# Patient Record
Sex: Female | Born: 1985 | Race: Black or African American | Hispanic: No | Marital: Single | Smoking: Never smoker
Health system: Southern US, Community
[De-identification: ages and names within clinical notes are randomized; demographics above are authoritative.]

## PROBLEM LIST (undated history)

## (undated) ENCOUNTER — Inpatient Hospital Stay (HOSPITAL_COMMUNITY): Payer: Self-pay

## (undated) DIAGNOSIS — R599 Enlarged lymph nodes, unspecified: Secondary | ICD-10-CM

## (undated) DIAGNOSIS — D649 Anemia, unspecified: Secondary | ICD-10-CM

## (undated) DIAGNOSIS — D219 Benign neoplasm of connective and other soft tissue, unspecified: Secondary | ICD-10-CM

## (undated) DIAGNOSIS — L0291 Cutaneous abscess, unspecified: Secondary | ICD-10-CM

## (undated) DIAGNOSIS — R519 Headache, unspecified: Secondary | ICD-10-CM

## (undated) DIAGNOSIS — R51 Headache: Secondary | ICD-10-CM

## (undated) DIAGNOSIS — N809 Endometriosis, unspecified: Secondary | ICD-10-CM

## (undated) DIAGNOSIS — D573 Sickle-cell trait: Secondary | ICD-10-CM

## (undated) DIAGNOSIS — R1904 Left lower quadrant abdominal swelling, mass and lump: Secondary | ICD-10-CM

## (undated) HISTORY — DX: Endometriosis, unspecified: N80.9

## (undated) HISTORY — DX: Cutaneous abscess, unspecified: L02.91

## (undated) HISTORY — DX: Headache: R51

## (undated) HISTORY — DX: Enlarged lymph nodes, unspecified: R59.9

## (undated) HISTORY — DX: Headache, unspecified: R51.9

## (undated) HISTORY — DX: Left lower quadrant abdominal swelling, mass and lump: R19.04

---

## 2006-07-03 HISTORY — PX: MYOMECTOMY: SHX85

## 2009-04-02 HISTORY — PX: APPENDECTOMY: SHX54

## 2011-07-31 ENCOUNTER — Encounter (INDEPENDENT_AMBULATORY_CARE_PROVIDER_SITE_OTHER): Payer: Self-pay | Admitting: Surgery

## 2011-07-31 ENCOUNTER — Ambulatory Visit (INDEPENDENT_AMBULATORY_CARE_PROVIDER_SITE_OTHER): Payer: Managed Care, Other (non HMO) | Admitting: Surgery

## 2011-07-31 VITALS — BP 100/64 | HR 62 | Temp 97.8°F | Resp 16 | Ht 65.75 in | Wt 137.8 lb

## 2011-07-31 DIAGNOSIS — N809 Endometriosis, unspecified: Secondary | ICD-10-CM

## 2011-07-31 DIAGNOSIS — R1904 Left lower quadrant abdominal swelling, mass and lump: Secondary | ICD-10-CM

## 2011-07-31 HISTORY — DX: Endometriosis, unspecified: N80.9

## 2011-07-31 HISTORY — DX: Left lower quadrant abdominal swelling, mass and lump: R19.04

## 2011-07-31 NOTE — Patient Instructions (Signed)
We suspect your have a mass of Endometriosis in your abdominal wall  Endometriosis is a disease that occurs when the endometrium (lining of the uterus) is misplaced outside of its normal location. It may occur in many locations close to the uterus (womb), but commonly on the ovaries, fallopian tubes, vagina (birth canal) and bowel located close to the uterus. Because the uterus sloughs (expels) its lining every month (menses), there is bleeding whereever the endometrial tissue is located. SYMPTOMS  Often there are no symptoms. However, because blood is irritating to tissues not normally exposed to it, when symptoms occur they vary with the location of the misplaced endometrium. Symptoms often include back and abdominal pain. Periods may be heavier and intercourse may be painful. Infertility may be present. You may have all of these symptoms at one time or another or you may have months with no symptoms at all. Although the symptoms occur mainly during menses, they can occur mid-cycle as well, and usually terminate with menopause. DIAGNOSIS  Your caregiver may recommend a blood test and urine test (urinalysis) to help rule out other conditions. Another common test is ultrasound, a painless procedure that uses sound waves to make a sonogram "picture" of abnormal tissue that could be endometriosis. If your bowel movements are painful around your periods, your caregiver may advise a barium enema (an X-ray of the lower bowel), to try to find the source of your pain. This is sometimes confirmed by laparoscopy. Laparoscopy is a procedure where your caregiver looks into your abdomen with a laparoscope (a small pencil sized telescope). Your caregiver may take a tiny piece of tissue (biopsy) from any abnormal tissue to confirm or document your problem. These tissues are sent to the lab and a pathologist looks at them under the microscope to give a microscopic diagnosis. TREATMENT  Once the diagnosis is made, it can be  treated by destruction of the misplaced endometrial tissue using heat (diathermy), laser, cutting (excision), or chemical means. It may also be treated with hormonal therapy. When using hormonal therapy menses are eliminated, therefore eliminating the monthly exposure to blood by the misplaced endometrial tissue. Only in severe cases is it necessary to perform a hysterectomy with removal of the tubes, uterus and ovaries. HOME CARE INSTRUCTIONS   Only take over-the-counter or prescription medicines for pain, discomfort, or fever as directed by your caregiver.   Avoid activities that produce pain, including a physical sexual relationship.   Do not take aspirin as this may increase bleeding when not on hormonal therapy.   See your caregiver for pain or problems not controlled with treatment.  SEEK IMMEDIATE MEDICAL CARE IF:   Your pain is severe and is not responding to pain medicine.   You develop severe nausea and vomiting, or you cannot keep foods down.   Your pain localizes to the right lower part of your abdomen (possible appendicitis).   You have swelling or increasing pain in the abdomen.   You have a fever.   You see blood in your stool.  MAKE SURE YOU:   Understand these instructions.   Will watch your condition.   Will get help right away if you are not doing well or get worse.  Document Released: 06/07/2000 Document Revised: 02/20/2011 Document Reviewed: 01/27/2008 [facility] Patient Information 2012 [place], [place].

## 2011-07-31 NOTE — Progress Notes (Signed)
Subjective:     Patient ID: Megan Coleman, female   DOB: 03/09/1986, 25 y.o.   MRN: [id number]  HPI  Megan Coleman  12/09/1985 [id number]  Patient Care Team: Cammie J Fulp as PCP - General (Family Medicine)  This patient is a 26 y.o.female who presents today for surgical evaluation at the request of Dr. Fulp.   Reason for visit: Painful mass on the left lower quadrant of abdomen.  The patient is a healthy thin female originally from [place]. She's been here for the past year. She notes she felt a lump in her left lower side a few years ago. Never had any drainage or infection. Has gradually increased in size. It is worse with activity and sneezing. It is worse with her menstrual periods. She  She did have a removal of a fibroid and she was [place]. She had an appendectomy through a separate right lower quadrant incision. No fevers or chills. Energy level otherwise good. No menstrual bleeding. No fevers or chills. No history of hernias. No history of wound infections.  Because the mass had gotten larger become more comfortable, she decided to come in and be seen. Dr. Fulp was concerned and requested a surgical evaluation  Patient Active Problem List  Diagnoses  . Abdominal wall mass of left lower quadrant, possible endometrioma    Past Medical History  Diagnosis Date  . Abscess     lower left abdomen/groin area  . Abdominal pain   . Generalized headaches   . Lymph nodes enlarged   . Abdominal wall mass of left lower quadrant, possible endometrioma 07/31/2011    Past Surgical History  Procedure Date  . Appendectomy 04/02/2009    In [place]  . Myomectomy 07/03/2006    In [place]    History   Social History  . Marital Status: Single    Spouse Name: N/A    Number of Children: N/A  . Years of Education: N/A   Occupational History  . Not on file.   Social History Main Topics  . Smoking status: Never Smoker   . Smokeless tobacco: Never Used    . Alcohol Use: No  . Drug Use: No  . Sexually Active: Not on file   Other Topics Concern  . Not on file   Social History Narrative   Originally from [place], [place]    History reviewed. No pertinent family history.  No current outpatient prescriptions on file.  No Known Allergies  BP 100/64  Pulse 62  Temp(Src) 97.8 F (36.6 C) (Temporal)  Resp 16  Ht 5' 5.75" (1.67 m)  Wt 137 lb 12.8 oz (62.506 kg)  BMI 22.41 kg/m2     Review of Systems  Constitutional: Negative for fever, chills, diaphoresis, appetite change and fatigue.  HENT: Negative for ear pain, sore throat, trouble swallowing, neck pain and ear discharge.   Eyes: Negative for photophobia, discharge and visual disturbance.  Respiratory: Negative for cough, choking, chest tightness and shortness of breath.   Cardiovascular: Negative for chest pain and palpitations.  Gastrointestinal: Positive for abdominal pain. Negative for nausea, vomiting, diarrhea, constipation, blood in stool, abdominal distention, anal bleeding and rectal pain.  Genitourinary: Negative for dysuria, frequency and difficulty urinating.  Musculoskeletal: Negative for myalgias and gait problem.  Skin: Negative for color change, pallor and rash.  Neurological: Negative for dizziness, speech difficulty, weakness and numbness.  Hematological: Negative for adenopathy.  Psychiatric/Behavioral: Negative for confusion and agitation. The patient is not   nervous/anxious.        Objective:   Physical Exam  Constitutional: She is oriented to person, place, and time. She appears well-developed and well-nourished. No distress.  HENT:  Head: Normocephalic.  Mouth/Throat: Oropharynx is clear and moist. No oropharyngeal exudate.  Eyes: Conjunctivae and EOM are normal. Pupils are equal, round, and reactive to light. No scleral icterus.  Neck: Normal range of motion. Neck supple. No tracheal deviation present.  Cardiovascular: Normal rate, regular  rhythm and intact distal pulses.   Pulmonary/Chest: Effort normal and breath sounds normal. No respiratory distress. She exhibits no tenderness.  Abdominal: Soft. She exhibits mass. She exhibits no distension. There is tenderness. There is no rebound and no guarding. Hernia confirmed negative in the right inguinal area and confirmed negative in the left inguinal area.    Genitourinary: No vaginal discharge found.  Musculoskeletal: Normal range of motion. She exhibits no tenderness.  Lymphadenopathy:    She has no cervical adenopathy.       Right: No inguinal adenopathy present.       Left: No inguinal adenopathy present.  Neurological: She is alert and oriented to person, place, and time. No cranial nerve deficit. She exhibits normal muscle tone. Coordination normal.  Skin: Skin is warm and dry. No rash noted. She is not diaphoretic. No erythema.  Psychiatric: She has a normal mood and affect. Her behavior is normal. Judgment and thought content normal.       Assessment:     LLQ abdominal wall mass.  Probable endometrioma vs incarcerated VWH    Plan:     I would start with diagnostic laparoscopy to rule out an incarcerated hernia and no other pathology. If it is negative as is suspect, then I would proceed with cutdown on the mass and removal. Given that it is firm, I suspect some fascia / muscle will need to be removed and she will need abdominal reconstruction with mesh if it turns out to be an endometrioma or similar mass.  The pathophysiology of skin & abdominal wall masses was discussed.  Natural history risks without surgery were discussed.  I recommended surgery to remove the mass.  I explained the technique of removal with use of local anesthesia & more aggressive sedation/anesthesia for patient comfort.   Possible abd wall reconstruction w mesh discussed as well if fascia has to be removed.  Risks such as bleeding, infection, heart attack, death, and other risks were discussed.  I  noted a good likelihood this will help address the problem.   Possibility that this will not correct all symptoms was explained. Possibility of regrowth/recurrence of the mass was discussed.  We will work to minimize complications. Questions were answered.  The patient expresses understanding & wishes to proceed with surgery.          

## 2011-08-12 ENCOUNTER — Ambulatory Visit (HOSPITAL_COMMUNITY)
Admission: RE | Admit: 2011-08-12 | Discharge: 2011-08-12 | Disposition: A | Discharge status: Home / Self Care | Payer: Managed Care, Other (non HMO) | Source: Ambulatory Visit | Attending: Surgery | Admitting: Surgery

## 2011-08-12 ENCOUNTER — Encounter (HOSPITAL_COMMUNITY): Payer: Self-pay | Admitting: Pharmacy Technician

## 2011-08-12 ENCOUNTER — Encounter (HOSPITAL_COMMUNITY): Payer: Self-pay

## 2011-08-12 DIAGNOSIS — R1903 Right lower quadrant abdominal swelling, mass and lump: Secondary | ICD-10-CM | POA: Insufficient documentation

## 2011-08-12 DIAGNOSIS — Z01812 Encounter for preprocedural laboratory examination: Secondary | ICD-10-CM | POA: Insufficient documentation

## 2011-08-12 DIAGNOSIS — R1904 Left lower quadrant abdominal swelling, mass and lump: Secondary | ICD-10-CM | POA: Insufficient documentation

## 2011-08-12 LAB — CBC
Hemoglobin: 13.2 g/dL (ref 12.0–15.0)
MCH: 27 pg (ref 26.0–34.0)
MCHC: 33.4 g/dL (ref 30.0–36.0)
Platelets: 189 10*3/uL (ref 150–400)
RDW: 13.5 % (ref 11.5–15.5)

## 2011-08-12 LAB — HCG, SERUM, QUALITATIVE: Preg, Serum: NEGATIVE

## 2011-08-12 LAB — SURGICAL PCR SCREEN
MRSA, PCR: NEGATIVE
Staphylococcus aureus: POSITIVE — AB

## 2011-08-12 NOTE — Patient Instructions (Signed)
21 Megan Coleman  08/12/2011   Your procedure is scheduled on: 08-15-11  Report to [facility] at  0530 AM.  Call this number if you have problems the morning of surgery: [phone]   Remember:   Do not eat food or drink liquids:After Midnight.  Take these medicines the morning of surgery with A SIP OF WATER: no meds to take   Do not wear jewelry...  Do not wear lotions, powders, or perfumes. Do not wear deodorant.  .  Do not bring valuables to the hospital.  Contacts, dentures or bridgework may not be worn into surgery.  Leave suitcase in the car. After surgery it may be brought to your room.  For patients admitted to the hospital, checkout time is 11:00 AM the day of discharge.     Special Instructions: CHG Shower Use Special Wash: 1/2 bottle night before surgery and 1/2 bottle morning of surgery.neck down avoid private area,  No more shaving   Please read over the following fact sheets that you were given: MRSA Information  [name] [name] rn [facility] pre op nurse phone number [phone] call if needed

## 2011-08-28 ENCOUNTER — Encounter (HOSPITAL_COMMUNITY): Payer: Self-pay | Admitting: Certified Registered Nurse Anesthetist

## 2011-08-28 ENCOUNTER — Encounter (HOSPITAL_COMMUNITY)
Admission: RE | Disposition: A | Discharge status: Home / Self Care | Payer: Self-pay | Source: Ambulatory Visit | Attending: Surgery

## 2011-08-28 ENCOUNTER — Ambulatory Visit (HOSPITAL_COMMUNITY)
Admission: RE | Admit: 2011-08-28 | Discharge: 2011-08-29 | Disposition: A | Discharge status: Home / Self Care | Payer: Managed Care, Other (non HMO) | Source: Ambulatory Visit | Attending: Surgery | Admitting: Surgery

## 2011-08-28 ENCOUNTER — Ambulatory Visit (HOSPITAL_COMMUNITY): Payer: Managed Care, Other (non HMO) | Admitting: Certified Registered Nurse Anesthetist

## 2011-08-28 DIAGNOSIS — R1901 Right upper quadrant abdominal swelling, mass and lump: Secondary | ICD-10-CM | POA: Insufficient documentation

## 2011-08-28 DIAGNOSIS — R1032 Left lower quadrant pain: Secondary | ICD-10-CM | POA: Insufficient documentation

## 2011-08-28 DIAGNOSIS — N808 Other endometriosis: Secondary | ICD-10-CM | POA: Insufficient documentation

## 2011-08-28 DIAGNOSIS — D214 Benign neoplasm of connective and other soft tissue of abdomen: Secondary | ICD-10-CM | POA: Insufficient documentation

## 2011-08-28 DIAGNOSIS — R1904 Left lower quadrant abdominal swelling, mass and lump: Secondary | ICD-10-CM | POA: Insufficient documentation

## 2011-08-28 DIAGNOSIS — K439 Ventral hernia without obstruction or gangrene: Secondary | ICD-10-CM

## 2011-08-28 DIAGNOSIS — N806 Endometriosis in cutaneous scar: Secondary | ICD-10-CM

## 2011-08-28 DIAGNOSIS — N809 Endometriosis, unspecified: Secondary | ICD-10-CM | POA: Diagnosis present

## 2011-08-28 HISTORY — PX: LAPAROSCOPY: SHX197

## 2011-08-28 HISTORY — PX: MASS EXCISION: SHX2000

## 2011-08-28 HISTORY — PX: VENTRAL HERNIA REPAIR: SHX424

## 2011-08-28 LAB — CBC
MCH: 25.7 pg — ABNORMAL LOW (ref 26.0–34.0)
MCHC: 31.9 g/dL (ref 30.0–36.0)
MCV: 80.6 fL (ref 78.0–100.0)
Platelets: 170 10*3/uL (ref 150–400)
RBC: 3.77 MIL/uL — ABNORMAL LOW (ref 3.87–5.11)

## 2011-08-28 LAB — CREATININE, SERUM: Creatinine, Ser: 0.72 mg/dL (ref 0.50–1.10)

## 2011-08-28 SURGERY — LAPAROSCOPY, DIAGNOSTIC
Anesthesia: General | Site: Abdomen | Wound class: Clean

## 2011-08-28 MED ORDER — ACETAMINOPHEN 10 MG/ML IV SOLN
INTRAVENOUS | Status: DC | PRN
  Administered 2011-08-28: 1000 mg via INTRAVENOUS

## 2011-08-28 MED ORDER — LACTATED RINGERS IV SOLN
INTRAVENOUS | Status: DC | PRN
  Administered 2011-08-28: 09:00:00 via INTRAVENOUS

## 2011-08-28 MED ORDER — BUPIVACAINE-EPINEPHRINE PF 0.25-1:200000 % IJ SOLN
INTRAMUSCULAR | Status: AC
  Filled 2011-08-28: qty 30

## 2011-08-28 MED ORDER — MAGNESIUM HYDROXIDE 400 MG/5ML PO SUSP: 30.0000 mL | Freq: Three times a day (TID) | ORAL | Status: DC | PRN

## 2011-08-28 MED ORDER — KETOROLAC TROMETHAMINE 30 MG/ML IJ SOLN: 15.0000 mg | Freq: Once | INTRAMUSCULAR | Status: DC | PRN

## 2011-08-28 MED ORDER — ALUM & MAG HYDROXIDE-SIMETH 200-200-20 MG/5ML PO SUSP: 30.0000 mL | Freq: Four times a day (QID) | ORAL | Status: DC | PRN

## 2011-08-28 MED ORDER — CEFAZOLIN SODIUM 1-5 GM-% IV SOLN
1.0000 g | INTRAVENOUS | Status: AC
  Administered 2011-08-28: 1 g via INTRAVENOUS

## 2011-08-28 MED ORDER — SUCCINYLCHOLINE CHLORIDE 20 MG/ML IJ SOLN
INTRAMUSCULAR | Status: DC | PRN
  Administered 2011-08-28: 100 mg via INTRAVENOUS

## 2011-08-28 MED ORDER — HEPARIN SODIUM (PORCINE) 5000 UNIT/ML IJ SOLN
5000.0000 [IU] | Freq: Three times a day (TID) | INTRAMUSCULAR | Status: DC
  Administered 2011-08-28 – 2011-08-29 (×3): 5000 [IU] via SUBCUTANEOUS
  Filled 2011-08-28 (×8): qty 1

## 2011-08-28 MED ORDER — ONDANSETRON HCL 4 MG/2ML IJ SOLN
INTRAMUSCULAR | Status: DC | PRN
  Administered 2011-08-28: 4 mg via INTRAVENOUS

## 2011-08-28 MED ORDER — IBUPROFEN 800 MG PO TABS
800.0000 mg | ORAL_TABLET | Freq: Four times a day (QID) | ORAL | Status: DC
  Administered 2011-08-28 – 2011-08-29 (×5): 800 mg via ORAL
  Filled 2011-08-28 (×10): qty 1

## 2011-08-28 MED ORDER — PROMETHAZINE HCL 25 MG/ML IJ SOLN: 6.2500 mg | INTRAMUSCULAR | Status: DC | PRN

## 2011-08-28 MED ORDER — HETASTARCH-ELECTROLYTES 6 % IV SOLN
INTRAVENOUS | Status: DC | PRN
  Administered 2011-08-28: 10:00:00 via INTRAVENOUS

## 2011-08-28 MED ORDER — BUPIVACAINE-EPINEPHRINE 0.25% -1:200000 IJ SOLN
INTRAMUSCULAR | Status: DC | PRN
  Administered 2011-08-28: 60 mL

## 2011-08-28 MED ORDER — ONDANSETRON HCL 4 MG/2ML IJ SOLN: 4.0000 mg | Freq: Four times a day (QID) | INTRAMUSCULAR | Status: DC | PRN

## 2011-08-28 MED ORDER — VITAMIN D3 50 MCG (2000 UT) PO TABS: 1.0000 | ORAL_TABLET | Freq: Every day | ORAL | Status: DC

## 2011-08-28 MED ORDER — OXYCODONE HCL 5 MG PO TABS
5.0000 mg | ORAL_TABLET | ORAL | Status: DC | PRN
  Administered 2011-08-28 – 2011-08-29 (×5): 5 mg via ORAL
  Filled 2011-08-28 (×5): qty 1

## 2011-08-28 MED ORDER — LACTATED RINGERS IV SOLN
INTRAVENOUS | Status: DC
  Administered 2011-08-28 – 2011-08-29 (×2): via INTRAVENOUS

## 2011-08-28 MED ORDER — DIPHENHYDRAMINE HCL 50 MG/ML IJ SOLN
12.5000 mg | Freq: Four times a day (QID) | INTRAMUSCULAR | Status: DC | PRN
Start: 2011-08-28 — End: 2011-08-29

## 2011-08-28 MED ORDER — 0.9 % SODIUM CHLORIDE (POUR BTL) OPTIME
TOPICAL | Status: DC | PRN
  Administered 2011-08-28: 1000 mL

## 2011-08-28 MED ORDER — NEOSTIGMINE METHYLSULFATE 1 MG/ML IJ SOLN
INTRAMUSCULAR | Status: DC | PRN
  Administered 2011-08-28: 5 mg via INTRAVENOUS

## 2011-08-28 MED ORDER — VITAMIN D3 25 MCG (1000 UNIT) PO TABS
2000.0000 [IU] | ORAL_TABLET | Freq: Every day | ORAL | Status: DC
  Administered 2011-08-28 – 2011-08-29 (×2): 2000 [IU] via ORAL
  Filled 2011-08-28 (×3): qty 2

## 2011-08-28 MED ORDER — ROCURONIUM BROMIDE 100 MG/10ML IV SOLN
INTRAVENOUS | Status: DC | PRN
  Administered 2011-08-28: 40 mg via INTRAVENOUS
  Administered 2011-08-28: 5 mg via INTRAVENOUS
  Administered 2011-08-28: 10 mg via INTRAVENOUS
  Administered 2011-08-28: 5 mg via INTRAVENOUS

## 2011-08-28 MED ORDER — BUPIVACAINE 0.25 % ON-Q PUMP DUAL CATH 300 ML
300.0000 mL | INJECTION | Status: DC
  Filled 2011-08-28: qty 300

## 2011-08-28 MED ORDER — CEFAZOLIN SODIUM 1-5 GM-% IV SOLN
INTRAVENOUS | Status: AC
  Filled 2011-08-28: qty 50

## 2011-08-28 MED ORDER — HYDROMORPHONE HCL PF 1 MG/ML IJ SOLN
INTRAMUSCULAR | Status: DC | PRN
  Administered 2011-08-28 (×4): 0.5 mg via INTRAVENOUS

## 2011-08-28 MED ORDER — KETOROLAC TROMETHAMINE 30 MG/ML IJ SOLN
INTRAMUSCULAR | Status: DC | PRN
  Administered 2011-08-28: 30 mg via INTRAVENOUS

## 2011-08-28 MED ORDER — INFLUENZA VIRUS VACC SPLIT PF IM SUSP
0.5000 mL | INTRAMUSCULAR | Status: DC | PRN
Start: 2011-08-29 — End: 2011-08-29

## 2011-08-28 MED ORDER — ACETAMINOPHEN 10 MG/ML IV SOLN
INTRAVENOUS | Status: AC
  Filled 2011-08-28: qty 100

## 2011-08-28 MED ORDER — DROPERIDOL 2.5 MG/ML IJ SOLN
INTRAMUSCULAR | Status: DC | PRN
  Administered 2011-08-28: 0.625 mg via INTRAVENOUS

## 2011-08-28 MED ORDER — HYDROMORPHONE BOLUS VIA INFUSION
0.5000 mg | INTRAVENOUS | Status: DC | PRN
  Filled 2011-08-28: qty 2

## 2011-08-28 MED ORDER — GLYCOPYRROLATE 0.2 MG/ML IJ SOLN
INTRAMUSCULAR | Status: DC | PRN
  Administered 2011-08-28: .7 mg via INTRAVENOUS

## 2011-08-28 MED ORDER — BUPIVACAINE 0.5 % ON-Q PUMP DUAL CATH 300 ML
300.0000 mL | INJECTION | Status: DC
  Filled 2011-08-28: qty 300

## 2011-08-28 MED ORDER — LIDOCAINE HCL (CARDIAC) 20 MG/ML IV SOLN
INTRAVENOUS | Status: DC | PRN
  Administered 2011-08-28: 100 mg via INTRAVENOUS

## 2011-08-28 MED ORDER — MIDAZOLAM HCL 5 MG/5ML IJ SOLN
INTRAMUSCULAR | Status: DC | PRN
  Administered 2011-08-28: 2 mg via INTRAVENOUS

## 2011-08-28 MED ORDER — BUPIVACAINE 0.25 % ON-Q PUMP DUAL CATH 300 ML: 300.0000 mL | INJECTION | Status: DC

## 2011-08-28 MED ORDER — PROPOFOL 10 MG/ML IV EMUL
INTRAVENOUS | Status: DC | PRN
  Administered 2011-08-28: 150 mg via INTRAVENOUS

## 2011-08-28 MED ORDER — PSYLLIUM 95 % PO PACK
1.0000 | PACK | Freq: Two times a day (BID) | ORAL | Status: DC
  Administered 2011-08-29: 1 via ORAL
  Filled 2011-08-28 (×5): qty 1

## 2011-08-28 MED ORDER — DEXAMETHASONE SODIUM PHOSPHATE 10 MG/ML IJ SOLN
INTRAMUSCULAR | Status: DC | PRN
  Administered 2011-08-28: 10 mg via INTRAVENOUS

## 2011-08-28 MED ORDER — FENTANYL CITRATE 0.05 MG/ML IJ SOLN
INTRAMUSCULAR | Status: DC | PRN
  Administered 2011-08-28 (×5): 50 ug via INTRAVENOUS

## 2011-08-28 MED ORDER — HYDROMORPHONE HCL PF 1 MG/ML IJ SOLN: 0.2500 mg | INTRAMUSCULAR | Status: DC | PRN

## 2011-08-28 SURGICAL SUPPLY — 68 items
APPLIER CLIP ROT 10 11.4 M/L (STAPLE)
BENZOIN TINCTURE PRP APPL 2/3 (GAUZE/BANDAGES/DRESSINGS) IMPLANT
BINDER ABD UNIV 12 45-62 (WOUND CARE) IMPLANT
BINDER ABD UNIV 9 30-45 (GAUZE/BANDAGES/DRESSINGS) ×1 IMPLANT
BINDER ABDOMINAL 46IN 62IN (WOUND CARE)
BINDER ABDOMINAL 9 (GAUZE/BANDAGES/DRESSINGS) ×2
CANISTER SUCTION 2500CC (MISCELLANEOUS) ×2 IMPLANT
CATH FOLEY 3WAY  5CC 16FR (CATHETERS)
CATH FOLEY 3WAY 5CC 16FR (CATHETERS) IMPLANT
CATH KIT ON Q 10IN SLV (PAIN MANAGEMENT) ×4 IMPLANT
CHLORAPREP W/TINT 26ML (MISCELLANEOUS) ×2 IMPLANT
CLIP APPLIE ROT 10 11.4 M/L (STAPLE) IMPLANT
CLOSURE STERI STRIP 1/2 X4 (GAUZE/BANDAGES/DRESSINGS) ×2 IMPLANT
CLOTH BEACON ORANGE TIMEOUT ST (SAFETY) ×2 IMPLANT
CORD HIGH FREQUENCY UNIPOLAR (ELECTROSURGICAL) ×2 IMPLANT
DECANTER SPIKE VIAL GLASS SM (MISCELLANEOUS) IMPLANT
DRAPE LAPAROSCOPIC ABDOMINAL (DRAPES) ×2 IMPLANT
DRSG TEGADERM 2-3/8X2-3/4 SM (GAUZE/BANDAGES/DRESSINGS) ×4 IMPLANT
DRSG TEGADERM 4X4.75 (GAUZE/BANDAGES/DRESSINGS) ×2 IMPLANT
ELECT REM PT RETURN 9FT ADLT (ELECTROSURGICAL)
ELECTRODE REM PT RTRN 9FT ADLT (ELECTROSURGICAL) IMPLANT
ENDOLOOP SUT PDS II  0 18 (SUTURE)
ENDOLOOP SUT PDS II 0 18 (SUTURE) IMPLANT
GAUZE SPONGE 2X2 8PLY STRL LF (GAUZE/BANDAGES/DRESSINGS) IMPLANT
GLOVE BIOGEL PI IND STRL 7.0 (GLOVE) ×1 IMPLANT
GLOVE BIOGEL PI INDICATOR 7.0 (GLOVE) ×1
GLOVE ECLIPSE 8.0 STRL XLNG CF (GLOVE) ×2 IMPLANT
GLOVE INDICATOR 8.0 STRL GRN (GLOVE) ×2 IMPLANT
GOWN STRL NON-REIN LRG LVL3 (GOWN DISPOSABLE) ×2 IMPLANT
GOWN STRL REIN XL XLG (GOWN DISPOSABLE) ×4 IMPLANT
HAND ACTIVATED (MISCELLANEOUS) IMPLANT
KIT BASIN OR (CUSTOM PROCEDURE TRAY) ×2 IMPLANT
MESH ULTRAPRO 6X6 15CM15CM (Mesh General) ×2 IMPLANT
NEEDLE SPNL 22GX3.5 QUINCKE BK (NEEDLE) IMPLANT
NS IRRIG 1000ML POUR BTL (IV SOLUTION) ×2 IMPLANT
PACK GENERAL/GYN (CUSTOM PROCEDURE TRAY) ×2 IMPLANT
PEN SKIN MARKING BROAD (MISCELLANEOUS) ×2 IMPLANT
PENCIL BUTTON HOLSTER BLD 10FT (ELECTRODE) ×2 IMPLANT
POUCH SPECIMEN RETRIEVAL 10MM (ENDOMECHANICALS) IMPLANT
SCISSORS LAP 5X35 DISP (ENDOMECHANICALS) IMPLANT
SET IRRIG TUBING LAPAROSCOPIC (IRRIGATION / IRRIGATOR) IMPLANT
SLEEVE Z-THREAD 5X100MM (TROCAR) IMPLANT
SOLUTION ANTI FOG 6CC (MISCELLANEOUS) IMPLANT
SPONGE GAUZE 2X2 STER 10/PKG (GAUZE/BANDAGES/DRESSINGS)
SPONGE GAUZE 4X4 FOR O.R. (GAUZE/BANDAGES/DRESSINGS) ×2 IMPLANT
SPONGE LAP 18X18 X RAY DECT (DISPOSABLE) ×2 IMPLANT
SPONGE LAP 4X18 X RAY DECT (DISPOSABLE) ×4 IMPLANT
STRIP CLOSURE SKIN 1/2X4 (GAUZE/BANDAGES/DRESSINGS) IMPLANT
SUT ETHIBOND NAB CT1 #1 30IN (SUTURE) IMPLANT
SUT MNCRL AB 4-0 PS2 18 (SUTURE) ×2 IMPLANT
SUT NOVA NAB DX-16 0-1 5-0 T12 (SUTURE) IMPLANT
SUT PROLENE 0 CT 1 CR/8 (SUTURE) ×6 IMPLANT
SUT PROLENE 1 CT 1 30 (SUTURE) IMPLANT
SUT SILK 0 SH 30 (SUTURE) ×2 IMPLANT
SUT VIC AB 2-0 UR6 27 (SUTURE) IMPLANT
SUT VIC AB 3-0 SH 18 (SUTURE) ×4 IMPLANT
SUT VICRYL 0 UR6 27IN ABS (SUTURE) ×4 IMPLANT
SYRINGE 10CC LL (SYRINGE) IMPLANT
TACKER 5MM HERNIA 3.5CML NAB (ENDOMECHANICALS) IMPLANT
TOWEL OR 17X26 10 PK STRL BLUE (TOWEL DISPOSABLE) ×2 IMPLANT
TRAY FOLEY CATH 14FRSI W/METER (CATHETERS) ×2 IMPLANT
TRAY LAP CHOLE (CUSTOM PROCEDURE TRAY) ×2 IMPLANT
TROCAR Z-THREAD FIOS 12X100MM (TROCAR) IMPLANT
TROCAR Z-THREAD FIOS 5X100MM (TROCAR) ×4 IMPLANT
TROCAR Z-THREAD SLEEVE 11X100 (TROCAR) IMPLANT
TUBING INSUFFLATION 10FT LAP (TUBING) ×2 IMPLANT
TUNNELER SHEATH ON-Q 16GX12 DP (PAIN MANAGEMENT) ×2 IMPLANT
YANKAUER SUCT BULB TIP NO VENT (SUCTIONS) ×2 IMPLANT

## 2011-08-28 NOTE — Brief Op Note (Addendum)
08/28/2011  11:51 AM  PATIENT:  Megan Coleman  26 y.o. female  Patient Care Team: Cammie J Fulp as PCP - General (Family Medicine)  PRE-OPERATIVE DIAGNOSIS:  left low quarant LLQ abdominal wall mass three cm   POST-OPERATIVE DIAGNOSIS:  Bilateral  lower abdominal wall masses x 2  PROCEDURE:  Procedure(s) (LRB): LAPAROSCOPY DIAGNOSTIC (N/A) EXCISION MASS (N/A) HERNIA REPAIR VENTRAL ADULT (N/A) INSERTION OF MESH (N/A)  SURGEON:  Surgeon(s) and Role:    * Ardeth Sportsman, MD - Primary  PHYSICIAN ASSISTANT:   ASSISTANTS: none   ANESTHESIA:   local and general  EBL:  Total I/O In: 2500 [I.V.:2000; IV Piggyback:500] Out: 550 [Urine:400; Other:50; Blood:100]  BLOOD ADMINISTERED:none  DRAINS: (19) Blake drain(s) in the Abdominal wall   LOCAL MEDICATIONS USED:  BUPIVICAINE   SPECIMEN:  Source of Specimen:  1.  LLQ abd wall mass.  2. RLQ abd wall mass  DISPOSITION OF SPECIMEN:  PATHOLOGY  COUNTS:  YES  TOURNIQUET:  * No tourniquets in log *  DICTATION: .Other Dictation: Dictation Number [id number]  PLAN OF CARE: Admit for overnight observation  PATIENT DISPOSITION:  PACU - hemodynamically stable.   Delay start of Pharmacological VTE agent (>24hrs) due to surgical blood loss or risk of bleeding: no  I discussed the patient's status to the family.  Questions were answered.  They expressed understanding & appreciation.

## 2011-08-28 NOTE — Anesthesia Preprocedure Evaluation (Signed)

## 2011-08-28 NOTE — Transfer of Care (Signed)
Immediate Anesthesia Transfer of Care Note  Patient: Megan Coleman  Procedure(s) Performed: Procedure(s) (LRB): LAPAROSCOPY DIAGNOSTIC (N/A) EXCISION MASS (N/A) HERNIA REPAIR VENTRAL ADULT (N/A) INSERTION OF MESH (N/A)  Patient Location: PACU  Anesthesia Type: General  Level of Consciousness: sedated, patient cooperative and responds to stimulaton  Airway & Oxygen Therapy: Patient Spontanous Breathing and Patient connected to face mask oxgen  Post-op Assessment: Report given to PACU RN and Post -op Vital signs reviewed and stable  Post vital signs: Reviewed and stable  Complications: No apparent anesthesia complications

## 2011-08-28 NOTE — Interval H&P Note (Signed)
History and Physical Interval Note:  08/28/2011 8:31 AM  Megan Coleman  has presented today for surgery, with the diagnosis of left low quarant LLQ abdominal wall mass three cm   The various methods of treatment have been discussed with the patient and family. After consideration of risks, benefits and other options for treatment, the patient has consented to  Procedure(s) (LRB): LAPAROSCOPY DIAGNOSTIC (N/A) EXCISION MASS (N/A) HERNIA REPAIR VENTRAL ADULT (N/A) INSERTION OF MESH (N/A) as a surgical intervention .  The patients' history has been reviewed, patient examined, no change in status, stable for surgery.  I have reviewed the patients' chart and labs.  Questions were answered to the patient's satisfaction.     Ryatt Corsino C.

## 2011-08-28 NOTE — Anesthesia Postprocedure Evaluation (Signed)
  Anesthesia Post-op Note  Patient: Megan Coleman  Procedure(s) Performed: Procedure(s) (LRB): LAPAROSCOPY DIAGNOSTIC (N/A) EXCISION MASS (N/A) HERNIA REPAIR VENTRAL ADULT (N/A) INSERTION OF MESH (N/A)  Patient Location: PACU  Anesthesia Type: General  Level of Consciousness: awake and alert   Airway and Oxygen Therapy: Patient Spontanous Breathing  Post-op Pain: mild  Post-op Assessment: Post-op Vital signs reviewed, Patient's Cardiovascular Status Stable, Respiratory Function Stable, Patent Airway and No signs of Nausea or vomiting  Post-op Vital Signs: stable  Complications: No apparent anesthesia complications

## 2011-08-28 NOTE — H&P (View-Only) (Signed)
Subjective:     Patient ID: Megan Coleman, female   DOB: 1986/05/03, 26 y.o.   MRN: [id number]  HPI  Megan Coleman  05-Jun-1986 [id number]  Patient Care Team: Cammie J Fulp as PCP - General (Family Medicine)  This patient is a 26 y.o.female who presents today for surgical evaluation at the request of Dr. Jillyn Hidden.   Reason for visit: Painful mass on the left lower quadrant of abdomen.  The patient is a healthy thin female [place] from [place]. She's been here for the past year. She notes she felt a lump in her left lower side a few years ago. Never had any drainage or infection. Has gradually increased in size. It is worse with activity and sneezing. It is worse with her menstrual periods. She  She did have a removal of a fibroid and she was [place]. She had an appendectomy through a separate right lower quadrant incision. No fevers or chills. Energy level otherwise good. No menstrual bleeding. No fevers or chills. No history of hernias. No history of wound infections.  Because the mass had gotten larger become more comfortable, she decided to come in and be seen. Dr. Jillyn Hidden was concerned and requested a surgical evaluation  Patient Active Problem List  Diagnoses  . Abdominal wall mass of left lower quadrant, possible endometrioma    Past Medical History  Diagnosis Date  . Abscess     lower left abdomen/groin area  . Abdominal pain   . Generalized headaches   . Lymph nodes enlarged   . Abdominal wall mass of left lower quadrant, possible endometrioma 07/31/2011    Past Surgical History  Procedure Date  . Appendectomy 04/02/2009    In [place]  . Myomectomy 07/03/2006    In [place]    History   Social History  . Marital Status: Single    Spouse Name: N/A    Number of Children: N/A  . Years of Education: N/A   Occupational History  . Not on file.   Social History Main Topics  . Smoking status: Never Smoker   . Smokeless tobacco: Never Used    . Alcohol Use: No  . Drug Use: No  . Sexually Active: Not on file   Other Topics Concern  . Not on file   Social History Narrative   Originally from [place]    History reviewed. No pertinent family history.  No current outpatient prescriptions on file.  No Known Allergies  BP 100/64  Pulse 62  Temp(Src) 97.8 F (36.6 C) (Temporal)  Resp 16  Ht 5' 5.75" (1.67 m)  Wt 137 lb 12.8 oz (62.506 kg)  BMI 22.41 kg/m2     Review of Systems  Constitutional: Negative for fever, chills, diaphoresis, appetite change and fatigue.  HENT: Negative for ear pain, sore throat, trouble swallowing, neck pain and ear discharge.   Eyes: Negative for photophobia, discharge and visual disturbance.  Respiratory: Negative for cough, choking, chest tightness and shortness of breath.   Cardiovascular: Negative for chest pain and palpitations.  Gastrointestinal: Positive for abdominal pain. Negative for nausea, vomiting, diarrhea, constipation, blood in stool, abdominal distention, anal bleeding and rectal pain.  Genitourinary: Negative for dysuria, frequency and difficulty urinating.  Musculoskeletal: Negative for myalgias and gait problem.  Skin: Negative for color change, pallor and rash.  Neurological: Negative for dizziness, speech difficulty, weakness and numbness.  Hematological: Negative for adenopathy.  Psychiatric/Behavioral: Negative for confusion and agitation. The patient is not  nervous/anxious.        Objective:   Physical Exam  Constitutional: She is oriented to person, place, and time. She appears well-developed and well-nourished. No distress.  HENT:  Head: Normocephalic.  Mouth/Throat: Oropharynx is clear and moist. No oropharyngeal exudate.  Eyes: Conjunctivae and EOM are normal. Pupils are equal, round, and reactive to light. No scleral icterus.  Neck: Normal range of motion. Neck supple. No tracheal deviation present.  Cardiovascular: Normal rate, regular  rhythm and intact distal pulses.   Pulmonary/Chest: Effort normal and breath sounds normal. No respiratory distress. She exhibits no tenderness.  Abdominal: Soft. She exhibits mass. She exhibits no distension. There is tenderness. There is no rebound and no guarding. Hernia confirmed negative in the right inguinal area and confirmed negative in the left inguinal area.    Genitourinary: No vaginal discharge found.  Musculoskeletal: Normal range of motion. She exhibits no tenderness.  Lymphadenopathy:    She has no cervical adenopathy.       Right: No inguinal adenopathy present.       Left: No inguinal adenopathy present.  Neurological: She is alert and oriented to person, place, and time. No cranial nerve deficit. She exhibits normal muscle tone. Coordination normal.  Skin: Skin is warm and dry. No rash noted. She is not diaphoretic. No erythema.  Psychiatric: She has a normal mood and affect. Her behavior is normal. Judgment and thought content normal.       Assessment:     LLQ abdominal wall mass.  Probable endometrioma vs incarcerated VWH    Plan:     I would start with diagnostic laparoscopy to rule out an incarcerated hernia and no other pathology. If it is negative as is suspect, then I would proceed with cutdown on the mass and removal. Given that it is firm, I suspect some fascia / muscle will need to be removed and she will need abdominal reconstruction with mesh if it turns out to be an endometrioma or similar mass.  The pathophysiology of skin & abdominal wall masses was discussed.  Natural history risks without surgery were discussed.  I recommended surgery to remove the mass.  I explained the technique of removal with use of local anesthesia & more aggressive sedation/anesthesia for patient comfort.   Possible abd wall reconstruction w mesh discussed as well if fascia has to be removed.  Risks such as bleeding, infection, heart attack, death, and other risks were discussed.  I  noted a good likelihood this will help address the problem.   Possibility that this will not correct all symptoms was explained. Possibility of regrowth/recurrence of the mass was discussed.  We will work to minimize complications. Questions were answered.  The patient expresses understanding & wishes to proceed with surgery.

## 2011-08-28 NOTE — Progress Notes (Signed)
Removed foley catheter per dr. Magdalene Molly. 700 cc of clear yellow urine. Patient tolerated removal with no issues.

## 2011-08-28 NOTE — Op Note (Signed)
Megan Coleman, Megan NO.:  [id number]  MEDICAL RECORD NO.:  [id number]  LOCATION:  1529                         FACILITY:  [facility]  PHYSICIAN:  Ardeth Sportsman, MD     DATE OF BIRTH:  1986-06-07  DATE OF PROCEDURE:  08/28/2011 DATE OF DISCHARGE:                              OPERATIVE REPORT   PRIMARY CARE PHYSICIAN:  Cain Saupe, MD  SURGEON:  Ardeth Sportsman, MD  ASSIST:  RN.  PREOPERATIVE DIAGNOSIS:  Left lower quadrant abdominal wall mass, question incarcerated hernia versus endometrioma.  POSTOPERATIVE DIAGNOSES: 1. Left lower quadrant anterior abdominal wall mass, probable     endometrioma. 2. Right lower quadrant deep abdominal wall mass, probable     endometrioma.  PROCEDURES PERFORMED: 1. Diagnostic laparoscopy. 2. Excision of left lower quadrant abdominal wall mass. 3. Excision of right lower quadrant abdominal wall mass. 4. Abdominal wall reconstruction with mesh.  ANESTHESIA: 1. General anesthesia. 2. Local anesthetic and a field block on all port sites. 3. ON-Q continuous bupivacaine pain pump in the abdominal wall.  SPECIMENS: 1. Left lower quadrant abdominal wall mass. 2. Right lower quadrant abdominal wall mass.  DRAINS:  A 19-French Blake drain goes from the right mid abdomen into the subcutaneous tissues of the lower abdominal wall.  ESTIMATED BLOOD LOSS:  50 mL.  COMPLICATIONS:  None major.  INDICATIONS:  Megan Coleman is a 26 year old female, originally from [place].  She has had removal of fibroid when she lived there. She noticed a mass in her left lower side a few years ago.  It was noticed up on the skin.  It has gradually increased in size and it has become more uncomfortable.  Because of change in size and symptoms, she was sent to [place] for evaluation for possible hernia.  On examination, it seemed rather fixed to the anterior abdominal wall.  It was within the prior Pfannenstiel incision.  I wondered if she  had a endometrioma or some other abnormality, as it was rather firm and fixed to be a classic lipoma.  Another possibility would be a Spigelian type hernia or ventral wall hernia.  Differential diagnosis was discussed.  Options discussed. Recommendations made for diagnostic laparoscopy with possible reduction and repair of incarcerated ventral hernia versus abdominal exploration with excision of mass.  Possible need of abdominal wall reconstruction with mesh was discussed.  Risks of recurrence of mass was discussed. Risk of infection was discussed.  Other risks were discussed in detail. Questions answered and she agreed to proceed.  OPERATIVE FINDINGS:  She had a 5 x 4 x 4 cm fibrotic mass in the subcutaneous tissues going through the fascia and just breaching into the left rectus muscle.  On the right side, she had a similarly sized mostly subfascial mass involving the distal right rectus abdominis muscle adherent to the fascia as well, less involvement in the subcutaneous tissues.  Specimens marked as: short stitch superior, long stitch lateral, looped stitch is superficial.    There was no evidence of any intra-abdominal hernias.  DESCRIPTION OF PROCEDURE:  Informed consent was confirmed.  The patient underwent general anesthesia without any difficulty.  She was positioned supine with arms tucked.  She received IV antibiotics.  Her abdomen was prepped and draped in a sterile fashion.  Surgical time-out confirmed our plan.  I placed a #5-mm port just inferior to the umbilicus using a modified Hasson cutdown technique.  Entry was clean.  I induced carbon dioxide insufflation.  Camera inspection revealed no injury.  Diagnostic laparoscopy was done.  There were no adhesions to the anterior abdominal wall.  There were no evidence of any ventral hernias.  There was no evidence of any inguinal, femoral, or obturator hernias.  There was no evidence of any Spigelian or incisional  hernias.  The peritoneal lining appeared normal.  With no evidence of any hernia, I decided to go direct exploration.  I therefore made initially a 7-cm incision through her old Pfannenstiel incision.  I later had to extend that to around 15 cm little more medially and little more laterally.  I came down and freed the skin off the mass.  I came around the subcutaneous tissues and found a hard fibrous mass with not well-defined tissue planes.  It was obviously adherent to the fascia.  I cut into the fascia and trimmed this on block with taking a wedge of the anterior rectus fascia as well.  There was some invasion into the rectus muscles, so I did take a little bit of that but that was probably only 5% of the left rectus thickness needing to be removed.  I oriented the masses noted.    I did feel some nodularity in the right suprapubic fascia region as well.  I could feel on the fascia on the  that fascia.  I had created some skin flaps to avoid any stitches being exposed at the skin.  With that the mesh had good lay.  I then used the stitches to get the edges of the fascia and tacked that down to the mesh as well.  I tried to reapproximate some of the rectus muscle down to the mesh and help reapproximate some of the left rectus muscle down to avoid too much getting in the way of contracture.  I did do copious irrigation with good hemostasis.  I had circumferential stitches all the way around.  I had good underlay repair.  The resulting closure had tightened the wound down to about 7 x 5 cm in size with good underlay mesh all around.  I did copious irrigation.  I placed the On-Q catheters in primarily the subfascial planes on both sides using palpation and feeling, and placing it just prior to completely tying all the fascial stitches down, so I could safely direct it.  I reapproximated the Pfannenstiel incision using 3-0 Vicryl deep dermal stitches and 4-0 Monocryl  running subcuticular stitches and Steri-Strips.  I secured the drain stitch using nylon.  The patient was extubated to the intensive care room in stable condition.  I initially could not talk to the family, but I did talk to the patient in the recovery room family again.     Ardeth Sportsman, MD     SCG/MEDQ  D:  08/28/2011  T:  08/28/2011  Job:  [id number]  cc:   Cain Saupe, MD Fax: [phone]

## 2011-08-29 MED ORDER — OXYCODONE HCL 5 MG PO TABS: 5.0000 mg | ORAL_TABLET | ORAL | Status: DC | PRN

## 2011-08-29 NOTE — Discharge Summary (Signed)
Physician Discharge Summary  Patient ID: Megan Coleman MRN: [id number] DOB/AGE: [birth year] 26 y.o.  Admit date: 08/28/2011 Discharge date: 08/29/2011  Admission Diagnoses:  Discharge Diagnoses:  Principal Problem:  *Abdominal wall masses of bilateral lower quadrants, probable endometriomas   Discharged Condition: good  Hospital Course: Pt underwent surgery.  She adv diet well.  She was walking in hallways.  She has soreness, but it is controlled with oral medications at the time of D/C.  Therefore, I felt it was safe to D/C home with close follow-up  Consults: None  Significant Diagnostic Studies:   Treatments: surgery: Resection of abd wall massers with abd wall reconstruction/VWH repair w mesh  Discharge Exam: Blood pressure 101/66, pulse 68, temperature 98.5 F (36.9 C), temperature source Oral, resp. rate 16, height 5\' 6"  (1.676 m), weight 136 lb 6 oz (61.859 kg), SpO2 100.00%.  General: Pt awake/alert/oriented x4 in no major acute distress Eyes: PERRL, normal EOM. Neuro: CN II-XII intact w/o focal sensory/motor deficits. Lymph: No head/neck/groin lymphadenopathy Psych:  No delerium/psychosis/paranoia HEENT: Normocephalic, Mucus membranes moist.  No thrush Neck: Supple, No tracheal deviation Chest: No pain w good excursion CV:  Pulses intact.  Regular rhythm Abdomen: soft, Nondistended.  No incarcerated hernias.  Appropriate TTP.  Drain serosanguinous Ext:  SCDs BLE.  No mjr edema.  No cyanosis Skin: No petechiae / purpurae   Disposition: Final discharge disposition not confirmed  Discharge Orders    Future Orders Please Complete By Expires   Increase activity slowly        Medication List  As of 08/29/2011  4:49 PM   STOP taking these medications         ibuprofen 600 MG tablet         TAKE these medications         Fish Oil 1000 MG Caps   Take 1 capsule by mouth daily.      oxyCODONE 5 MG immediate release tablet   Commonly known as: Oxy  IR/ROXICODONE   Take 1-2 tablets (5-10 mg total) by mouth every 4 (four) hours as needed for pain.      Vitamin D3 2000 UNITS Tabs   Take 1 tablet by mouth daily.           Follow-up Information    Follow up with Kataryna Mcquilkin C., MD in 10 days.   Contact information:   [employer] [facility] [address] [place] [address] [phone]          Signed: Ardeth Sportsman. 08/29/2011, 4:49 PM

## 2011-08-29 NOTE — Progress Notes (Signed)
Patient provided with discharge teaching, JP drain care and emptying, and prescriptions. Patient verbalized understanding. Patient discharged to home.

## 2011-08-29 NOTE — Discharge Instructions (Signed)
20 Megan Coleman  08/15/2011   Your procedure is scheduled on:  08/28/11  Report to [facility] at 6:30 AM.  Call this number if you have problems the morning of surgery: [phone]   Remember:   Do not eat food:After Midnight.      Take these medicines the morning of surgery with A SIP OF WATER: none   Do not wear jewelry, make-up or nail polish.  Do not wear lotions, powders, or perfumes.   Do not shave underarms or legs 48 hours prior to surgery (men may shave face)  Do not bring valuables to the hospital.  Contacts, dentures or bridgework may not be worn into surgery.  Leave suitcase in the car. After surgery it may be brought to your room.  For patients admitted to the hospital, checkout time is 11:00 AM the day of discharge.   Patients discharged the day of surgery will not be allowed to drive home.  Name and phone number of your driver:   Special Instructions: CHG Shower Use Special Wash: 1/2 bottle night before surgery and 1/2 bottle morning of surgery.   Please read over the following fact sheets that you were given: MRSA Information     Continue to use Mupurocin ointment for a completed 5 days as previously instructed    Bulb Drain Home Care A bulb drain is a small, plastic reservoir which creates a gentle suction. It is used to remove excess fluid from a surgical wound. The color and amount of fluid will vary. Immediately after surgery, the fluid is bright red. It may gradually change to a yellow color. When the amount decreases to about 1 or 2 tablespoons (15 to 30 cc) per 24 hours, your caregiver will usually remove it. DAILY CARE  Keep the bulb compressed at all times, except while emptying it. The compression creates suction.   Keep sites where the tubes enter the skin dry and covered with a light bandage (dressing).   Tape the tubes to your skin, 1 to 2 inches below the insertion sites, to keep from pulling on your stitches. Tubes are stitched  in place and will not slip out.   Pin the bulb to your shirt (not to your pants) with a safety pin.   For the first few days after surgery, there usually is more fluid in the bulb. Empty the bulb whenever it becomes half full because the bulb does not create enough suction if it is too full. Include this amount in your 24 hour totals.   When the amount of drainage decreases, empty the bulb at the same time every day. Write down the amounts and the 24 hour totals. Your caregiver will want to know them. This helps your caregiver know when the tubes can be removed.   Once you are home, it is no longer necessary to strip the tubes as may have been done in the hospital. If there is drainage around the tube sites, change dressings and keep the area dry. If you see a clot in the tube, leave it alone. However, if the tube does not appear to be draining, let your caregiver know.  TO EMPTY THE BULB  Open the stopper to release suction.   Holding the stopper out of the way, pour drainage into the measuring cup that was sent home with you.   Measure and write down the amount. If there are 2 bulbs, note the amount of drainage from bulb 1 or bulb  2 and keep the totals separate. Your caregiver will want to know which tube is draining more.   Compress the bulb by folding it in half.   Replace the stopper.   Check the tape that holds the tube to your skin, and pin the bulb to your shirt.  SEEK MEDICAL CARE IF:  The drainage develops a bad odor.   You have an oral temperature above 102 F (38.9 C).   The amount of drainage from your wound suddenly increases or decreases.   You accidentally pull out your drain.   You have any other questions or concerns.  MAKE SURE YOU:   Understand these instructions.   Will watch your condition.   Will get help right away if you are not doing well or get worse.  Document Released: 06/07/2000 Document Revised: 05/30/2011 Document Reviewed:  06/10/2005 [facility] Patient Information 2012 [place], [place].

## 2011-09-09 ENCOUNTER — Encounter (INDEPENDENT_AMBULATORY_CARE_PROVIDER_SITE_OTHER): Payer: Self-pay | Admitting: Surgery

## 2011-09-09 ENCOUNTER — Ambulatory Visit (INDEPENDENT_AMBULATORY_CARE_PROVIDER_SITE_OTHER): Payer: Managed Care, Other (non HMO) | Admitting: Surgery

## 2011-09-09 VITALS — BP 128/66 | HR 72 | Temp 97.6°F | Resp 12 | Ht 67.0 in | Wt 137.2 lb

## 2011-09-09 DIAGNOSIS — N809 Endometriosis, unspecified: Secondary | ICD-10-CM

## 2011-09-09 MED ORDER — OXYCODONE HCL 5 MG PO TABS: 5.0000 mg | ORAL_TABLET | ORAL | Status: AC | PRN

## 2011-09-09 NOTE — Progress Notes (Signed)
Subjective:     Patient ID: Megan Coleman, female   DOB: 05-14-1986, 26 y.o.   MRN: [id number]  HPI  Megan Coleman  04/03/1986 [id number]  Patient Care Team: Cammie Fulp as PCP - General (Family Medicine)  This patient is a 26 y.o.female who presents today for surgical evaluation.   Procedure: Excision of abdominal wall masses along the old suprapubic Pfannenstiel incision 08/28/2011  Pathology: Endometriomas. Margins negative  Patient comes in today feeling sore but okay. Random narcotics. Using binder. Drain has come down to 7-15 mL a day. More clear. Urinating fine. No nausea or vomiting. Regular bowel movements. Eating okay. She comes by herself  Patient Active Problem List  Diagnoses  . Endometriosis of abdominal wall    Past Medical History  Diagnosis Date  . Abscess     lower left abdomen/groin area  . Abdominal pain   . Generalized headaches   . Abdominal wall mass of left lower quadrant, possible endometrioma 07/31/2011  . Lymph nodes enlarged   . Sickle cell anemia     Past Surgical History  Procedure Date  . Myomectomy 07/03/2006    In [place]  . Appendectomy 04/02/2009    In [place]  . Ventral hernia repair 08/28/11    removal of endometriomas in lower abdominal wall with mesh reconstruction    History   Social History  . Marital Status: Single    Spouse Name: N/A    Number of Children: N/A  . Years of Education: N/A   Occupational History  . Not on file.   Social History Main Topics  . Smoking status: Never Smoker   . Smokeless tobacco: Never Used  . Alcohol Use: No  . Drug Use: No  . Sexually Active: Not on file   Other Topics Concern  . Not on file   Social History Narrative   Originally from [place]    History reviewed. No pertinent family history.  Current outpatient prescriptions:Cholecalciferol (VITAMIN D3) 2000 UNITS TABS, Take 1 tablet by mouth daily., Disp: , Rfl: ;  Omega-3 Fatty Acids (FISH OIL)  1000 MG CAPS, Take 1 capsule by mouth daily., Disp: , Rfl: ;  oxyCODONE (OXY IR/ROXICODONE) 5 MG immediate release tablet, Take 1-2 tablets (5-10 mg total) by mouth every 4 (four) hours as needed for pain., Disp: 50 tablet, Rfl: 0  No Known Allergies  BP 128/66  Pulse 72  Temp(Src) 97.6 F (36.4 C) (Temporal)  Resp 12  Ht 5\' 7"  (1.702 m)  Wt 137 lb 3.2 oz (62.234 kg)  BMI 21.49 kg/m2  LMP 08/07/2011     Review of Systems  Constitutional: Negative for fever, chills and diaphoresis.  HENT: Negative for ear pain, sore throat and trouble swallowing.   Eyes: Negative for photophobia and visual disturbance.  Respiratory: Negative for cough and choking.   Cardiovascular: Negative for chest pain and palpitations.  Gastrointestinal: Positive for abdominal pain. Negative for nausea, vomiting, diarrhea, constipation, abdominal distention, anal bleeding and rectal pain.  Genitourinary: Negative for dysuria, frequency and difficulty urinating.  Musculoskeletal: Negative for myalgias and gait problem.  Skin: Negative for color change, pallor and rash.  Neurological: Negative for dizziness, speech difficulty, weakness and numbness.  Hematological: Negative for adenopathy.  Psychiatric/Behavioral: Negative for confusion and agitation. The patient is not nervous/anxious.        Objective:   Physical Exam  Constitutional: She is oriented to person, place, and time. She appears well-developed and well-nourished. No distress.  HENT:  Head: Normocephalic.  Mouth/Throat: Oropharynx is clear and moist. No oropharyngeal exudate.  Eyes: Conjunctivae and EOM are normal. Pupils are equal, round, and reactive to light. No scleral icterus.  Neck: Normal range of motion. No tracheal deviation present.  Cardiovascular: Normal rate and intact distal pulses.   Pulmonary/Chest: Effort normal. No respiratory distress. She exhibits no tenderness.  Abdominal: Soft. She exhibits no distension. There is no  tenderness. Hernia confirmed negative in the right inguinal area and confirmed negative in the left inguinal area.       Incisions clean with normal healing ridges.  No hernias  Drain in place - I removed  Genitourinary: No vaginal discharge found.  Musculoskeletal: Normal range of motion. She exhibits no tenderness.  Lymphadenopathy:       Right: No inguinal adenopathy present.       Left: No inguinal adenopathy present.  Neurological: She is alert and oriented to person, place, and time. No cranial nerve deficit. She exhibits normal muscle tone. Coordination normal.  Skin: Skin is warm and dry. No rash noted. She is not diaphoretic.  Psychiatric: She has a normal mood and affect. Her behavior is normal.       Assessment:     Endometriomas of abdominal wall within rectus. Probable uterine deposits from prior surgery when she lived in [place]. Recovering.    Plan:     Increase activity as tolerated.  Do not push through pain.  Binder as needed.  Naproxen 2 b.i.d. I've revnewed oxycodone #50  Advanced on diet as tolerated. Bowel regimen to avoid problems.  Return to clinic 2-3 weeks. The patient expressed understanding and appreciation

## 2011-09-09 NOTE — Patient Instructions (Signed)

## 2011-09-18 ENCOUNTER — Encounter (HOSPITAL_COMMUNITY): Payer: Self-pay | Admitting: Surgery

## 2011-11-22 ENCOUNTER — Telehealth (INDEPENDENT_AMBULATORY_CARE_PROVIDER_SITE_OTHER): Payer: Self-pay | Admitting: General Surgery

## 2011-11-22 NOTE — Telephone Encounter (Signed)
Called pt to see what was going on with her having pain since her surgery was done 3 months ago. The pt has been having pain that comes and goes since last month. The pt said the pain goes across her abdomen and she feels like she has had a fever before but never took her temp. I advised the pt to go see her medical doctor first to be evaluated b/c she might need a complete workup before she see's [place]. I did advise after she see's her medical doctor if it's related to her sugery that we would be glad to see her. The pt understands.

## 2011-11-22 NOTE — Telephone Encounter (Signed)
Pt is calling for appt, secondary to "severe pain, like a knife" at the site of a drain (now removed) placed during surgery.  She had surgery in March 2013.  Can she be worked in?  Please call her.

## 2011-11-22 NOTE — Telephone Encounter (Signed)
Total pain from the mesh used to reconstruct her defect to remove the endometrioma.  I would advise that she due to nonsteroidals round-the-clock such as Aleve 500 twice a day, ice/heat 6 times a day. Do that intensely for the next 2-3 weeks to hopefully calm the problem area down. I'm comfortable with seeing her just to check things  If she's having nausea vomiting or drainage from incisions or other issues, we can fit her in today. I wanted to see her one more time anyway.

## 2012-11-09 ENCOUNTER — Ambulatory Visit (INDEPENDENT_AMBULATORY_CARE_PROVIDER_SITE_OTHER): Payer: Self-pay | Admitting: Surgery

## 2012-11-09 ENCOUNTER — Encounter (INDEPENDENT_AMBULATORY_CARE_PROVIDER_SITE_OTHER): Payer: Self-pay | Admitting: Surgery

## 2012-11-09 VITALS — BP 110/64 | HR 65 | Temp 97.2°F | Ht 67.0 in | Wt 144.4 lb

## 2012-11-09 DIAGNOSIS — N920 Excessive and frequent menstruation with regular cycle: Secondary | ICD-10-CM

## 2012-11-09 DIAGNOSIS — R1032 Left lower quadrant pain: Secondary | ICD-10-CM

## 2012-11-09 MED ORDER — NAPROXEN 500 MG PO TABS: 500.0000 mg | ORAL_TABLET | Freq: Two times a day (BID) | ORAL | Status: DC

## 2012-11-09 NOTE — Progress Notes (Signed)
Subjective:     Patient ID: Megan Coleman, female   DOB: May 02, 1986, 27 y.o.   MRN: [id number]  HPI  [name]  1986-04-15 [id number]  Patient Care Team: Cain Saupe as PCP - General (Family Medicine) Henrine Screws, MD as Attending Physician (Family Medicine)  This patient is a 27 y.o.female who presents today for surgical evaluation at the request of Dr. Abigail Miyamoto.   Reason for visit: Abdominal wall pain  Pleasant young female.  I removed endometriomas off her lower abdominal wall fascia.  The cord mesh reconstruction.  She had some soreness but recovered.  She works at the [employer].  Does not do much heavy lifting.  She noticed increasing soreness in her lower abdomen about three months ago.  Not worse with menses likely endometriomas.  She feels mobile.  Usually worse with activity.  She has noted she has had increased menstrual bleeding was heavier flow.  There has been discussion about seeing a gynecologist but that has not happened yet.  Appetite down.  Weight fluctuates but mostly stable.  Has a bowel movement about every two days.  No nausea or vomiting.  This is not worse with eating.  No sick contacts.  No personal nor family history of GI/colon cancer, inflammatory bowel disease, irritable bowel syndrome, allergy such as Celiac Sprue, dietary/dairy problems, colitis, ulcers nor gastritis.  No recent sick contacts/gastroenteritis.  No travel outside the country.  No changes in diet.    Patient Active Problem List   Diagnosis Date Noted  . Endometriosis of abdominal wall s/p excision [id number] 07/31/2011    Past Medical History  Diagnosis Date  . Abscess     lower left abdomen/groin area  . Abdominal pain   . Generalized headaches   . Abdominal wall mass of left lower quadrant, possible endometrioma 07/31/2011  . Lymph nodes enlarged   . Sickle cell anemia   . Endometriosis of abdominal wall s/p excision [id number] 07/31/2011    Past Surgical History    Procedure Laterality Date  . Myomectomy  07/03/2006    In [place]  . Appendectomy  04/02/2009    In [place]  . Ventral hernia repair  08/28/11    removal of endometriomas in lower abdominal wall with mesh reconstruction  . Laparoscopy  08/28/2011    Procedure: LAPAROSCOPY DIAGNOSTIC;  Surgeon: Ardeth Sportsman, MD;  Location: [facility];  Service: General;  Laterality: N/A;  Diagnostic Laparoscopy with Removal of Abdominal Wall Mass  Open Ventral Wall Hernia Repair with Mesh   . Mass excision  08/28/2011    Procedure: EXCISION MASS;  Surgeon: Ardeth Sportsman, MD;  Location: [facility];  Service: General;  Laterality: N/A;  Removal of Mass Abdominal Wall   . Ventral hernia repair  08/28/2011    Procedure: HERNIA REPAIR VENTRAL ADULT;  Surgeon: Ardeth Sportsman, MD;  Location: [facility];  Service: General;  Laterality: N/A;   Open Ventral Wall Hernia with Mesh     History   Social History  . Marital Status: Single    Spouse Name: N/A    Number of Children: N/A  . Years of Education: N/A   Occupational History  . Not on file.   Social History Main Topics  . Smoking status: Never Smoker   . Smokeless tobacco: Never Used  . Alcohol Use: No  . Drug Use: No  . Sexually Active: Not on file   Other Topics Concern  . Not on file  Social History Narrative   Originally from [place]    No family history on file.  No current outpatient prescriptions on file.   No current facility-administered medications for this visit.     No Known Allergies  BP 110/64  Pulse 65  Temp(Src) 97.2 F (36.2 C) (Temporal)  Ht 5\' 7"  (1.702 m)  Wt 144 lb 6.4 oz (65.499 kg)  BMI 22.61 kg/m2  SpO2 98%  No results found.   Review of Systems  Constitutional: Negative for fever, chills, diaphoresis, appetite change and fatigue.  HENT: Negative for ear pain, sore throat, trouble swallowing, neck pain and ear discharge.   Eyes: Negative for photophobia, discharge and visual disturbance.   Respiratory: Negative for cough, choking, chest tightness and shortness of breath.   Cardiovascular: Negative for chest pain and palpitations.  Gastrointestinal: Positive for abdominal pain and constipation. Negative for nausea, vomiting, diarrhea, anal bleeding and rectal pain.  Endocrine: Negative for cold intolerance and heat intolerance.  Genitourinary: Positive for menstrual problem. Negative for dysuria, urgency, frequency, difficulty urinating and pelvic pain.  Musculoskeletal: Negative for myalgias and gait problem.  Skin: Negative for color change, pallor and rash.  Allergic/Immunologic: Negative for environmental allergies, food allergies and immunocompromised state.  Neurological: Negative for dizziness, speech difficulty, weakness and numbness.  Hematological: Negative for adenopathy.  Psychiatric/Behavioral: Negative for confusion and agitation. The patient is not nervous/anxious.        Objective:   Physical Exam  Constitutional: She is oriented to person, place, and time. She appears well-developed and well-nourished. No distress.  HENT:  Head: Normocephalic.  Mouth/Throat: Oropharynx is clear and moist. No oropharyngeal exudate.  Eyes: Conjunctivae and EOM are normal. Pupils are equal, round, and reactive to light. No scleral icterus.  Neck: Normal range of motion. Neck supple. No tracheal deviation present.  Cardiovascular: Normal rate, regular rhythm and intact distal pulses.   Pulmonary/Chest: Effort normal and breath sounds normal. No stridor. No respiratory distress. She exhibits no tenderness.  Abdominal: Soft. She exhibits no distension and no mass. There is no tenderness. Hernia confirmed negative in the right inguinal area and confirmed negative in the left inguinal area.    Genitourinary: No vaginal discharge found.  Musculoskeletal: Normal range of motion. She exhibits no tenderness.       Right elbow: She exhibits normal range of motion.       Left elbow:  She exhibits normal range of motion.       Right wrist: She exhibits normal range of motion.       Left wrist: She exhibits normal range of motion.       Right hand: Normal strength noted.       Left hand: Normal strength noted.  Lymphadenopathy:       Head (right side): No posterior auricular adenopathy present.       Head (left side): No posterior auricular adenopathy present.    She has no cervical adenopathy.    She has no axillary adenopathy.       Right: No inguinal adenopathy present.       Left: No inguinal adenopathy present.  Neurological: She is alert and oriented to person, place, and time. No cranial nerve deficit. She exhibits normal muscle tone. Coordination normal.  Skin: Skin is warm and dry. No rash noted. She is not diaphoretic. No erythema.  Psychiatric: She has a normal mood and affect. Her behavior is normal. Judgment and thought content normal.  Assessment:     Abdominal pain seems musculoskeletal with focal point at subcutaneous lateral stitch.  No evidence of endometrioma or hernia.   Mild constipation.    Plan:     Recommended a trial of anti-inflammatories.  Naprosyn 500 mg by mouth twice a day.  Heat six times a day.  Do that for three weeks.  The pain should calm down.  If it does, followup when necessary.  If not or worsens, consider CT scan to rule out more subtle mass or hernia.  Consider adding Elavil or injections for nerve block if no other problem found.  I strongly recommend she see gynecology for her change in her menstrual habits.  She agreed.  Treat constipation with a fiber bowel regimen.

## 2012-11-09 NOTE — Patient Instructions (Addendum)
I suspect you have a muscle pull at one of the stitches from your repair.  It should calm down with the use of anti-inflammatories and heat:  Managing Pain  Pain after surgery or related to activity is often due to strain/injury to muscle, tendon, nerves and/or incisions.  This pain is usually short-term and will improve in a few months.   Many people find it helpful to do the following things TOGETHER to help speed the process of healing and to get back to regular activity more quickly:  1. Avoid heavy physical activity a.  no lifting greater than 20 pounds b. Do not "push through" the pain.  Listen to your body and avoid positions and maneuvers than reproduce the pain c. Walking is okay as tolerated, but go slowly and stop when getting sore.  d. Remember: If it hurts to do it, then don't do it! 2. Take Anti-inflammatory medication  a. Take with food/snack around the clock for 1-2 weeks i. This helps the muscle and nerve tissues become less irritable and calm down faster b. Choose ONE of the following over-the-counter medications: i. Naproxen 220mg  tabs (ex. Aleve) 2 pills twice a day  ii. Ibuprofen 200mg  tabs (ex. Advil, Motrin) 3-4 pills with every meal and just before bedtime  3. Use a Heating pad or Ice/Cold Pack a. 4-6 times a day b. May use warm bath/hottub  or showers 4. Try Gentle Massage and/or Stretching  a. at the area of pain many times a day b. stop if you feel pain - do not overdo it  Try these steps together to help you body heal faster and avoid making things get worse.  Doing just one of these things may not be enough.    If you are not getting better after 3 weeks or are noticing you are getting worse, contact our office for further advice; we may need to re-evaluate you & see what other things we can do to help.  Consider seeing a gynecologist for your change in your menstrual cycle:  Menorrhagia Dysfunctional uterine bleeding is different from a normal menstrual  period. When periods are heavy or there is more bleeding than is usual for you, it is called menorrhagia. It may be caused by hormonal imbalance, or physical, metabolic, or other problems. Examination is necessary in order that your caregiver may treat treatable causes. If this is a continuing problem, a D&C may be needed. That means that the cervix (the opening of the uterus or womb) is dilated (stretched larger) and the lining of the uterus is scraped out. The tissue scraped out is then examined under a microscope by a specialist (pathologist) to make sure there is nothing of concern that needs further or more extensive treatment. HOME CARE INSTRUCTIONS   If medications were prescribed, take exactly as directed. Do not change or switch medications without consulting your caregiver.  Long term heavy bleeding may result in iron deficiency. Your caregiver may have prescribed iron pills. They help replace the iron your body lost from heavy bleeding. Take exactly as directed. Iron may cause constipation. If this becomes a problem, increase the bran, fruits, and roughage in your diet.  Do not take aspirin or medicines that contain aspirin one week before or during your menstrual period. Aspirin may make the bleeding worse.  If you need to change your sanitary pad or tampon more than once every 2 hours, stay in bed and rest as much as possible until the bleeding stops.  Eat  well-balanced meals. Eat foods high in iron. Examples are leafy green vegetables, meat, liver, eggs, and whole grain breads and cereals. Do not try to lose weight until the abnormal bleeding has stopped and your blood iron level is back to normal. SEEK MEDICAL CARE IF:   You need to change your sanitary pad or tampon more than once an hour.  You develop nausea (feeling sick to your stomach) and vomiting, dizziness, or diarrhea while you are taking your medicine.  You have any problems that may be related to the medicine you are  taking. SEEK IMMEDIATE MEDICAL CARE IF:   You have a fever.  You develop chills.  You develop severe bleeding or start to pass blood clots.  You feel dizzy or faint. MAKE SURE YOU:   Understand these instructions.  Will watch your condition.  Will get help right away if you are not doing well or get worse. Document Released: 06/10/2005 Document Revised: 09/02/2011 Document Reviewed: 01/29/2008 [facility] Patient Information 2013 [place], [place].   GETTING TO GOOD BOWEL HEALTH. Irregular bowel habits such as constipation and diarrhea can lead to many problems over time.  Having one soft bowel movement a day is the most important way to prevent further problems.  The anorectal canal is designed to handle stretching and feces to safely manage our ability to get rid of solid waste (feces, poop, stool) out of our body.  BUT, hard constipated stools can act like ripping concrete bricks and diarrhea can be a burning fire to this very sensitive area of our body, causing inflamed hemorrhoids, anal fissures, increasing risk is perirectal abscesses, abdominal pain/bloating, an making irritable bowel worse.     The goal: ONE SOFT BOWEL MOVEMENT A DAY!  To have soft, regular bowel movements:    Drink at least 8 tall glasses of water a day.     Take plenty of fiber.  Fiber is the undigested part of plant food that passes into the colon, acting s "natures broom" to encourage bowel motility and movement.  Fiber can absorb and hold large amounts of water. This results in a larger, bulkier stool, which is soft and easier to pass. Work gradually over several weeks up to 6 servings a day of fiber (25g a day even more if needed) in the form of: o Vegetables -- Root (potatoes, carrots, turnips), leafy green (lettuce, salad greens, celery, spinach), or cooked high residue (cabbage, broccoli, etc) o Fruit -- Fresh (unpeeled skin & pulp), Dried (prunes, apricots, cherries, etc ),  or stewed ( applesauce)  o Whole  grain breads, pasta, etc (whole wheat)  o Bran cereals    Bulking Agents -- This type of water-retaining fiber generally is easily obtained each day by one of the following:  o Psyllium bran -- The psyllium plant is remarkable because its ground seeds can retain so much water. This product is available as Metamucil, Konsyl, Effersyllium, Per Diem Fiber, or the less expensive generic preparation in drug and health food stores. Although labeled a laxative, it really is not a laxative.  o Methylcellulose -- This is another fiber derived from wood which also retains water. It is available as Citrucel. o Polyethylene Glycol - and "artificial" fiber commonly called Miralax or Glycolax.  It is helpful for people with gassy or bloated feelings with regular fiber o Flax Seed - a less gassy fiber than psyllium   No reading or other relaxing activity while on the toilet. If bowel movements take longer than 5 minutes,  you are too constipated   AVOID CONSTIPATION.  High fiber and water intake usually takes care of this.  Sometimes a laxative is needed to stimulate more frequent bowel movements, but    Laxatives are not a good long-term solution as it can wear the colon out. o Osmotics (Milk of Magnesia, Fleets phosphosoda, Magnesium citrate, MiraLax, GoLytely) are safer than  o Stimulants (Senokot, Castor Oil, Dulcolax, Ex Lax)    o Do not take laxatives for more than 7days in a row.    IF SEVERELY CONSTIPATED, try a Bowel Retraining Program: o Do not use laxatives.  o Eat a diet high in roughage, such as bran cereals and leafy vegetables.  o Drink six (6) ounces of prune or apricot juice each morning.  o Eat two (2) large servings of stewed fruit each day.  o Take one (1) heaping tablespoon of a psyllium-based bulking agent twice a day. Use sugar-free sweetener when possible to avoid excessive calories.  o Eat a normal breakfast.  o Set aside 15 minutes after breakfast to sit on the toilet, but do not strain  to have a bowel movement.  o If you do not have a bowel movement by the third day, use an enema and repeat the above steps.    Controlling diarrhea o Switch to liquids and simpler foods for a few days to avoid stressing your intestines further. o Avoid dairy products (especially milk & ice cream) for a short time.  The intestines often can lose the ability to digest lactose when stressed. o Avoid foods that cause gassiness or bloating.  Typical foods include beans and other legumes, cabbage, broccoli, and dairy foods.  Every person has some sensitivity to other foods, so listen to our body and avoid those foods that trigger problems for you. o Adding fiber (Citrucel, Metamucil, psyllium, Miralax) gradually can help thicken stools by absorbing excess fluid and retrain the intestines to act more normally.  Slowly increase the dose over a few weeks.  Too much fiber too soon can backfire and cause cramping & bloating. o Probiotics (such as active yogurt, Align, etc) may help repopulate the intestines and colon with normal bacteria and calm down a sensitive digestive tract.  Most studies show it to be of mild help, though, and such products can be costly. o Medicines:   Bismuth subsalicylate (ex. Kayopectate, Pepto Bismol) every 30 minutes for up to 6 doses can help control diarrhea.  Avoid if pregnant.   Loperamide (Immodium) can slow down diarrhea.  Start with two tablets (4mg  total) first and then try one tablet every 6 hours.  Avoid if you are having fevers or severe pain.  If you are not better or start feeling worse, stop all medicines and call your doctor for advice o Call your doctor if you are getting worse or not better.  Sometimes further testing (cultures, endoscopy, X-ray studies, bloodwork, etc) may be needed to help diagnose and treat the cause of the diarrhea. o

## 2013-04-11 ENCOUNTER — Emergency Department (HOSPITAL_COMMUNITY)
Admission: EM | Admit: 2013-04-11 | Discharge: 2013-04-11 | Disposition: A | Discharge status: Home / Self Care | Payer: Medicaid Other | Attending: Emergency Medicine | Admitting: Emergency Medicine

## 2013-04-11 ENCOUNTER — Encounter (HOSPITAL_COMMUNITY): Payer: Self-pay | Admitting: Emergency Medicine

## 2013-04-11 DIAGNOSIS — O9989 Other specified diseases and conditions complicating pregnancy, childbirth and the puerperium: Secondary | ICD-10-CM | POA: Insufficient documentation

## 2013-04-11 DIAGNOSIS — R52 Pain, unspecified: Secondary | ICD-10-CM | POA: Insufficient documentation

## 2013-04-11 DIAGNOSIS — R109 Unspecified abdominal pain: Secondary | ICD-10-CM | POA: Insufficient documentation

## 2013-04-11 DIAGNOSIS — R51 Headache: Secondary | ICD-10-CM | POA: Insufficient documentation

## 2013-04-11 DIAGNOSIS — Z79899 Other long term (current) drug therapy: Secondary | ICD-10-CM | POA: Insufficient documentation

## 2013-04-11 DIAGNOSIS — Z8742 Personal history of other diseases of the female genital tract: Secondary | ICD-10-CM | POA: Insufficient documentation

## 2013-04-11 DIAGNOSIS — Z9889 Other specified postprocedural states: Secondary | ICD-10-CM | POA: Insufficient documentation

## 2013-04-11 DIAGNOSIS — Z872 Personal history of diseases of the skin and subcutaneous tissue: Secondary | ICD-10-CM | POA: Insufficient documentation

## 2013-04-11 DIAGNOSIS — Z862 Personal history of diseases of the blood and blood-forming organs and certain disorders involving the immune mechanism: Secondary | ICD-10-CM | POA: Insufficient documentation

## 2013-04-11 DIAGNOSIS — R509 Fever, unspecified: Secondary | ICD-10-CM | POA: Insufficient documentation

## 2013-04-11 LAB — COMPREHENSIVE METABOLIC PANEL
Albumin: 4.1 g/dL (ref 3.5–5.2)
Alkaline Phosphatase: 58 U/L (ref 39–117)
BUN: 8 mg/dL (ref 6–23)
Chloride: 101 mEq/L (ref 96–112)
Creatinine, Ser: 0.59 mg/dL (ref 0.50–1.10)
GFR calc Af Amer: >90 mL/min (ref >90)
Glucose, Bld: 71 mg/dL (ref 70–99)
Potassium: 3.8 mEq/L (ref 3.5–5.1)
Total Bilirubin: 0.2 mg/dL — ABNORMAL LOW (ref 0.3–1.2)
Total Protein: 7.7 g/dL (ref 6.0–8.3)

## 2013-04-11 LAB — CBC WITH DIFFERENTIAL/PLATELET
Basophils Relative: 0 % (ref 0–1)
Eosinophils Absolute: 0.1 10*3/uL (ref 0.0–0.7)
HCT: 33.7 % — ABNORMAL LOW (ref 36.0–46.0)
Hemoglobin: 11.5 g/dL — ABNORMAL LOW (ref 12.0–15.0)
Lymphs Abs: 2.2 10*3/uL (ref 0.7–4.0)
MCH: 27.5 pg (ref 26.0–34.0)
MCHC: 34.1 g/dL (ref 30.0–36.0)
Monocytes Absolute: 0.6 10*3/uL (ref 0.1–1.0)
Monocytes Relative: 10 % (ref 3–12)
Neutrophils Relative %: 52 % (ref 43–77)
RBC: 4.18 MIL/uL (ref 3.87–5.11)

## 2013-04-11 LAB — URINALYSIS, ROUTINE W REFLEX MICROSCOPIC
Ketones, ur: NEGATIVE mg/dL
Leukocytes, UA: NEGATIVE
Nitrite: NEGATIVE
Protein, ur: NEGATIVE mg/dL

## 2013-04-11 LAB — POCT PREGNANCY, URINE: Preg Test, Ur: POSITIVE — AB

## 2013-04-11 MED ORDER — ACETAMINOPHEN 325 MG PO TABS
650.0000 mg | ORAL_TABLET | Freq: Once | ORAL | Status: AC
  Administered 2013-04-11: 650 mg via ORAL
  Filled 2013-04-11: qty 2

## 2013-04-11 NOTE — ED Provider Notes (Signed)
CSN: [id number]     Arrival date & time 04/11/13  1936 History   First MD Initiated Contact with Patient 04/11/13 2002     Chief Complaint  Patient presents with  . Fever  . Abdominal Pain    HPI Patient reports she is 3 months pregnant had a first trimester ultrasound confirming this pregnancy.  She is waiting for followup with OB/GYN.  She reports low-grade fever over the past several days.  She reports generalized malaise aches and headaches.  She also reports some mild fullness in her lower abdomen.  She denies focal lower abdominal discomfort.  She denies rash.  No diarrhea.  No nausea or vomiting.  No dysuria or urinary frequency.  Her symptoms are mild in severity.   Past Medical History  Diagnosis Date  . Abscess     lower left abdomen/groin area  . Abdominal pain   . Generalized headaches   . Abdominal wall mass of left lower quadrant, possible endometrioma 07/31/2011  . Lymph nodes enlarged   . Sickle cell anemia   . Endometriosis of abdominal wall s/p excision [id number] 07/31/2011   Past Surgical History  Procedure Laterality Date  . Myomectomy  07/03/2006    In [place]  . Appendectomy  04/02/2009    In [place]  . Ventral hernia repair  08/28/11    removal of endometriomas in lower abdominal wall with mesh reconstruction  . Laparoscopy  08/28/2011    Procedure: LAPAROSCOPY DIAGNOSTIC;  Surgeon: Ardeth Sportsman, MD;  Location: [facility];  Service: General;  Laterality: N/A;  Diagnostic Laparoscopy with Removal of Abdominal Wall Mass  Open Ventral Wall Hernia Repair with Mesh   . Mass excision  08/28/2011    Procedure: EXCISION MASS;  Surgeon: Ardeth Sportsman, MD;  Location: [facility];  Service: General;  Laterality: N/A;  Removal of Mass Abdominal Wall   . Ventral hernia repair  08/28/2011    Procedure: HERNIA REPAIR VENTRAL ADULT;  Surgeon: Ardeth Sportsman, MD;  Location: [facility];  Service: General;  Laterality: N/A;   Open Ventral Wall Hernia with Mesh    No family history on  file. History  Substance Use Topics  . Smoking status: Never Smoker   . Smokeless tobacco: Never Used  . Alcohol Use: No   OB History   Grav Para Term Preterm Abortions TAB SAB Ect Mult Living   1              Review of Systems  All other systems reviewed and are negative.    Allergies  Review of patient's allergies indicates no known allergies.  Home Medications   Current Outpatient Rx  Name  Route  Sig  Dispense  Refill  . Prenatal Vit-Fe Fumarate-FA (MULTIVITAMIN-PRENATAL) 27-0.8 MG TABS tablet   Oral   Take 1 tablet by mouth daily at 12 noon.          BP 110/64  Pulse 72  Temp(Src) 99.2 F (37.3 C) (Oral)  Resp 15  Ht 5\' 6"  (1.676 m)  Wt 150 lb (68.04 kg)  BMI 24.22 kg/m2  SpO2 100%  LMP 01/15/2013 Physical Exam  Nursing note and vitals reviewed. Constitutional: She is oriented to person, place, and time. She appears well-developed and well-nourished. No distress.  HENT:  Head: Normocephalic and atraumatic.  Eyes: EOM are normal.  Neck: Normal range of motion.  Cardiovascular: Normal rate, regular rhythm and normal heart sounds.   Pulmonary/Chest: Effort normal and breath sounds normal.  Abdominal: Soft. She exhibits no distension. There is no tenderness. There is no rebound and no guarding.  Musculoskeletal: Normal range of motion.  Neurological: She is alert and oriented to person, place, and time.  Skin: Skin is warm and dry.  Psychiatric: She has a normal mood and affect. Judgment normal.    ED Course  Procedures (including critical care time) Labs Review Labs Reviewed  CBC WITH DIFFERENTIAL - Abnormal; Notable for the following:    Hemoglobin 11.5 (*)    HCT 33.7 (*)    All other components within normal limits  COMPREHENSIVE METABOLIC PANEL - Abnormal; Notable for the following:    Sodium 133 (*)    Total Bilirubin 0.2 (*)    All other components within normal limits  URINALYSIS, ROUTINE W REFLEX MICROSCOPIC - Abnormal; Notable for the  following:    APPearance CLOUDY (*)    All other components within normal limits  POCT PREGNANCY, URINE - Abnormal; Notable for the following:    Preg Test, Ur POSITIVE (*)    All other components within normal limits   Imaging Review No results found.  EKG Interpretation   None       MDM   1. Fever    Labs without significant abnormality.  White blood cell count is normal.  I do not think the patient needs to be advanced imaging of her abdomen.  Patient had an appendectomy in 2010.  I have asked that the patient return the emergency department 48 hours for recheck of her abdomen.  She understands return to the ER sooner for new or worsening symptoms.  I suspect that her fever is more of a viral in origin and unrelated to any lower abdominal discomfort that she's having.    Lyanne Co, MD 04/11/13 2256

## 2013-04-11 NOTE — ED Notes (Signed)
Pt states she developed fever 4 days ago along with abd pain, pt is 3 months pregnant

## 2013-04-25 ENCOUNTER — Emergency Department (HOSPITAL_COMMUNITY)
Admission: EM | Admit: 2013-04-25 | Discharge: 2013-04-25 | Discharge status: Absent without leave | Payer: Self-pay | Source: Home / Self Care

## 2013-04-28 ENCOUNTER — Encounter (HOSPITAL_COMMUNITY): Payer: Self-pay | Admitting: *Deleted

## 2013-04-28 ENCOUNTER — Emergency Department (HOSPITAL_COMMUNITY)
Admission: EM | Admit: 2013-04-28 | Discharge: 2013-04-28 | Disposition: A | Discharge status: Home / Self Care | Payer: Medicaid Other | Source: Home / Self Care

## 2013-04-28 ENCOUNTER — Inpatient Hospital Stay (HOSPITAL_COMMUNITY)
Admission: AD | Admit: 2013-04-28 | Discharge: 2013-04-28 | Disposition: A | Discharge status: Home / Self Care | Payer: Medicaid Other | Source: Ambulatory Visit | Attending: Obstetrics & Gynecology | Admitting: Obstetrics & Gynecology

## 2013-04-28 DIAGNOSIS — B3731 Acute candidiasis of vulva and vagina: Secondary | ICD-10-CM | POA: Insufficient documentation

## 2013-04-28 DIAGNOSIS — O2390 Unspecified genitourinary tract infection in pregnancy, unspecified trimester: Secondary | ICD-10-CM | POA: Insufficient documentation

## 2013-04-28 DIAGNOSIS — B373 Candidiasis of vulva and vagina: Secondary | ICD-10-CM

## 2013-04-28 DIAGNOSIS — O26859 Spotting complicating pregnancy, unspecified trimester: Secondary | ICD-10-CM | POA: Insufficient documentation

## 2013-04-28 DIAGNOSIS — R109 Unspecified abdominal pain: Secondary | ICD-10-CM | POA: Insufficient documentation

## 2013-04-28 HISTORY — DX: Sickle-cell trait: D57.3

## 2013-04-28 LAB — URINALYSIS, ROUTINE W REFLEX MICROSCOPIC
Bilirubin Urine: NEGATIVE
Hgb urine dipstick: NEGATIVE
Ketones, ur: NEGATIVE mg/dL
Nitrite: NEGATIVE
Protein, ur: NEGATIVE mg/dL
Urobilinogen, UA: 0.2 mg/dL (ref 0.0–1.0)

## 2013-04-28 LAB — CBC
MCH: 27.1 pg (ref 26.0–34.0)
MCHC: 34 g/dL (ref 30.0–36.0)
MCV: 79.7 fL (ref 78.0–100.0)
Platelets: 163 10*3/uL (ref 150–400)
RDW: 14.6 % (ref 11.5–15.5)

## 2013-04-28 LAB — WET PREP, GENITAL: Clue Cells Wet Prep HPF POC: NONE SEEN

## 2013-04-28 MED ORDER — FLUCONAZOLE 150 MG PO TABS: ORAL_TABLET | ORAL | Status: DC

## 2013-04-28 MED ORDER — FLUCONAZOLE 150 MG PO TABS
150.0000 mg | ORAL_TABLET | ORAL | Status: AC
  Administered 2013-04-28: 150 mg via ORAL
  Filled 2013-04-28: qty 1

## 2013-04-28 NOTE — MAU Provider Note (Signed)
Attestation of Attending Supervision of Advanced Practitioner (PA/CNM/NP): Evaluation and management procedures were performed by the Advanced Practitioner under my supervision and collaboration.  I have reviewed the Advanced Practitioner's note and chart, and I agree with the management and plan.  Lucciana Head, MD, FACOG Attending Obstetrician & Gynecologist Faculty Practice, [facility]  

## 2013-04-28 NOTE — MAU Note (Signed)
abd pain, cramping in lower abd- started last week.  Having bleeding - spotting off and on since last wk, started after intercourse

## 2013-04-28 NOTE — MAU Provider Note (Signed)
Chief Complaint: Abdominal Pain and Vaginal Bleeding   First Provider Initiated Contact with Patient 04/28/13 1420     SUBJECTIVE HPI: Megan Coleman is a 27 y.o. G1P0 at [redacted]w[redacted]d by LMP who presents to maternity admissions reporting vaginal spotting x1 week intermittently.  Pt has not seen bleeding today but had some pink spotting when wiping yesterday.  On day bleeding started, pt had intercourse the night before and walked several miles on treadmill.  She has not yet started prenatal care and is waiting for her Medicaid.  She denies vaginal itching/burning, urinary symptoms, h/a, dizziness, n/v, or fever/chills.  Past Medical History  Diagnosis Date  . Abscess     lower left abdomen/groin area  . Abdominal pain   . Generalized headaches   . Abdominal wall mass of left lower quadrant, possible endometrioma 07/31/2011  . Lymph nodes enlarged   . Sickle cell anemia   . Endometriosis of abdominal wall s/p excision [id number] 07/31/2011  . Sickle cell trait    Past Surgical History  Procedure Laterality Date  . Myomectomy  07/03/2006    In [place]  . Appendectomy  04/02/2009    In [place]  . Ventral hernia repair  08/28/11    removal of endometriomas in lower abdominal wall with mesh reconstruction  . Laparoscopy  08/28/2011    Procedure: LAPAROSCOPY DIAGNOSTIC;  Surgeon: Ardeth Sportsman, MD;  Location: [facility];  Service: General;  Laterality: N/A;  Diagnostic Laparoscopy with Removal of Abdominal Wall Mass  Open Ventral Wall Hernia Repair with Mesh   . Mass excision  08/28/2011    Procedure: EXCISION MASS;  Surgeon: Ardeth Sportsman, MD;  Location: [facility];  Service: General;  Laterality: N/A;  Removal of Mass Abdominal Wall   . Ventral hernia repair  08/28/2011    Procedure: HERNIA REPAIR VENTRAL ADULT;  Surgeon: Ardeth Sportsman, MD;  Location: [facility];  Service: General;  Laterality: N/A;   Open Ventral Wall Hernia with Mesh    History   Social History  . Marital Status: Single   Spouse Name: N/A    Number of Children: N/A  . Years of Education: N/A   Occupational History  . Not on file.   Social History Main Topics  . Smoking status: Never Smoker   . Smokeless tobacco: Never Used  . Alcohol Use: No  . Drug Use: No  . Sexual Activity: Yes   Other Topics Concern  . Not on file   Social History Narrative   Originally from [place]   No current facility-administered medications on file prior to encounter.   No current outpatient prescriptions on file prior to encounter.   No Known Allergies  ROS: Pertinent items in HPI  OBJECTIVE Blood pressure 117/64, pulse 86, temperature 98.2 F (36.8 C), temperature source Oral, resp. rate 18, height 5' 5.5" (1.664 m), weight 70.761 kg (156 lb), last menstrual period 01/15/2013. GENERAL: Well-developed, well-nourished female in no acute distress.  HEENT: Normocephalic HEART: normal rate RESP: normal effort ABDOMEN: Soft, non-tender EXTREMITIES: Nontender, no edema NEURO: Alert and oriented Pelvic exam: Cervix pink, visually closed, without lesion, moderate white thick discharge, no bleeding noted, vaginal walls and external genitalia normal Bimanual exam: Cervix 0/long/high, firm, anterior  FHT 155  LAB RESULTS Results for orders placed during the hospital encounter of 04/28/13 (from the past 24 hour(s))  URINALYSIS, ROUTINE W REFLEX MICROSCOPIC     Status: Abnormal   Collection Time    04/28/13  1:40 PM      Result Value Range   Color, Urine STRAW (*) YELLOW   APPearance CLEAR  CLEAR   Specific Gravity, Urine 1.015  1.005 - 1.030   pH 7.0  5.0 - 8.0   Glucose, UA NEGATIVE  NEGATIVE mg/dL   Hgb urine dipstick NEGATIVE  NEGATIVE   Bilirubin Urine NEGATIVE  NEGATIVE   Ketones, ur NEGATIVE  NEGATIVE mg/dL   Protein, ur NEGATIVE  NEGATIVE mg/dL   Urobilinogen, UA 0.2  0.0 - 1.0 mg/dL   Nitrite NEGATIVE  NEGATIVE   Leukocytes, UA NEGATIVE  NEGATIVE  WET PREP, GENITAL     Status: Abnormal    Collection Time    04/28/13  2:25 PM      Result Value Range   Yeast Wet Prep HPF POC TOO NUMEROUS TO COUNT (*) NONE SEEN   Trich, Wet Prep NONE SEEN  NONE SEEN   Clue Cells Wet Prep HPF POC NONE SEEN  NONE SEEN   WBC, Wet Prep HPF POC FEW (*) NONE SEEN    ASSESSMENT 1. Vagina, candidiasis     PLAN Diflucan 150 mg x1 dose in MAU Discharge home Diflucan second dose prescribed to take in 3 days Pelvic rest x1-2 weeks after treatment Message sent to clinic to establish care Return to MAU as needed    Medication List         acetaminophen 325 MG tablet  Commonly known as:  TYLENOL  Take 325 mg by mouth 2 (two) times daily as needed for mild pain.     fluconazole 150 MG tablet  Commonly known as:  DIFLUCAN  Take one tablet 3 days from your dose at [facility].     prenatal multivitamin Tabs tablet  Take 1 tablet by mouth daily at 12 noon.           Follow-up Information   Follow up with [facility]. (The clinic will call you with an appointment or call the number listed below.  Return to MAU as needed.)    Specialty:  Obstetrics and Gynecology   Contact information:   [address] [place] [address] [phone]      Sharen Counter Certified Nurse-Midwife 04/28/2013  2:58 PM

## 2013-04-29 LAB — GC/CHLAMYDIA PROBE AMP: GC Probe RNA: NEGATIVE

## 2013-05-25 ENCOUNTER — Other Ambulatory Visit (HOSPITAL_COMMUNITY)
Admission: RE | Admit: 2013-05-25 | Discharge: 2013-05-25 | Disposition: A | Discharge status: Home / Self Care | Payer: Medicaid Other | Source: Ambulatory Visit | Attending: Family Medicine | Admitting: Family Medicine

## 2013-05-25 ENCOUNTER — Encounter: Payer: Self-pay | Admitting: Family Medicine

## 2013-05-25 ENCOUNTER — Ambulatory Visit (INDEPENDENT_AMBULATORY_CARE_PROVIDER_SITE_OTHER): Payer: Medicaid Other | Admitting: Family Medicine

## 2013-05-25 VITALS — BP 116/69 | Temp 97.2°F | Wt 160.7 lb

## 2013-05-25 DIAGNOSIS — O0932 Supervision of pregnancy with insufficient antenatal care, second trimester: Secondary | ICD-10-CM

## 2013-05-25 DIAGNOSIS — G44209 Tension-type headache, unspecified, not intractable: Secondary | ICD-10-CM

## 2013-05-25 DIAGNOSIS — F4541 Pain disorder exclusively related to psychological factors: Secondary | ICD-10-CM

## 2013-05-25 DIAGNOSIS — Z01419 Encounter for gynecological examination (general) (routine) without abnormal findings: Secondary | ICD-10-CM | POA: Insufficient documentation

## 2013-05-25 DIAGNOSIS — O0930 Supervision of pregnancy with insufficient antenatal care, unspecified trimester: Secondary | ICD-10-CM

## 2013-05-25 LAB — POCT URINALYSIS DIP (DEVICE)
Bilirubin Urine: NEGATIVE
Glucose, UA: NEGATIVE mg/dL
Specific Gravity, Urine: 1.02 (ref 1.005–1.030)
Urobilinogen, UA: 0.2 mg/dL (ref 0.0–1.0)

## 2013-05-25 MED ORDER — PRENATAL MULTIVITAMIN CH: 1.0000 | ORAL_TABLET | Freq: Every day | ORAL | Status: DC

## 2013-05-25 MED ORDER — BUTALBITAL-APAP-CAFFEINE 50-325-40 MG PO TABS: 1.0000 | ORAL_TABLET | Freq: Four times a day (QID) | ORAL | Status: DC | PRN

## 2013-05-25 NOTE — Patient Instructions (Signed)
Second Trimester of Pregnancy The second trimester is from week 13 through week 28, months 4 through 6. The second trimester is often a time when you feel your best. Your body has also adjusted to being pregnant, and you begin to feel better physically. Usually, morning sickness has lessened or quit completely, you may have more energy, and you may have an increase in appetite. The second trimester is also a time when the fetus is growing rapidly. At the end of the sixth month, the fetus is about 9 inches long and weighs about 1 pounds. You will likely begin to feel the baby move (quickening) between 18 and 20 weeks of the pregnancy. BODY CHANGES Your body goes through many changes during pregnancy. The changes vary from woman to woman.   Your weight will continue to increase. You will notice your lower abdomen bulging out.  You may begin to get stretch marks on your hips, abdomen, and breasts.  You may develop headaches that can be relieved by medicines approved by your caregiver.  You may urinate more often because the fetus is pressing on your bladder.  You may develop or continue to have heartburn as a result of your pregnancy.  You may develop constipation because certain hormones are causing the muscles that push waste through your intestines to slow down.  You may develop hemorrhoids or swollen, bulging veins (varicose veins).  You may have back pain because of the weight gain and pregnancy hormones relaxing your joints between the bones in your pelvis and as a result of a shift in weight and the muscles that support your balance.  Your breasts will continue to grow and be tender.  Your gums may bleed and may be sensitive to brushing and flossing.  Dark spots or blotches (chloasma, mask of pregnancy) may develop on your face. This will likely fade after the baby is born.  A dark line from your belly button to the pubic area (linea nigra) may appear. This will likely fade after the  baby is born. WHAT TO EXPECT AT YOUR PRENATAL VISITS During a routine prenatal visit:  You will be weighed to make sure you and the fetus are growing normally.  Your blood pressure will be taken.  Your abdomen will be measured to track your baby's growth.  The fetal heartbeat will be listened to.  Any test results from the previous visit will be discussed. Your caregiver may ask you:  How you are feeling.  If you are feeling the baby move.  If you have had any abnormal symptoms, such as leaking fluid, bleeding, severe headaches, or abdominal cramping.  If you have any questions. Other tests that may be performed during your second trimester include:  Blood tests that check for:  Low iron levels (anemia).  Gestational diabetes (between 24 and 28 weeks).  Rh antibodies.  Urine tests to check for infections, diabetes, or protein in the urine.  An ultrasound to confirm the proper growth and development of the baby.  An amniocentesis to check for possible genetic problems.  Fetal screens for spina bifida and Down syndrome. HOME CARE INSTRUCTIONS   Avoid all smoking, herbs, alcohol, and unprescribed drugs. These chemicals affect the formation and growth of the baby.  Follow your caregiver's instructions regarding medicine use. There are medicines that are either safe or unsafe to take during pregnancy.  Exercise only as directed by your caregiver. Experiencing uterine cramps is a good sign to stop exercising.  Continue to eat regular,   healthy meals.  Wear a good support bra for breast tenderness.  Do not use hot tubs, steam rooms, or saunas.  Wear your seat belt at all times when driving.  Avoid raw meat, uncooked cheese, cat litter boxes, and soil used by cats. These carry germs that can cause birth defects in the baby.  Take your prenatal vitamins.  Try taking a stool softener (if your caregiver approves) if you develop constipation. Eat more high-fiber foods,  such as fresh vegetables or fruit and whole grains. Drink plenty of fluids to keep your urine clear or pale yellow.  Take warm sitz baths to soothe any pain or discomfort caused by hemorrhoids. Use hemorrhoid cream if your caregiver approves.  If you develop varicose veins, wear support hose. Elevate your feet for 15 minutes, 3 4 times a day. Limit salt in your diet.  Avoid heavy lifting, wear low heel shoes, and practice good posture.  Rest with your legs elevated if you have leg cramps or low back pain.  Visit your dentist if you have not gone yet during your pregnancy. Use a soft toothbrush to brush your teeth and be gentle when you floss.  A sexual relationship may be continued unless your caregiver directs you otherwise.  Continue to go to all your prenatal visits as directed by your caregiver. SEEK MEDICAL CARE IF:   You have dizziness.  You have mild pelvic cramps, pelvic pressure, or nagging pain in the abdominal area.  You have persistent nausea, vomiting, or diarrhea.  You have a bad smelling vaginal discharge.  You have pain with urination. SEEK IMMEDIATE MEDICAL CARE IF:   You have a fever.  You are leaking fluid from your vagina.  You have spotting or bleeding from your vagina.  You have severe abdominal cramping or pain.  You have rapid weight gain or loss.  You have shortness of breath with chest pain.  You notice sudden or extreme swelling of your face, hands, ankles, feet, or legs.  You have not felt your baby move in over an hour.  You have severe headaches that do not go away with medicine.  You have vision changes. Document Released: 06/04/2001 Document Revised: 02/10/2013 Document Reviewed: 08/11/2012 Exit[employer] Patient Information 2014 [employer].  

## 2013-05-25 NOTE — Progress Notes (Signed)
Pulse- 98  Pain-headaches, cramping Weight gain of 25-35lbs New ob packet given

## 2013-05-25 NOTE — Progress Notes (Signed)
Subjective:    Megan Coleman is a G1P0 [redacted]w[redacted]d being seen today for her first obstetrical visit.  Her obstetrical history is significant for sickle cell trait. Patient does not intend to breast feed. Pregnancy history fully reviewed.  Patient reports headache, no bleeding, no contractions, no cramping and having pain in abdomen occasionally. not currently. Occassional nose bleeds.Ceasar Mons Vitals:   05/25/13 1430  BP: 116/69  Temp: 97.2 F (36.2 C)  Weight: 72.893 kg (160 lb 11.2 oz)    HISTORY: OB History  Gravida Para Term Preterm AB SAB TAB Ectopic Multiple Living  1         0    # Outcome Date GA Lbr Len/2nd Weight Sex Delivery Anes PTL Lv  1 CUR              Past Medical History  Diagnosis Date  . Abscess     lower left abdomen/groin area  . Abdominal pain   . Generalized headaches   . Abdominal wall mass of left lower quadrant, possible endometrioma 07/31/2011  . Sickle cell anemia   . Endometriosis of abdominal wall s/p excision [id number] 07/31/2011  . Sickle cell trait   . Lymph nodes enlarged    Past Surgical History  Procedure Laterality Date  . Myomectomy  07/03/2006    In [place]  . Appendectomy  04/02/2009    In [place]  . Ventral hernia repair  08/28/11    removal of endometriomas in lower abdominal wall with mesh reconstruction  . Laparoscopy  08/28/2011    Procedure: LAPAROSCOPY DIAGNOSTIC;  Surgeon: Ardeth Sportsman, MD;  Location: [facility];  Service: General;  Laterality: N/A;  Diagnostic Laparoscopy with Removal of Abdominal Wall Mass  Open Ventral Wall Hernia Repair with Mesh   . Mass excision  08/28/2011    Procedure: EXCISION MASS;  Surgeon: Ardeth Sportsman, MD;  Location: [facility];  Service: General;  Laterality: N/A;  Removal of Mass Abdominal Wall   . Ventral hernia repair  08/28/2011    Procedure: HERNIA REPAIR VENTRAL ADULT;  Surgeon: Ardeth Sportsman, MD;  Location: [facility];  Service: General;  Laterality: N/A;   Open Ventral Wall Hernia with  Mesh    History reviewed. No pertinent family history.   Exam    Uterus:     Pelvic Exam:    Perineum: No Hemorrhoids   Vulva: normal   Vagina:  normal mucosa   pH:    Cervix: no bleeding following Pap and no cervical motion tenderness   Adnexa: not evaluated   Bony Pelvis: average  System: Breast:  normal appearance, no masses or tenderness   Skin: normal coloration and turgor, no rashes    Neurologic: oriented, normal, negative, normal mood   Extremities: normal strength, tone, and muscle mass, no deformities   HEENT PERRLA   Mouth/Teeth mucous membranes moist, pharynx normal without lesions   Neck supple and no masses   Cardiovascular: regular rate and rhythm, no murmurs or gallops   Respiratory:  appears well, vitals normal, no respiratory distress, acyanotic, normal RR, ear and throat exam is normal, neck free of mass or lymphadenopathy, chest clear, no wheezing, crepitations, rhonchi, normal symmetric air entry   Abdomen: soft, non-tender; bowel sounds normal; no masses,  no organomegaly   Urinary: urethral meatus normal      Assessment:    Pregnancy: G1P0 Patient Active Problem List   Diagnosis Date Noted  . Abdominal pain, LLQ (left lower  quadrant), most likely at stitch from abd wall reconstruction 11/09/2012  . Menorrhagia 11/09/2012  . Endometriosis of abdominal wall s/p excision [id number] 07/31/2011        Plan:     Initial labs drawn. Prenatal vitamins. Problem list reviewed and updated. Genetic Screening discussed Quad Screen: requested.  Ultrasound discussed; fetal survey: ordered.  Follow up in 4 weeks. 50% of 20 min visit spent on counseling and coordination of care.    27 y.o. y/o G1P0 at [redacted]w[redacted]d weeks by LMP, here for New OB visit.   Discussed with Patient: - All new OB labs ordered. (cbc, abo/RH,  GC/C, RPR, Rubella, HBsAg, HIV, Urine Cx) - Physiologic changes of pregnancy/ Safe meds in pregnancy/Diet modifications,  [email or web address] - Routine precautions (SAB, depression, infection s/s).  Patient provided with all pertinent phone numbers for emergencies. - Review Safety measures: seat belt and proper seatbelt application - Dating [place] ordered; Patient to schedule. -  Quad screen/ CA screening counseling done. Pt consented for quad screen - RTC in 4 weeks for follow up. - Reviewed appropriate weight gain To Do: 1. Prenatal labs  [ ]  Vaccines: Flu: declines   Tdap:  [ ]  BCM:   Edu: [ x] PTL precautions; [ ]  BF class; [ ]  childbirth class; [ ]   BF counseling      Tejal Monroy RYAN 05/25/2013

## 2013-05-26 ENCOUNTER — Encounter: Payer: Self-pay | Admitting: Family Medicine

## 2013-05-26 DIAGNOSIS — O0990 Supervision of high risk pregnancy, unspecified, unspecified trimester: Secondary | ICD-10-CM | POA: Insufficient documentation

## 2013-05-26 LAB — PRESCRIPTION MONITORING PROFILE (19 PANEL)
Amphetamine/Meth: NEGATIVE ng/mL
Barbiturate Screen, Urine: NEGATIVE ng/mL
Benzodiazepine Screen, Urine: NEGATIVE ng/mL
Cannabinoid Scrn, Ur: NEGATIVE ng/mL
Carisoprodol, Urine: NEGATIVE ng/mL
Creatinine, Urine: 115.97 mg/dL (ref >20.0)
Methadone Screen, Urine: NEGATIVE ng/mL
Nitrites, Initial: NEGATIVE ug/mL
Opiate Screen, Urine: NEGATIVE ng/mL
Oxycodone Screen, Ur: NEGATIVE ng/mL
Phencyclidine, Ur: NEGATIVE ng/mL
Propoxyphene: NEGATIVE ng/mL
Tapentadol, urine: NEGATIVE ng/mL
Tramadol Scrn, Ur: NEGATIVE ng/mL
pH, Initial: 7.7 pH (ref 4.5–8.9)

## 2013-05-26 LAB — OBSTETRIC PANEL
Basophils Absolute: 0 10*3/uL (ref 0.0–0.1)
Basophils Relative: 0 % (ref 0–1)
Hemoglobin: 10.2 g/dL — ABNORMAL LOW (ref 12.0–15.0)
Hepatitis B Surface Ag: NEGATIVE
MCH: 26.7 pg (ref 26.0–34.0)
MCHC: 32.6 g/dL (ref 30.0–36.0)
Monocytes Absolute: 0.5 10*3/uL (ref 0.1–1.0)
Monocytes Relative: 11 % (ref 3–12)
Neutro Abs: 2.7 10*3/uL (ref 1.7–7.7)
Neutrophils Relative %: 60 % (ref 43–77)
Platelets: 163 10*3/uL (ref 150–400)
Rh Type: POSITIVE
WBC: 4.5 10*3/uL (ref 4.0–10.5)

## 2013-05-26 LAB — AFP, QUAD SCREEN
AFP: 72.9 IU/mL
Curr Gest Age: 18.4 wks.days
Down Syndrome Scr Risk Est: 1:12000 {titer}
HCG, Total: 18865 m[IU]/mL
MoM for AFP: 1.63
Open Spina bifida: NEGATIVE
uE3 Mom: 1.49
uE3 Value: 1.4 ng/mL

## 2013-05-26 LAB — ALCOHOL METABOLITE (ETG), URINE: Ethyl Glucuronide (EtG): NEGATIVE ng/mL

## 2013-05-26 LAB — HIV ANTIBODY (ROUTINE TESTING W REFLEX): HIV: NONREACTIVE

## 2013-05-27 ENCOUNTER — Ambulatory Visit (HOSPITAL_COMMUNITY): Payer: Medicaid Other

## 2013-05-27 LAB — CULTURE, OB URINE: Colony Count: 5000

## 2013-06-02 ENCOUNTER — Ambulatory Visit (HOSPITAL_COMMUNITY)
Admission: RE | Admit: 2013-06-02 | Discharge: 2013-06-02 | Disposition: A | Discharge status: Home / Self Care | Payer: Medicaid Other | Source: Ambulatory Visit | Attending: Family Medicine | Admitting: Family Medicine

## 2013-06-02 DIAGNOSIS — O3410 Maternal care for benign tumor of corpus uteri, unspecified trimester: Secondary | ICD-10-CM | POA: Insufficient documentation

## 2013-06-02 DIAGNOSIS — O0932 Supervision of pregnancy with insufficient antenatal care, second trimester: Secondary | ICD-10-CM

## 2013-06-02 DIAGNOSIS — Z3689 Encounter for other specified antenatal screening: Secondary | ICD-10-CM | POA: Insufficient documentation

## 2013-06-02 DIAGNOSIS — F4541 Pain disorder exclusively related to psychological factors: Secondary | ICD-10-CM

## 2013-06-06 ENCOUNTER — Encounter: Payer: Self-pay | Admitting: Family Medicine

## 2013-06-07 ENCOUNTER — Other Ambulatory Visit: Payer: Self-pay | Admitting: Family Medicine

## 2013-06-11 ENCOUNTER — Encounter (HOSPITAL_COMMUNITY): Payer: Self-pay | Admitting: *Deleted

## 2013-06-11 ENCOUNTER — Inpatient Hospital Stay (HOSPITAL_COMMUNITY)
Admission: AD | Admit: 2013-06-11 | Discharge: 2013-06-11 | Disposition: A | Discharge status: Home / Self Care | Payer: Medicaid Other | Source: Ambulatory Visit | Attending: Obstetrics and Gynecology | Admitting: Obstetrics and Gynecology

## 2013-06-11 ENCOUNTER — Inpatient Hospital Stay (HOSPITAL_COMMUNITY): Payer: Medicaid Other

## 2013-06-11 DIAGNOSIS — O26899 Other specified pregnancy related conditions, unspecified trimester: Secondary | ICD-10-CM

## 2013-06-11 DIAGNOSIS — R109 Unspecified abdominal pain: Secondary | ICD-10-CM | POA: Insufficient documentation

## 2013-06-11 DIAGNOSIS — O99891 Other specified diseases and conditions complicating pregnancy: Secondary | ICD-10-CM | POA: Insufficient documentation

## 2013-06-11 LAB — URINALYSIS, ROUTINE W REFLEX MICROSCOPIC
Bilirubin Urine: NEGATIVE
Glucose, UA: NEGATIVE mg/dL
Hgb urine dipstick: NEGATIVE
Ketones, ur: NEGATIVE mg/dL
Nitrite: NEGATIVE
Specific Gravity, Urine: >1.03 — ABNORMAL HIGH (ref 1.005–1.030)
pH: 6 (ref 5.0–8.0)

## 2013-06-11 LAB — CBC
Hemoglobin: 9.6 g/dL — ABNORMAL LOW (ref 12.0–15.0)
MCH: 27.8 pg (ref 26.0–34.0)
MCV: 82.9 fL (ref 78.0–100.0)
RBC: 3.45 MIL/uL — ABNORMAL LOW (ref 3.87–5.11)
RDW: 14.9 % (ref 11.5–15.5)

## 2013-06-11 MED ORDER — CYCLOBENZAPRINE HCL 10 MG PO TABS: 10.0000 mg | ORAL_TABLET | Freq: Two times a day (BID) | ORAL | Status: DC | PRN

## 2013-06-11 NOTE — MAU Provider Note (Signed)
History     CSN: [id number]  Arrival date and time: 06/11/13 1845   First Provider Initiated Contact with Patient 06/11/13 2006      Chief Complaint  Patient presents with  . Abdominal Pain  . Fever   HPI Megan Coleman is a 27 y.o. G1P0 at [redacted]w[redacted]d presents with complaints of 3 days of Right sided abdominal pain that started as moderate and slowly increased. Pt reports that it is now approximately 8/10 with occasional sharp stabbing pain. Pt reports she has not taken anything to make it better. And it has slowly worsened. Pt reports feeling hot tonight but did not take her temperature at home. Pt reports no sick contacts, no constipation or diarrhea, no urinary symptoms. Denies naseua and vomiting. Pt reports not associated with diet. No obivous trigger.  Of note pt has had an appendectomy.  OB History   Grav Para Term Preterm Abortions TAB SAB Ect Mult Living   1         0      Past Medical History  Diagnosis Date  . Abscess     lower left abdomen/groin area  . Abdominal pain   . Generalized headaches   . Abdominal wall mass of left lower quadrant, possible endometrioma 07/31/2011  . Sickle cell anemia   . Endometriosis of abdominal wall s/p excision [id number] 07/31/2011  . Sickle cell trait   . Lymph nodes enlarged     Past Surgical History  Procedure Laterality Date  . Myomectomy  07/03/2006    In [place]  . Appendectomy  04/02/2009    In [place]  . Ventral hernia repair  08/28/11    removal of endometriomas in lower abdominal wall with mesh reconstruction  . Laparoscopy  08/28/2011    Procedure: LAPAROSCOPY DIAGNOSTIC;  Surgeon: Ardeth Sportsman, MD;  Location: [facility];  Service: General;  Laterality: N/A;  Diagnostic Laparoscopy with Removal of Abdominal Wall Mass  Open Ventral Wall Hernia Repair with Mesh   . Mass excision  08/28/2011    Procedure: EXCISION MASS;  Surgeon: Ardeth Sportsman, MD;  Location: [facility];  Service: General;  Laterality: N/A;  Removal of  Mass Abdominal Wall   . Ventral hernia repair  08/28/2011    Procedure: HERNIA REPAIR VENTRAL ADULT;  Surgeon: Ardeth Sportsman, MD;  Location: [facility];  Service: General;  Laterality: N/A;   Open Ventral Wall Hernia with Mesh     History reviewed. No pertinent family history.  History  Substance Use Topics  . Smoking status: Never Smoker   . Smokeless tobacco: Never Used  . Alcohol Use: No    Allergies: No Known Allergies  Prescriptions prior to admission  Medication Sig Dispense Refill  . acetaminophen (TYLENOL) 325 MG tablet Take 325 mg by mouth 2 (two) times daily as needed for mild pain.      . butalbital-acetaminophen-caffeine (FIORICET) 50-325-40 MG per tablet Take 1-2 tablets by mouth every 6 (six) hours as needed for headache.  20 tablet  0  . fluconazole (DIFLUCAN) 150 MG tablet Take one tablet 3 days from your dose at [facility].  1 tablet  0  . Prenatal Vit-Fe Fumarate-FA (PRENATAL MULTIVITAMIN) TABS tablet Take 1 tablet by mouth daily at 12 noon.  90 tablet  3    ROS as above Physical Exam   Blood pressure 123/61, pulse 92, temperature 99.2 F (37.3 C), resp. rate 20, height 5\' 6"  (1.676 m), weight 73.936 kg (163 lb),  last menstrual period 01/15/2013.  Physical Exam VSS, NAD RRR no mgt CTAB no wrc Gravid No uterine tenderness. Multiple surgical scars over abdomen. Pain is over RLQ appendectomy scar and another port scar. Pain with light and deep palpation and pain with percussion or vibration. No c/c/e  FHT: 140s  Results for Megan Coleman (MRN [id number]) as of 06/11/2013 21:52  Ref. Range 06/11/2013 19:15 06/11/2013 20:10  WBC Latest Range: 4.0-10.5 K/uL  6.0  RBC Latest Range: 3.87-5.11 MIL/uL  3.45 (L)  Hemoglobin Latest Range: 12.0-15.0 g/dL  9.6 (L)  HCT Latest Range: 36.0-46.0 %  28.6 (L)  MCV Latest Range: 78.0-100.0 fL  82.9  MCH Latest Range: 26.0-34.0 pg  27.8  MCHC Latest Range: 30.0-36.0 g/dL  40.9  RDW Latest Range: 11.5-15.5 %  14.9   Platelets Latest Range: 150-400 K/uL  142 (L)  Color, Urine Latest Range: YELLOW  YELLOW   APPearance Latest Range: CLEAR  CLEAR   Specific Gravity, Urine Latest Range: 1.005-1.030  >1.030 (H)   pH Latest Range: 5.0-8.0  6.0   Glucose Latest Range: NEGATIVE mg/dL NEGATIVE   Bilirubin Urine Latest Range: NEGATIVE  NEGATIVE   Ketones, ur Latest Range: NEGATIVE mg/dL NEGATIVE   Protein Latest Range: NEGATIVE mg/dL NEGATIVE   Urobilinogen, UA Latest Range: 0.0-1.0 mg/dL 0.2   Nitrite Latest Range: NEGATIVE  NEGATIVE   Leukocytes, UA Latest Range: NEGATIVE  NEGATIVE   Hgb urine dipstick Latest Range: NEGATIVE  NEGATIVE     MAU Course  Procedures  MDM Pt does not have appencitis given hx of removal. Will eval with [place] and CBC.  [place] with bilateral blood flow to ovary, no concerning finding for gall bladder pathology even though pt was not NPO.  Assessment and Plan  Megan Coleman is a 27 y.o. G1P0 at [redacted]w[redacted]d with sub acute abdominal pain. Pt with likely stretching of adhesions from prior surgeries. No evidence of appendicitis, no evidence of torsions, no evidence of gall bladder. No complaints of fetus and overall reassuring. Will discharge home with flexeril.  Tawana Scale 06/11/2013, 8:14 PM

## 2013-06-11 NOTE — MAU Note (Signed)
Having sharp pain R lower stomach for 3 days. Pain is constant. Fever tonight. Denies n/v/d

## 2013-06-12 NOTE — MAU Provider Note (Signed)
Attestation of Attending Supervision of Advanced Practitioner: Evaluation and management procedures were performed by the PA/NP/CNM/OB Fellow under my supervision/collaboration. Chart reviewed and agree with management and plan.  Laelle Bridgett V 06/12/2013 9:30 AM

## 2013-06-18 ENCOUNTER — Telehealth: Payer: Self-pay | Admitting: Family Medicine

## 2013-06-18 DIAGNOSIS — D509 Iron deficiency anemia, unspecified: Secondary | ICD-10-CM

## 2013-06-18 MED ORDER — FERROUS GLUCONATE 324 (38 FE) MG PO TABS: 324.0000 mg | ORAL_TABLET | Freq: Every day | ORAL | Status: DC

## 2013-06-18 NOTE — Telephone Encounter (Signed)
CBC    Component Value Date/Time   WBC 6.0 06/11/2013 2010   RBC 3.45* 06/11/2013 2010   HGB 9.6* 06/11/2013 2010   HCT 28.6* 06/11/2013 2010   PLT 142* 06/11/2013 2010   MCV 82.9 06/11/2013 2010   MCH 27.8 06/11/2013 2010   MCHC 33.6 06/11/2013 2010   RDW 14.9 06/11/2013 2010   LYMPHSABS 1.2 05/25/2013 1519   MONOABS 0.5 05/25/2013 1519   EOSABS 0.0 05/25/2013 1519   BASOSABS 0.0 05/25/2013 1519    Pt anemic, will start on iron supplementation. PNV already with some, so will give dose at opposite part of day for BID dosing. Tawana Scale, MD OB Fellow

## 2013-06-22 ENCOUNTER — Ambulatory Visit (INDEPENDENT_AMBULATORY_CARE_PROVIDER_SITE_OTHER): Payer: Medicaid Other | Admitting: Obstetrics and Gynecology

## 2013-06-22 ENCOUNTER — Encounter: Payer: Self-pay | Admitting: Obstetrics and Gynecology

## 2013-06-22 VITALS — BP 117/68 | Temp 97.3°F | Wt 159.9 lb

## 2013-06-22 DIAGNOSIS — D696 Thrombocytopenia, unspecified: Secondary | ICD-10-CM | POA: Insufficient documentation

## 2013-06-22 DIAGNOSIS — Z3492 Encounter for supervision of normal pregnancy, unspecified, second trimester: Secondary | ICD-10-CM

## 2013-06-22 DIAGNOSIS — D509 Iron deficiency anemia, unspecified: Secondary | ICD-10-CM | POA: Insufficient documentation

## 2013-06-22 DIAGNOSIS — D689 Coagulation defect, unspecified: Secondary | ICD-10-CM

## 2013-06-22 LAB — POCT URINALYSIS DIP (DEVICE)
Hgb urine dipstick: NEGATIVE
Leukocytes, UA: NEGATIVE
Nitrite: NEGATIVE
Protein, ur: NEGATIVE mg/dL
Specific Gravity, Urine: 1.02 (ref 1.005–1.030)
Urobilinogen, UA: 0.2 mg/dL (ref 0.0–1.0)

## 2013-06-22 MED ORDER — FERROUS GLUCONATE 324 (38 FE) MG PO TABS: 324.0000 mg | ORAL_TABLET | Freq: Every day | ORAL | Status: DC

## 2013-06-22 MED ORDER — FERROUS SULFATE 325 (65 FE) MG PO TABS: 325.0000 mg | ORAL_TABLET | Freq: Two times a day (BID) | ORAL | Status: DC

## 2013-06-22 NOTE — Telephone Encounter (Signed)
Pt. In office today. Stated she did pick up her iron and has begun taking it.

## 2013-06-22 NOTE — Progress Notes (Signed)
P-100 

## 2013-06-22 NOTE — Addendum Note (Signed)
Addended by: Louanna Raw on: 06/22/2013 03:44 PM   Modules accepted: Orders, Medications

## 2013-06-22 NOTE — Progress Notes (Signed)
Feels weak at times. No N/V, meal skipping. Drinks plenty of fluids. Hgb 9.6. Cor: RRR, no m, Lungs CTA bilat. Plan> iron 325 mg 1 po bid Borderline thrombocytopenia> F/U at 28 wks.

## 2013-06-22 NOTE — Patient Instructions (Signed)

## 2013-06-22 NOTE — Telephone Encounter (Addendum)
Called pt. To re-insure she is actually taking iron tabs. Pt. Stated she actually isnt but that if we prescribe it she will. Informed pt. Prescription has been sent to her pharmacy. It has been printed so I will re-order so that it is e-precribed. Pt. States she will pick it up and has no other questions.  Prescription would not print. Called in to patients [facility] on [address].

## 2013-07-14 ENCOUNTER — Inpatient Hospital Stay (HOSPITAL_COMMUNITY)
Admission: AD | Admit: 2013-07-14 | Discharge: 2013-07-21 | DRG: 781 | Disposition: A | Discharge status: Home / Self Care | Payer: Medicaid Other | Source: Ambulatory Visit | Attending: Obstetrics & Gynecology | Admitting: Obstetrics & Gynecology

## 2013-07-14 DIAGNOSIS — Z3490 Encounter for supervision of normal pregnancy, unspecified, unspecified trimester: Secondary | ICD-10-CM

## 2013-07-14 DIAGNOSIS — D573 Sickle-cell trait: Secondary | ICD-10-CM | POA: Diagnosis present

## 2013-07-14 DIAGNOSIS — O4690 Antepartum hemorrhage, unspecified, unspecified trimester: Principal | ICD-10-CM | POA: Diagnosis present

## 2013-07-14 DIAGNOSIS — D509 Iron deficiency anemia, unspecified: Secondary | ICD-10-CM

## 2013-07-14 DIAGNOSIS — Z3480 Encounter for supervision of other normal pregnancy, unspecified trimester: Secondary | ICD-10-CM

## 2013-07-14 DIAGNOSIS — D689 Coagulation defect, unspecified: Secondary | ICD-10-CM | POA: Diagnosis present

## 2013-07-14 DIAGNOSIS — D696 Thrombocytopenia, unspecified: Secondary | ICD-10-CM | POA: Diagnosis present

## 2013-07-14 DIAGNOSIS — R51 Headache: Secondary | ICD-10-CM | POA: Diagnosis present

## 2013-07-14 DIAGNOSIS — O368390 Maternal care for abnormalities of the fetal heart rate or rhythm, unspecified trimester, not applicable or unspecified: Secondary | ICD-10-CM | POA: Diagnosis not present

## 2013-07-14 DIAGNOSIS — O99119 Other diseases of the blood and blood-forming organs and certain disorders involving the immune mechanism complicating pregnancy, unspecified trimester: Secondary | ICD-10-CM

## 2013-07-14 DIAGNOSIS — R1032 Left lower quadrant pain: Secondary | ICD-10-CM | POA: Diagnosis present

## 2013-07-14 DIAGNOSIS — O99019 Anemia complicating pregnancy, unspecified trimester: Secondary | ICD-10-CM | POA: Diagnosis present

## 2013-07-15 ENCOUNTER — Encounter (HOSPITAL_COMMUNITY): Payer: Self-pay

## 2013-07-15 ENCOUNTER — Inpatient Hospital Stay (HOSPITAL_COMMUNITY): Payer: Medicaid Other

## 2013-07-15 DIAGNOSIS — O4690 Antepartum hemorrhage, unspecified, unspecified trimester: Secondary | ICD-10-CM | POA: Diagnosis present

## 2013-07-15 DIAGNOSIS — D696 Thrombocytopenia, unspecified: Secondary | ICD-10-CM

## 2013-07-15 DIAGNOSIS — O99019 Anemia complicating pregnancy, unspecified trimester: Secondary | ICD-10-CM

## 2013-07-15 DIAGNOSIS — D689 Coagulation defect, unspecified: Secondary | ICD-10-CM

## 2013-07-15 DIAGNOSIS — O99119 Other diseases of the blood and blood-forming organs and certain disorders involving the immune mechanism complicating pregnancy, unspecified trimester: Secondary | ICD-10-CM

## 2013-07-15 LAB — TYPE AND SCREEN
ABO/RH(D): B POS
ANTIBODY SCREEN: NEGATIVE

## 2013-07-15 LAB — CBC
HCT: 31.2 % — ABNORMAL LOW (ref 36.0–46.0)
Hemoglobin: 10.3 g/dL — ABNORMAL LOW (ref 12.0–15.0)
MCH: 27.4 pg (ref 26.0–34.0)
MCHC: 33 g/dL (ref 30.0–36.0)
MCV: 83 fL (ref 78.0–100.0)
Platelets: 126 10*3/uL — ABNORMAL LOW (ref 150–400)
RBC: 3.76 MIL/uL — AB (ref 3.87–5.11)
RDW: 14.4 % (ref 11.5–15.5)
WBC: 5.6 10*3/uL (ref 4.0–10.5)

## 2013-07-15 MED ORDER — SODIUM CHLORIDE 0.9 % IJ SOLN
3.0000 mL | Freq: Two times a day (BID) | INTRAMUSCULAR | Status: DC
  Administered 2013-07-15 – 2013-07-20 (×11): 3 mL via INTRAVENOUS

## 2013-07-15 MED ORDER — PRENATAL MULTIVITAMIN CH
1.0000 | ORAL_TABLET | Freq: Every day | ORAL | Status: DC
  Administered 2013-07-15 – 2013-07-21 (×7): 1 via ORAL
  Filled 2013-07-15 (×7): qty 1

## 2013-07-15 MED ORDER — DOCUSATE SODIUM 100 MG PO CAPS
100.0000 mg | ORAL_CAPSULE | Freq: Every day | ORAL | Status: DC
  Administered 2013-07-15 – 2013-07-21 (×7): 100 mg via ORAL
  Filled 2013-07-15 (×7): qty 1

## 2013-07-15 MED ORDER — BETAMETHASONE SOD PHOS & ACET 6 (3-3) MG/ML IJ SUSP
12.5000 mg | INTRAMUSCULAR | Status: AC
  Administered 2013-07-15 – 2013-07-16 (×2): 12.5 mg via INTRAMUSCULAR
  Filled 2013-07-15 (×2): qty 2.1

## 2013-07-15 MED ORDER — CALCIUM CARBONATE ANTACID 500 MG PO CHEW: 2.0000 | CHEWABLE_TABLET | ORAL | Status: DC | PRN

## 2013-07-15 MED ORDER — ACETAMINOPHEN 325 MG PO TABS
650.0000 mg | ORAL_TABLET | ORAL | Status: DC | PRN
  Administered 2013-07-16 – 2013-07-19 (×4): 650 mg via ORAL
  Filled 2013-07-15 (×4): qty 2

## 2013-07-15 MED ORDER — ZOLPIDEM TARTRATE 5 MG PO TABS: 5.0000 mg | ORAL_TABLET | Freq: Every evening | ORAL | Status: DC | PRN

## 2013-07-15 MED ORDER — FERROUS GLUCONATE 324 (38 FE) MG PO TABS
324.0000 mg | ORAL_TABLET | Freq: Every day | ORAL | Status: DC
  Administered 2013-07-15 – 2013-07-21 (×7): 324 mg via ORAL
  Filled 2013-07-15 (×7): qty 1

## 2013-07-15 NOTE — MAU Note (Signed)
Pt c/o vaginal bleeding and abdominal pain since 10pm. States some LOF, but does not know if her water has broken. States +FM.

## 2013-07-15 NOTE — H&P (Signed)
History    CSN: [id number]  Arrival date and time: 07/14/13 2323  None  Chief Complaint   Patient presents with   .  Vaginal Bleeding   .  Abdominal Pain    Vaginal Bleeding  Associated symptoms include abdominal pain and headaches. Pertinent negatives include no chills, diarrhea, dysuria, fever, nausea, rash or vomiting.  Abdominal Pain  Associated symptoms include headaches. Pertinent negatives include no diarrhea, dysuria, fever, nausea or vomiting.   Megan Coleman is a 28 y.o. G1P0 at [redacted]w[redacted]d who presented to MAU c/o vaginal bleeding and abdominal pain. Bleeding started at 10 p.m., bright red in color, and began as running stream (approx 1 cup) then became clots. Abdominal pain started 15 minutes after bleeding, located in lower pelvic area, described as sharp/stabbing and ranked 8/9.  Last intercourse was 2 days ago.  Describes previous episode of vaginal bleeding during this pregnancy - October. Has been talking Flexeril for abdominal pain since December but this feels different to her. Denies LOF. Positive fetal movement. PMH significant for SCT and thrombocytopenia. Seen in [facility] for [facility].    Past Medical History   Diagnosis  Date   .  Abscess      lower left abdomen/groin area   .  Abdominal pain    .  Generalized headaches    .  Abdominal wall mass of left lower quadrant, possible endometrioma  07/31/2011   .  Sickle cell anemia    .  Endometriosis of abdominal wall s/p excision [id number]  07/31/2011   .  Sickle cell trait    .  Lymph nodes enlarged     Past Surgical History   Procedure  Laterality  Date   .  Myomectomy   07/03/2006     In [place]   .  Appendectomy   04/02/2009     In [place]   .  Ventral hernia repair   08/28/11     removal of endometriomas in lower abdominal wall with mesh reconstruction   .  Laparoscopy   08/28/2011     Procedure: LAPAROSCOPY DIAGNOSTIC; Surgeon: Adin Hector, MD; Location: [facility]; Service: General; Laterality: N/A; Diagnostic  Laparoscopy with Removal of Abdominal Wall Mass Open Ventral Wall Hernia Repair with Mesh   .  Mass excision   08/28/2011     Procedure: EXCISION MASS; Surgeon: Adin Hector, MD; Location: [facility]; Service: General; Laterality: N/A; Removal of Mass Abdominal Wall   .  Ventral hernia repair   08/28/2011     Procedure: HERNIA REPAIR VENTRAL ADULT; Surgeon: Adin Hector, MD; Location: [facility]; Service: General; Laterality: N/A; Open Ventral Wall Hernia with Mesh    History reviewed. No pertinent family history.  History   Substance Use Topics   .  Smoking status:  Never Smoker   .  Smokeless tobacco:  Never Used   .  Alcohol Use:  No    Allergies: No Known Allergies  Prescriptions prior to admission   Medication  Sig  Dispense  Refill   .  butalbital-acetaminophen-caffeine (FIORICET) 50-325-40 MG per tablet  Take 1-2 tablets by mouth every 6 (six) hours as needed for headache.  20 tablet  0   .  cyclobenzaprine (FLEXERIL) 10 MG tablet  Take 1 tablet (10 mg total) by mouth 2 (two) times daily as needed for muscle spasms.  20 tablet  0   .  ferrous gluconate (FERGON) 324 MG tablet  Take 1 tablet (324 mg total)  by mouth daily with breakfast.  90 tablet  3   .  Prenatal Vit-Fe Fumarate-FA (PRENATAL MULTIVITAMIN) TABS tablet  Take 1 tablet by mouth daily at 12 noon.  90 tablet  3   .  ferrous sulfate (FERROUSUL) 325 (65 FE) MG tablet  Take 1 tablet (325 mg total) by mouth 2 (two) times daily.  60 tablet  1    Review of Systems  Constitutional: Negative for fever and chills.  Eyes: Negative for blurred vision.  Respiratory: Positive for shortness of breath. Negative for cough.  Cardiovascular: Negative for chest pain.  Gastrointestinal: Positive for abdominal pain. Negative for nausea, vomiting and diarrhea.  Genitourinary: Positive for vaginal bleeding. Negative for dysuria.  Skin: Negative for rash.  Neurological: Positive for headaches.   Physical Exam   Blood pressure 114/68, pulse 81,  temperature 98.1 F (36.7 C), temperature source Oral, resp. rate 18, last menstrual period 01/15/2013, SpO2 100.00%.  Physical Exam  Constitutional: She is oriented to person, place, and time. She appears well-developed and well-nourished.  HENT:  Head: Normocephalic and atraumatic.  Cardiovascular: Normal rate, regular rhythm and normal heart sounds.  Respiratory: Effort normal. No respiratory distress.  GI: There is tenderness (lower pelvic pain).  gravid  Genitourinary:  Uterus enlarged.  No pooling of flood.  Small amount of bright red blood in vaginal vault. Musculoskeletal: She exhibits no edema.  Neurological: She is alert and oriented to person, place, and time.  Psychiatric: She has a normal mood and affect. Her behavior is normal.   FHR - 155 bpm baseline; moderate variability; accels present; rare variable decel present  Category I  Dilation: Closed  Effacement (%): Thick  Exam by:: Lynnda Child CNM   Prenatal Transfer Tool   Maternal Diabetes: not performed yet  Genetic Screening: Normal  Maternal Ultrasounds/Referrals: Normal  Fetal Ultrasounds or other Referrals: None  Maternal Substance Abuse: No  Significant Maternal Medications: None  Significant Maternal Lab Results: None   MAU Course   Procedures   MDM  - Fern test  Assessment and Plan   #27 y.o. female G28P0 [redacted]w[redacted]d  #Vaginal bleeding in 3rd trimester  #Abdominal pain  Admit to antepartum for monitoring and observation  US in the morning  Administer BMZ x 2  TUCKER, BRITTON L  07/15/2013, 12:46 AM   I have seen and examined this patient and I agree with the above. Serita Grammes 7:16 AM 07/15/2013

## 2013-07-15 NOTE — Plan of Care (Signed)
Problem: Consults Goal: Birthing Suites Patient Information Press F2 to bring up selections list Outcome: Completed/Met Date Met:  07/15/13  Pt < [redacted] weeks EGA

## 2013-07-15 NOTE — Progress Notes (Signed)
UR completed 

## 2013-07-15 NOTE — MAU Provider Note (Signed)
History     CSN: [id number]  Arrival date and time: 07/14/13 2323   None     Chief Complaint  Patient presents with  . Vaginal Bleeding  . Abdominal Pain   Vaginal Bleeding Associated symptoms include abdominal pain and headaches. Pertinent negatives include no chills, diarrhea, dysuria, fever, nausea, rash or vomiting.  Abdominal Pain Associated symptoms include headaches. Pertinent negatives include no diarrhea, dysuria, fever, nausea or vomiting.    Megan Coleman is a 28 y.o. G1P0 at [redacted]w[redacted]d who presented to MAU c/o vaginal bleeding and abdominal pain.  Bleeding started at 10 p.m., bright red in color, and began as running stream (approx 1 cup) then became clots.  Abdominal pain started 15 minutes after bleeding, located in lower pelvic area, described as sharp/stabbing and ranked 8/9.  Last intercourse was 2 days ago.  Describes previous episode of vaginal bleeding during this pregnancy - October.  Has been talking Flexeril for abdominal pain since December but this feels different to her.   Denies LOF.  Positive fetal movement.  PMH significant for SCT and thrombocytopenia.  Seen in [facility] for [facility].   Past Medical History  Diagnosis Date  . Abscess     lower left abdomen/groin area  . Abdominal pain   . Generalized headaches   . Abdominal wall mass of left lower quadrant, possible endometrioma 07/31/2011  . Sickle cell anemia   . Endometriosis of abdominal wall s/p excision [id number] 07/31/2011  . Sickle cell trait   . Lymph nodes enlarged     Past Surgical History  Procedure Laterality Date  . Myomectomy  07/03/2006    In [place]  . Appendectomy  04/02/2009    In [place]  . Ventral hernia repair  08/28/11    removal of endometriomas in lower abdominal wall with mesh reconstruction  . Laparoscopy  08/28/2011    Procedure: LAPAROSCOPY DIAGNOSTIC;  Surgeon: Adin Hector, MD;  [facility]: [facility];  Service: General;  Laterality: N/A;  Diagnostic Laparoscopy with Removal  of Abdominal Wall Mass  Open Ventral Wall Hernia Repair with Mesh   . Mass excision  08/28/2011    Procedure: EXCISION MASS;  Surgeon: Adin Hector, MD;  Location: [facility];  Service: General;  Laterality: N/A;  Removal of Mass Abdominal Wall   . Ventral hernia repair  08/28/2011    Procedure: HERNIA REPAIR VENTRAL ADULT;  Surgeon: Adin Hector, MD;  [facility]: [facility];  Service: General;  Laterality: N/A;   Open Ventral Wall Hernia with Mesh     History reviewed. No pertinent family history.  History  Substance Use Topics  . Smoking status: Never Smoker   . Smokeless tobacco: Never Used  . Alcohol Use: No    Allergies: No Known Allergies  Prescriptions prior to admission  Medication Sig Dispense Refill  . butalbital-acetaminophen-caffeine (FIORICET) 50-325-40 MG per tablet Take 1-2 tablets by mouth every 6 (six) hours as needed for headache.  20 tablet  0  . cyclobenzaprine (FLEXERIL) 10 MG tablet Take 1 tablet (10 mg total) by mouth 2 (two) times daily as needed for muscle spasms.  20 tablet  0  . ferrous gluconate (FERGON) 324 MG tablet Take 1 tablet (324 mg total) by mouth daily with breakfast.  90 tablet  3  . Prenatal Vit-Fe Fumarate-FA (PRENATAL MULTIVITAMIN) TABS tablet Take 1 tablet by mouth daily at 12 noon.  90 tablet  3  . ferrous sulfate (FERROUSUL) 325 (65 FE) MG tablet Take 1  tablet (325 mg total) by mouth 2 (two) times daily.  60 tablet  1    Review of Systems  Constitutional: Negative for fever and chills.  Eyes: Negative for blurred vision.  Respiratory: Positive for shortness of breath. Negative for cough.   Cardiovascular: Negative for chest pain.  Gastrointestinal: Positive for abdominal pain. Negative for nausea, vomiting and diarrhea.  Genitourinary: Positive for vaginal bleeding. Negative for dysuria.  Skin: Negative for rash.  Neurological: Positive for headaches.   Physical Exam   Blood pressure 114/68, pulse 81, temperature 98.1 F (36.7 C),  temperature source Oral, resp. rate 18, last menstrual period 01/15/2013, SpO2 100.00%.  Physical Exam  Constitutional: She is oriented to person, place, and time. She appears well-developed and well-nourished.  HENT:  Head: Normocephalic and atraumatic.  Cardiovascular: Normal rate, regular rhythm and normal heart sounds.   Respiratory: Effort normal. No respiratory distress.  GI: There is tenderness (lower pelvic pain).  gravid  Genitourinary:  Uterus enlarged.  No pooling of flood.  Small amount of bright red blood in vaginal vault.  Musculoskeletal: She exhibits no edema.  Neurological: She is alert and oriented to person, place, and time.  Psychiatric: She has a normal mood and affect. Her behavior is normal.   FHR - 155 bpm baseline; moderate variability; accels present; rare variable decel present Category I  Dilation: Closed Effacement (%): Thick Exam by:: Lynnda Child CNM   Prenatal Transfer Tool  Maternal Diabetes: not performed yet Genetic Screening: Normal Maternal Ultrasounds/Referrals: Normal Fetal Ultrasounds or other Referrals:  None Maternal Substance Abuse:  No Significant Maternal Medications:  None Significant Maternal Lab Results: None    MAU Course  Procedures  MDM - Fern test  Assessment and Plan  #27 y.o. female G30P0 [redacted]w[redacted]d  #Vaginal bleeding in 3rd trimester #Abdominal pain  Admit to antepartum for monitoring and observation US in the morning Administer BMZ x 2   TUCKER, BRITTON L 07/15/2013, 12:46 AM   I have seen and examined this patient and I agree with the above. Serita Grammes 7:15 AM 07/15/2013

## 2013-07-16 ENCOUNTER — Inpatient Hospital Stay (HOSPITAL_COMMUNITY): Payer: Medicaid Other

## 2013-07-16 DIAGNOSIS — O4590 Premature separation of placenta, unspecified, unspecified trimester: Secondary | ICD-10-CM

## 2013-07-16 LAB — GC/CHLAMYDIA PROBE AMP
CT PROBE, AMP APTIMA: NEGATIVE
GC Probe RNA: NEGATIVE

## 2013-07-16 NOTE — Progress Notes (Addendum)
Patient ID: Megan Coleman, female   DOB: 18-Aug-1985, 28 y.o.   MRN: [id number] Megan Coleman) NOTE  Megan Coleman is a 28 y.o. G1P0 at [redacted]w[redacted]d  who is admitted for vaginal bleeding and suspected abruption .   Length of Stay:  2  Days  Subjective: No complaints Patient reports the fetal movement as active. Patient reports uterine contraction  activity as none. Patient reports  vaginal bleeding as none. (stopped late yesterday) Patient describes fluid per vagina as None.  Vitals:  Blood pressure 102/55, pulse 91, temperature 98 F (36.7 C), temperature source Oral, resp. rate 18, height 5\' 6"  (1.676 m), weight 158 lb (71.668 kg), last menstrual period 01/15/2013, SpO2 100.00%. Physical Examination:  General appearance - alert, well appearing, and in no distress Abdomen - soft, nontender, fundus non tender Extremities - no edema, redness or tenderness in the calves or thighs  Fetal Monitoring:  Baseline: 150 bpm, Variability: Good {> 6 bpm), Accelerations: Reactive and Decelerations: one decel at 6:12 am with spontaneous resolution.  Labs:  No results found for this or any previous visit (from the past 24 hour(s)).  Imaging Studies:    None   Medications:  Scheduled . docusate sodium  100 mg Oral Daily  . ferrous gluconate  324 mg Oral Q breakfast  . prenatal multivitamin  1 tablet Oral Q1200  . sodium chloride  3 mL Intravenous Q12H   I have reviewed the patient's current medications.  ASSESSMENT: Patient Active Problem List   Diagnosis Date Noted  . Vaginal bleeding in pregnancy 07/15/2013  . Anemia, iron deficiency 06/22/2013  . Thrombocytopenia complicating pregnancy 29/93/7169  . Supervision of low-risk pregnancy 05/26/2013  . Abdominal pain, LLQ (left lower quadrant), most likely at stitch from abd wall reconstruction 11/09/2012  . Endometriosis of abdominal wall s/p excision [id number] 07/31/2011    PLAN: Continue to monitor for  further signs of abruption S/p betamethasone Need operative note about myomectomy from [place].  Patient said is was a large myoma that required a laparotomy Need to consult general surgery about addressing mesh at time of cesarean section (Pt had resection of abdominal wall endometrioma).  Megan H. 07/16/2013,6:41 AM

## 2013-07-17 ENCOUNTER — Encounter: Payer: Self-pay | Admitting: Obstetrics and Gynecology

## 2013-07-17 NOTE — Progress Notes (Signed)
Patient ID: Megan Coleman, female   DOB: 1985/10/11, 28 y.o.   MRN: [id number] [place]) NOTE  Megan Coleman is a 28 y.o. G1P0 at [redacted]w[redacted]d by date of conception, early ultrasound who is admitted for second trimester bleeding , now resolved, hosp day3.Marland Kitchen   Fetal presentation is unsure. Length of Stay:  3  Days  Subjective: Pt has mild lower abd discomfort , no bleeding or uterine activity. Pt plans to return to work after release, given note to rTW 1 Feb, [employer]. Patient reports the fetal movement as active. Patient reports uterine contraction  activity as none. Patient reports  vaginal bleeding as none. Patient describes fluid per vagina as None.  Vitals:  Blood pressure 105/50, pulse 89, temperature 97.9 F (36.6 C), temperature source Oral, resp. rate 18, height 5\' 6"  (1.676 m), weight 71.668 kg (158 lb), last menstrual period 01/15/2013, SpO2 100.00%. Physical Examination:  General appearance - alert, well appearing, and in no distress and normal appearing weight Heart - normal rate and regular rhythm Abdomen - soft, nontender, nondistended Fundal Height:  size equals dates Cervical Exam: Not evaluated. A \\Extremities : extremities normal, atraumatic, no cyanosis or edema and Homans sign is negative, no sign of DVT with DTRs 2+ bilaterally Membranes:intact  Fetal Monitoring:  No contractions  Labs:  No results found for this or any previous visit (from the past 24 hour(s)).  Imaging Studies:     Currently EPIC will not allow sonographic studies to automatically populate into notes.  In the meantime, copy and paste results into note or free text.  Medications:  Scheduled . docusate sodium  100 mg Oral Daily  . ferrous gluconate  324 mg Oral Q breakfast  . prenatal multivitamin  1 tablet Oral Q1200  . sodium chloride  3 mL Intravenous Q12H   I have reviewed the patient's current medications.  ASSESSMENT: Patient Active  Problem List   Diagnosis Date Noted  . Vaginal bleeding in pregnancy 07/15/2013  . Anemia, iron deficiency 06/22/2013  . Thrombocytopenia complicating pregnancy 33/35/4562  . Supervision of low-risk pregnancy 05/26/2013  . Abdominal pain, LLQ (left lower quadrant), most likely at stitch from abd wall reconstruction 11/09/2012  . Endometriosis of abdominal wall s/p excision [id number] 07/31/2011    PLAN: 7 day hosp stay after bleeding episode. Then d/c as outpt. Given work note to be out til 1 feb/  Dianely Krehbiel V 07/17/2013,7:44 AM

## 2013-07-18 LAB — TYPE AND SCREEN
ABO/RH(D): B POS
Antibody Screen: NEGATIVE

## 2013-07-18 NOTE — Progress Notes (Signed)
Patient ID: NANCYLEE GAINES, female   DOB: 12-24-1985, 28 y.o.   MRN: [id number] [place]) NOTE  LATACHA TEXEIRA is a 28 y.o. G1P0 at [redacted]w[redacted]d by LMP who is admitted for vaginal bleeding.   Fetal presentation is cephalic. Length of Stay:  4  Days  Subjective: S/p sharp pain on occasion Patient reports the fetal movement as active. Patient reports uterine contraction  activity as none. Patient reports  vaginal bleeding as none. Patient describes fluid per vagina as None.  Vitals:  Blood pressure 107/51, pulse 91, temperature 99.4 F (37.4 C), temperature source Oral, resp. rate 18, height 5\' 6"  (1.676 m), weight 158 lb (71.668 kg), last menstrual period 01/15/2013, SpO2 100.00%. Physical Examination:  General appearance - alert, well appearing, and in no distress Heart - normal rate and regular rhythm Abdomen - soft, nontender, nondistended Fundal Height:  size equals dates Cervical Exam: Not evaluated. Extremities: extremities normal Membranes:intact  Fetal Monitoring:  Baseline: 140 bpm, Variability: Good {> 6 bpm), Accelerations: Reactive and Decelerations: possible rare variable  Labs:  No results found for this or any previous visit (from the past 24 hour(s)).  Imaging Studies:     Currently EPIC will not allow sonographic studies to automatically populate into notes.  In the meantime, copy and paste results into note or free text.  Medications:  Scheduled . docusate sodium  100 mg Oral Daily  . ferrous gluconate  324 mg Oral Q breakfast  . prenatal multivitamin  1 tablet Oral Q1200  . sodium chloride  3 mL Intravenous Q12H   I have reviewed the patient's current medications.  ASSESSMENT: Patient Active Problem List   Diagnosis Date Noted  . Vaginal bleeding in pregnancy 07/15/2013  . Anemia, iron deficiency 06/22/2013  . Thrombocytopenia complicating pregnancy 15/17/6160  . Supervision of low-risk pregnancy 05/26/2013  .  Abdominal pain, LLQ (left lower quadrant), most likely at stitch from abd wall reconstruction 11/09/2012  . Endometriosis of abdominal wall s/p excision [id number] 07/31/2011    PLAN: 7 day hosp stay after bleeding episode. Then d/c as outpt.   ARNOLD,JAMES 07/18/2013,7:50 AM

## 2013-07-19 NOTE — Progress Notes (Signed)
FACULTY PRACTICE ANTEPARTUM(COMPREHENSIVE) NOTE  Megan Coleman is a 28 y.o. G1P0 at [redacted]w[redacted]d by early ultrasound who is admitted for third trimester bleeding without u/s evidence of abruption.  Pt has not bled since admit, now hosp day 5.   Fetal presentation is cephalic. Length of Stay:  5  Days  Subjective: No complaints Patient reports the fetal movement as active. Patient reports uterine contraction  activity as none. Patient reports  vaginal bleeding as none. Patient describes fluid per vagina as None.  Vitals:  Blood pressure 116/61, pulse 96, temperature 98.2 F (36.8 C), temperature source Oral, resp. rate 18, height 5\' 6"  (1.676 m), weight 71.668 kg (158 lb), last menstrual period 01/15/2013, SpO2 100.00%. Physical Examination:  General appearance - alert, well appearing, and in no distress Heart - normal rate and regular rhythm Abdomen - soft, nontender, nondistended Fundal Height:  size equals dates Cervical Exam: Not evaluated.  Extremities: extremities normal, atraumatic, no cyanosis or edema and Homans sign is negative, no sign of DVT  Membranes:intact  Fetal Monitoring:  q shift, to be done soon  Labs:  Results for orders placed during the hospital encounter of 07/14/13 (from the past 24 hour(s))  TYPE AND SCREEN   Collection Time    07/18/13  8:55 AM      Result Value Range   ABO/RH(D) B POS     Antibody Screen NEG     Sample Expiration 07/21/2013      Imaging Studies:     Currently EPIC will not allow sonographic studies to automatically populate into notes.  In the meantime, copy and paste results into note or free text.  Medications:  Scheduled . docusate sodium  100 mg Oral Daily  . ferrous gluconate  324 mg Oral Q breakfast  . prenatal multivitamin  1 tablet Oral Q1200  . sodium chloride  3 mL Intravenous Q12H   I have reviewed the patient's current medications.  ASSESSMENT: Patient Active Problem List   Diagnosis Date Noted  . Vaginal  bleeding in pregnancy 07/15/2013  . Anemia, iron deficiency 06/22/2013  . Thrombocytopenia complicating pregnancy 14/78/2956  . Supervision of low-risk pregnancy 05/26/2013  . Abdominal pain, LLQ (left lower quadrant), most likely at stitch from abd wall reconstruction 11/09/2012  . Endometriosis of abdominal wall s/p excision [id number] 07/31/2011    PLAN: Continue inpt status til 7 days, d/c Wednesday  Cannen Dupras V 07/19/2013,7:09 AM

## 2013-07-19 NOTE — Progress Notes (Signed)
Ur chart review completed.  

## 2013-07-20 ENCOUNTER — Encounter: Payer: Medicaid Other | Admitting: Family

## 2013-07-20 NOTE — Progress Notes (Signed)
FACULTY PRACTICE ANTEPARTUM(COMPREHENSIVE) NOTE  Megan Coleman is a 28 y.o. G1P0 at [redacted]w[redacted]d by early ultrasound who is admitted for third trimester bleeding without u/s evidence of abruption.  Pt has not bled since admit, now hosp day 6.   Fetal presentation is cephalic. Length of Stay:  6  Days  Subjective: No complaints Patient reports the fetal movement as active. Patient reports uterine contraction  activity as none. Patient reports  vaginal bleeding as none. Patient describes fluid per vagina as None.  Vitals:  Blood pressure 119/63, pulse 97, temperature 98.4 F (36.9 C), temperature source Oral, resp. rate 18, height 5\' 6"  (1.676 m), weight 71.668 kg (158 lb), last menstrual period 01/15/2013, SpO2 100.00%. Physical Examination: General appearance - alert, well appearing, and in no distress Abdomen - soft, nontender, nondistended Fundal Height:  size equals dates Cervical Exam: Not evaluated.  Extremities: extremities normal, atraumatic, no cyanosis or edema and Homans sign is negative, no sign of DVT  Membranes:intact  Fetal Monitoring:  140s mod var, mult accel >15x15, occassional variable decel  Labs:  No results found for this or any previous visit (from the past 24 hour(s)).  Imaging Studies:     Currently EPIC will not allow sonographic studies to automatically populate into notes.  In the meantime, copy and paste results into note or free text.  Medications:  Scheduled . docusate sodium  100 mg Oral Daily  . ferrous gluconate  324 mg Oral Q breakfast  . prenatal multivitamin  1 tablet Oral Q1200  . sodium chloride  3 mL Intravenous Q12H   I have reviewed the patient's current medications.  ASSESSMENT: Patient Active Problem List   Diagnosis Date Noted  . Vaginal bleeding in pregnancy 07/15/2013  . Anemia, iron deficiency 06/22/2013  . Thrombocytopenia complicating pregnancy 85/63/1497  . Supervision of low-risk pregnancy 05/26/2013  . Abdominal pain,  LLQ (left lower quadrant), most likely at stitch from abd wall reconstruction 11/09/2012  . Endometriosis of abdominal wall s/p excision [id number] 07/31/2011    PLAN: Bleed on 1/21, Continue inpt status til 7 days, d/c Wednesday  Megan Coleman RYAN 07/20/2013,8:29 AM

## 2013-07-21 DIAGNOSIS — D689 Coagulation defect, unspecified: Secondary | ICD-10-CM

## 2013-07-21 DIAGNOSIS — D696 Thrombocytopenia, unspecified: Secondary | ICD-10-CM

## 2013-07-21 DIAGNOSIS — D509 Iron deficiency anemia, unspecified: Secondary | ICD-10-CM

## 2013-07-21 DIAGNOSIS — O4690 Antepartum hemorrhage, unspecified, unspecified trimester: Principal | ICD-10-CM

## 2013-07-21 DIAGNOSIS — O99119 Other diseases of the blood and blood-forming organs and certain disorders involving the immune mechanism complicating pregnancy, unspecified trimester: Secondary | ICD-10-CM

## 2013-07-21 LAB — TYPE AND SCREEN
ABO/RH(D): B POS
ANTIBODY SCREEN: NEGATIVE

## 2013-07-21 NOTE — Discharge Summary (Signed)
Antenatal Physician Discharge Summary  Patient ID: Megan Coleman MRN: [id number] DOB/AGE: Apr 01, 1986 28 y.o.  Admit date: 07/14/2013 Discharge date: 07/21/2013  Admission Diagnoses: bleeding in pregnancy  Discharge Diagnoses: same  Prenatal Procedures: NST and ultrasound  Intrapartum Procedures: none  Significant Diagnostic Studies:  Results for orders placed during the hospital encounter of 07/14/13 (from the past 168 hour(s))  GC/CHLAMYDIA PROBE AMP   Collection Time    07/14/13 11:45 PM      Result Value Range   CT Probe RNA NEGATIVE  NEGATIVE   GC Probe RNA NEGATIVE  NEGATIVE  CBC   Collection Time    07/15/13  5:20 AM      Result Value Range   WBC 5.6  4.0 - 10.5 K/uL   RBC 3.76 (*) 3.87 - 5.11 MIL/uL   Hemoglobin 10.3 (*) 12.0 - 15.0 g/dL   HCT 31.2 (*) 36.0 - 46.0 %   MCV 83.0  78.0 - 100.0 fL   MCH 27.4  26.0 - 34.0 pg   MCHC 33.0  30.0 - 36.0 g/dL   RDW 14.4  11.5 - 15.5 %   Platelets 126 (*) 150 - 400 K/uL  TYPE AND SCREEN   Collection Time    07/15/13  5:20 AM      Result Value Range   ABO/RH(D) B POS     Antibody Screen NEG     Sample Expiration 07/18/2013    TYPE AND SCREEN   Collection Time    07/18/13  8:55 AM      Result Value Range   ABO/RH(D) B POS     Antibody Screen NEG     Sample Expiration 07/21/2013    TYPE AND SCREEN   Collection Time    07/20/13  8:55 AM      Result Value Range   ABO/RH(D) B POS     Antibody Screen NEG     Sample Expiration 07/23/2013      Treatments: observation  Hospital Course:  This is a 28 y.o. G1P0 with IUP at [redacted]w[redacted]d admitted for bleeding at [redacted] weeks gestation.  No leaking of fluid.  She was observed for a week with no further bleeding or problems.  She received betamethasone x 2 doses.  She was observed, fetal heart rate monitoring remained reassuring, and she had no signs/symptoms of PTL labor or other maternal-fetal concerns. She was deemed stable for discharge to home with outpatient follow  up.  Discharge Exam: BP 111/64  Pulse 86  Temp(Src) 98.2 F (36.8 C) (Oral)  Resp 18  Ht 5\' 6"  (1.676 m)  Wt 158 lb (71.668 kg)  BMI 25.51 kg/m2  SpO2 100%  LMP 01/15/2013 FHR 130's +accels, no decels General appearance: alert and no distress Resp: clear to auscultation bilaterally Cardio: regular rate and rhythm, S1, S2 normal, no murmur, click, rub or gallop GI: soft, non-tender; bowel sounds normal; no masses,  no organomegaly and gravid Extremities: extremities normal, atraumatic, no cyanosis or edema  Discharge Condition: good  Disposition: 01-Home or Self Care  Discharge Orders   Future Orders Complete By Expires   Discharge activity:  As directed    Scheduling Instructions:     No lifting; no prolonged standing; no exercise   Discharge diet:  No restrictions  As directed    Do not have sex or do anything that might make you have an orgasm  As directed    Notify physician for a general feeling that "something is not right"  As directed    Notify physician for increase or change in vaginal discharge  As directed    Notify physician for intestinal cramps, with or without diarrhea, sometimes described as "gas pain"  As directed    Notify physician for leaking of fluid  As directed    Notify physician for low, dull backache, unrelieved by heat or Tylenol  As directed    Notify physician for menstrual like cramps  As directed    Notify physician for pelvic pressure  As directed    Notify physician for uterine contractions.  These may be painless and feel like the uterus is tightening or the baby is  "balling up"  As directed    Notify physician for vaginal bleeding  As directed    PRETERM LABOR:  Includes any of the follwing symptoms that occur between 20 - [redacted] weeks gestation.  If these symptoms are not stopped, preterm labor can result in preterm delivery, placing your baby at risk  As directed    Sexual Activity:    As directed    Scheduling Instructions:     No  intercourse       Medication List    STOP taking these medications       ferrous sulfate 325 (65 FE) MG tablet  Commonly known as:  FERROUSUL      TAKE these medications       butalbital-acetaminophen-caffeine 50-325-40 MG per tablet  Commonly known as:  FIORICET  Take 1-2 tablets by mouth every 6 (six) hours as needed for headache.     cyclobenzaprine 10 MG tablet  Commonly known as:  FLEXERIL  Take 1 tablet (10 mg total) by mouth 2 (two) times daily as needed for muscle spasms.     ferrous gluconate 324 MG tablet  Commonly known as:  FERGON  Take 1 tablet (324 mg total) by mouth daily with breakfast.     prenatal multivitamin Tabs tablet  Take 1 tablet by mouth daily at 12 noon.           Follow-up Information   Follow up with ODOM, Lyda Kalata, MD In 2 weeks.   Specialty:  Family Medicine   Contact information:   [address] [place] [id number] [phone]       Signed: Lavonia Drafts M.D. 07/21/2013, 11:16 AM

## 2013-07-21 NOTE — Progress Notes (Signed)
Discharged via wheelchair. Verbalized an understanding of instructions.

## 2013-07-21 NOTE — Discharge Instructions (Signed)
Preterm Labor Information Preterm labor is when labor starts at less than 37 weeks of pregnancy. The normal length of a pregnancy is 39 to 41 weeks. CAUSES Often, there is no identifiable underlying cause as to why a woman goes into preterm labor. One of the most common known causes of preterm labor is infection. Infections of the uterus, cervix, vagina, amniotic sac, bladder, kidney, or even the lungs (pneumonia) can cause labor to start. Other suspected causes of preterm labor include:   Urogenital infections, such as yeast infections and bacterial vaginosis.   Uterine abnormalities (uterine shape, uterine septum, fibroids, or bleeding from the placenta).   A cervix that has been operated on (it may fail to stay closed).   Malformations in the fetus.   Multiple gestations (twins, triplets, and so on).   Breakage of the amniotic sac.  RISK FACTORS  Having a previous history of preterm labor.   Having premature rupture of membranes (PROM).   Having a placenta that covers the opening of the cervix (placenta previa).   Having a placenta that separates from the uterus (placental abruption).   Having a cervix that is too weak to hold the fetus in the uterus (incompetent cervix).   Having too much fluid in the amniotic sac (polyhydramnios).   Taking illegal drugs or smoking while pregnant.   Not gaining enough weight while pregnant.   Being younger than 77 and older than 27 years old.   Having a low socioeconomic status.   Being African American. SYMPTOMS Signs and symptoms of preterm labor include:   Menstrual-like cramps, abdominal pain, or back pain.  Uterine contractions that are regular, as frequent as six in an hour, regardless of their intensity (may be mild or painful).  Contractions that start on the top of the uterus and spread down to the lower abdomen and back.   A sense of increased pelvic pressure.   A watery or bloody mucus discharge that  comes from the vagina.  TREATMENT Depending on the length of the pregnancy and other circumstances, your health care provider may suggest bed rest. If necessary, there are medicines that can be given to stop contractions and to mature the fetal lungs. If labor happens before 34 weeks of pregnancy, a prolonged hospital stay may be recommended. Treatment depends on the condition of both you and the fetus.  WHAT SHOULD YOU DO IF YOU THINK YOU ARE IN PRETERM LABOR? Call your health care provider right away. You will need to go to the hospital to get checked immediately. HOW CAN YOU PREVENT PRETERM LABOR IN FUTURE PREGNANCIES? You should:   Stop smoking if you smoke.  Maintain healthy weight gain and avoid chemicals and drugs that are not necessary.  Be watchful for any type of infection.  Inform your health care provider if you have a known history of preterm labor. Document Released: 08/31/2003 Document Revised: 02/10/2013 Document Reviewed: 07/13/2012 [facility] Patient Information 2014 [place], [place]. Vaginal Bleeding During Pregnancy, Second Trimester A small amount of bleeding (spotting) from the vagina is relatively common in pregnancy. It usually stops on its own. Various things can cause bleeding or spotting in pregnancy. Some bleeding may be related to the pregnancy, and some may not. Sometimes the bleeding is normal and is not a problem. However, bleeding can also be a sign of something serious. Be sure to tell your health care provider about any vaginal bleeding right away. Some possible causes of vaginal bleeding during the second trimester include:  Infection,  inflammation, or growths on the cervix.   The placenta may be partially or completely covering the opening of the cervix inside the uterus (placenta previa).  The placenta may have separated from the uterus (abruption of the placenta).   You may be having early (preterm) labor.   The cervix may not be strong enough to  keep a baby inside the uterus (cervical insufficiency).   Tiny cysts may have developed in the uterus instead of pregnancy tissue (molar pregnancy). HOME CARE INSTRUCTIONS  Watch your condition for any changes. The following actions may help to lessen any discomfort you are feeling:  Follow your health care provider's instructions for limiting your activity. If your health care provider orders bed rest, you may need to stay in bed and only get up to use the bathroom. However, your health care provider may allow you to continue light activity.  If needed, make plans for someone to help with your regular activities and responsibilities while you are on bed rest.  Keep track of the number of pads you use each day, how often you change pads, and how soaked (saturated) they are. Write this down.  Do not use tampons. Do not douche.  Do not have sexual intercourse or orgasms until approved by your health care provider.  If you pass any tissue from your vagina, save the tissue so you can show it to your health care provider.  Only take over-the-counter or prescription medicines as directed by your health care provider.  Do not take aspirin because it can make you bleed.  Do not exercise or perform any strenuous activities or heavy lifting without your health care provider's permission.  Keep all follow-up appointments as directed by your health care provider. SEEK MEDICAL CARE IF:  You have any vaginal bleeding during any part of your pregnancy.  You have cramps or labor pains. SEEK IMMEDIATE MEDICAL CARE IF:   You have severe cramps in your back or belly (abdomen).  You have contractions.  You have a fever, not controlled by medicine.  You have chills.  You pass large clots or tissue from your vagina.  Your bleeding increases.  You feel lightheaded or weak, or you have fainting episodes.  You are leaking fluid or have a gush of fluid from your vagina. MAKE SURE  YOU:  Understand these instructions.  Will watch your condition.  Will get help right away if you are not doing well or get worse. Document Released: 03/20/2005 Document Revised: 03/31/2013 Document Reviewed: 02/15/2013 [facility] Patient Information 2014 [place].    Vaginal Bleeding During Pregnancy A small amount of bleeding from the vagina can happen anytime during pregnancy. It usually stops on its own. However, some bleeding can be serious. Be sure to tell your doctor about all vaginal bleeding. HOME CARE  Get plenty of rest and sleep.  Stay in bed and only get up to go to the bathroom as told by your doctor.  Write down the number of pads you use each day. Note how soaked they are.  Do not use tampons. Do not clean the vagina with a stream of water (douche).  Do not have sex (intercourse) or put anything into your vagina. Have this approved by your doctor.  Save any tissue that comes from your vagina. Show it to your doctor.  Only take medicine as told by your doctor.  Follow your doctor's advice about lifting, driving, and physical activity. GET HELP RIGHT AWAY IF:   You feel your  baby move less or not at all.  You pass out (faint) while going to the bathroom.  You have more bleeding.  You start to have contractions.  You have severe cramps in your stomach, back, or belly (abdomen).  You are leaking fluid or have a gush of fluid from your vagina.  You become lightheaded or weak.  You have chills.  You have clumps of tissue or blood clots coming from your vagina.  You have a fever. MAKE SURE YOU:   Understand these instructions.  Will watch your condition.  Will get help right away if you are not doing well or get worse. Document Released: 03/19/2008 Document Revised: 05/27/2012 Document Reviewed: 03/30/2012 [facility] Patient Information 2014 [place]

## 2013-07-22 ENCOUNTER — Encounter: Payer: Self-pay | Admitting: Obstetrics & Gynecology

## 2013-08-05 ENCOUNTER — Ambulatory Visit (INDEPENDENT_AMBULATORY_CARE_PROVIDER_SITE_OTHER): Payer: Medicaid Other | Admitting: Obstetrics and Gynecology

## 2013-08-05 ENCOUNTER — Encounter: Payer: Self-pay | Admitting: Obstetrics and Gynecology

## 2013-08-05 VITALS — BP 120/72 | Temp 97.5°F | Wt 163.0 lb

## 2013-08-05 DIAGNOSIS — D509 Iron deficiency anemia, unspecified: Secondary | ICD-10-CM

## 2013-08-05 DIAGNOSIS — D696 Thrombocytopenia, unspecified: Secondary | ICD-10-CM

## 2013-08-05 DIAGNOSIS — Z3490 Encounter for supervision of normal pregnancy, unspecified, unspecified trimester: Secondary | ICD-10-CM

## 2013-08-05 DIAGNOSIS — Z23 Encounter for immunization: Secondary | ICD-10-CM

## 2013-08-05 DIAGNOSIS — D689 Coagulation defect, unspecified: Secondary | ICD-10-CM

## 2013-08-05 DIAGNOSIS — O99119 Other diseases of the blood and blood-forming organs and certain disorders involving the immune mechanism complicating pregnancy, unspecified trimester: Principal | ICD-10-CM

## 2013-08-05 LAB — CBC
HCT: 31.1 % — ABNORMAL LOW (ref 36.0–46.0)
Hemoglobin: 10.3 g/dL — ABNORMAL LOW (ref 12.0–15.0)
MCH: 27.8 pg (ref 26.0–34.0)
MCHC: 33.1 g/dL (ref 30.0–36.0)
MCV: 84.1 fL (ref 78.0–100.0)
Platelets: 130 10*3/uL — ABNORMAL LOW (ref 150–400)
RBC: 3.7 MIL/uL — AB (ref 3.87–5.11)
RDW: 14.7 % (ref 11.5–15.5)
WBC: 3.2 10*3/uL — AB (ref 4.0–10.5)

## 2013-08-05 LAB — POCT URINALYSIS DIP (DEVICE)
BILIRUBIN URINE: NEGATIVE
Glucose, UA: NEGATIVE mg/dL
Hgb urine dipstick: NEGATIVE
KETONES UR: NEGATIVE mg/dL
LEUKOCYTES UA: NEGATIVE
Nitrite: NEGATIVE
PROTEIN: NEGATIVE mg/dL
SPECIFIC GRAVITY, URINE: 1.02 (ref 1.005–1.030)
Urobilinogen, UA: 0.2 mg/dL (ref 0.0–1.0)
pH: 7 (ref 5.0–8.0)

## 2013-08-05 MED ORDER — TETANUS-DIPHTH-ACELL PERTUSSIS 5-2.5-18.5 LF-MCG/0.5 IM SUSP: 0.5000 mL | Freq: Once | INTRAMUSCULAR | Status: DC

## 2013-08-05 NOTE — Progress Notes (Signed)
P=120,   Denies any vaginal bleeding since discharged from hospital after admission for vaginal bleeding.

## 2013-08-05 NOTE — Addendum Note (Signed)
Addended by: Ernie Avena on: 08/05/2013 12:20 PM   Modules accepted: Orders

## 2013-08-05 NOTE — Progress Notes (Signed)
Patient is doing well without complaints. FM/PTL precautions reviewed. No further episodes of vaginal bleeding since discharge. 1hr GCT and labs today

## 2013-08-06 LAB — HIV ANTIBODY (ROUTINE TESTING W REFLEX): HIV: NONREACTIVE

## 2013-08-06 LAB — GLUCOSE TOLERANCE, 1 HOUR (50G) W/O FASTING: GLUCOSE 1 HOUR GTT: 111 mg/dL (ref 70–140)

## 2013-08-06 LAB — RPR

## 2013-08-08 ENCOUNTER — Encounter: Payer: Self-pay | Admitting: Obstetrics and Gynecology

## 2013-08-26 ENCOUNTER — Ambulatory Visit (INDEPENDENT_AMBULATORY_CARE_PROVIDER_SITE_OTHER): Payer: Medicaid Other | Admitting: Family

## 2013-08-26 VITALS — BP 110/70 | Temp 97.5°F | Wt 165.7 lb

## 2013-08-26 DIAGNOSIS — Z3480 Encounter for supervision of other normal pregnancy, unspecified trimester: Secondary | ICD-10-CM

## 2013-08-26 DIAGNOSIS — Z3490 Encounter for supervision of normal pregnancy, unspecified, unspecified trimester: Secondary | ICD-10-CM

## 2013-08-26 DIAGNOSIS — O99119 Other diseases of the blood and blood-forming organs and certain disorders involving the immune mechanism complicating pregnancy, unspecified trimester: Secondary | ICD-10-CM

## 2013-08-26 DIAGNOSIS — D696 Thrombocytopenia, unspecified: Secondary | ICD-10-CM

## 2013-08-26 DIAGNOSIS — D689 Coagulation defect, unspecified: Secondary | ICD-10-CM

## 2013-08-26 LAB — POCT URINALYSIS DIP (DEVICE)
BILIRUBIN URINE: NEGATIVE
GLUCOSE, UA: NEGATIVE mg/dL
Hgb urine dipstick: NEGATIVE
Ketones, ur: NEGATIVE mg/dL
Leukocytes, UA: NEGATIVE
Nitrite: NEGATIVE
PROTEIN: NEGATIVE mg/dL
SPECIFIC GRAVITY, URINE: 1.015 (ref 1.005–1.030)
Urobilinogen, UA: 0.2 mg/dL (ref 0.0–1.0)
pH: 7 (ref 5.0–8.0)

## 2013-08-26 NOTE — Progress Notes (Signed)
Reports some pain around umbilicus; normal BM.  No vaginal bleeding or leaking of fluid.  Braxton hicks 3-4x a day.  Abd - no masses or tenderness around umbilicus.

## 2013-08-26 NOTE — Progress Notes (Signed)
P=128  Pt reports pain in navel since Monday/intermittent/sharp pain/decreased pain today.

## 2013-09-09 ENCOUNTER — Encounter: Payer: Self-pay | Admitting: Family

## 2013-09-09 ENCOUNTER — Ambulatory Visit (INDEPENDENT_AMBULATORY_CARE_PROVIDER_SITE_OTHER): Payer: Medicaid Other | Admitting: Family

## 2013-09-09 VITALS — BP 106/66 | Temp 97.1°F | Wt 171.0 lb

## 2013-09-09 DIAGNOSIS — Z3490 Encounter for supervision of normal pregnancy, unspecified, unspecified trimester: Secondary | ICD-10-CM

## 2013-09-09 DIAGNOSIS — Z3480 Encounter for supervision of other normal pregnancy, unspecified trimester: Secondary | ICD-10-CM

## 2013-09-09 LAB — POCT URINALYSIS DIP (DEVICE)
BILIRUBIN URINE: NEGATIVE
Glucose, UA: NEGATIVE mg/dL
Hgb urine dipstick: NEGATIVE
Ketones, ur: NEGATIVE mg/dL
NITRITE: NEGATIVE
PH: 7 (ref 5.0–8.0)
PROTEIN: NEGATIVE mg/dL
Specific Gravity, Urine: 1.02 (ref 1.005–1.030)
Urobilinogen, UA: 0.2 mg/dL (ref 0.0–1.0)

## 2013-09-09 NOTE — Progress Notes (Signed)
p=83

## 2013-09-09 NOTE — Progress Notes (Signed)
No questions or concerns.  Reviewed 1 hr results.

## 2013-09-23 ENCOUNTER — Encounter: Payer: Self-pay | Admitting: Family Medicine

## 2013-09-23 ENCOUNTER — Ambulatory Visit (INDEPENDENT_AMBULATORY_CARE_PROVIDER_SITE_OTHER): Payer: Medicaid Other | Admitting: Family Medicine

## 2013-09-23 VITALS — BP 116/76 | Temp 98.0°F | Wt 173.0 lb

## 2013-09-23 DIAGNOSIS — Z3480 Encounter for supervision of other normal pregnancy, unspecified trimester: Secondary | ICD-10-CM

## 2013-09-23 DIAGNOSIS — Z9889 Other specified postprocedural states: Secondary | ICD-10-CM | POA: Insufficient documentation

## 2013-09-23 DIAGNOSIS — O0990 Supervision of high risk pregnancy, unspecified, unspecified trimester: Secondary | ICD-10-CM

## 2013-09-23 DIAGNOSIS — D696 Thrombocytopenia, unspecified: Secondary | ICD-10-CM

## 2013-09-23 DIAGNOSIS — D689 Coagulation defect, unspecified: Secondary | ICD-10-CM

## 2013-09-23 DIAGNOSIS — Z3490 Encounter for supervision of normal pregnancy, unspecified, unspecified trimester: Secondary | ICD-10-CM

## 2013-09-23 DIAGNOSIS — Z23 Encounter for immunization: Secondary | ICD-10-CM

## 2013-09-23 DIAGNOSIS — O99119 Other diseases of the blood and blood-forming organs and certain disorders involving the immune mechanism complicating pregnancy, unspecified trimester: Secondary | ICD-10-CM

## 2013-09-23 LAB — POCT URINALYSIS DIP (DEVICE)
Bilirubin Urine: NEGATIVE
Glucose, UA: NEGATIVE mg/dL
Hgb urine dipstick: NEGATIVE
Ketones, ur: NEGATIVE mg/dL
LEUKOCYTES UA: NEGATIVE
Nitrite: NEGATIVE
Protein, ur: NEGATIVE mg/dL
SPECIFIC GRAVITY, URINE: 1.02 (ref 1.005–1.030)
UROBILINOGEN UA: 0.2 mg/dL (ref 0.0–1.0)
pH: 7 (ref 5.0–8.0)

## 2013-09-23 MED ORDER — TETANUS-DIPHTH-ACELL PERTUSSIS 5-2.5-18.5 LF-MCG/0.5 IM SUSP: 0.5000 mL | Freq: Once | INTRAMUSCULAR | Status: DC

## 2013-09-23 NOTE — Progress Notes (Signed)
H/o myomectomy--no records.  Had endometriosis at skin level 5 years later--worrisome for endometrial involvement. Conservatively would schedule C-section at 75 wks--scheduled Needs cultures. TDaP today

## 2013-09-23 NOTE — Progress Notes (Signed)
P=108 Pt complains of pain in lower back/lower abd, instructed in exercises to help with pain.

## 2013-09-23 NOTE — Patient Instructions (Signed)
Third Trimester of Pregnancy The third trimester is from week 29 through week 42, months 7 through 9. The third trimester is a time when the fetus is growing rapidly. At the end of the ninth month, the fetus is about 20 inches in length and weighs 6 10 pounds.  BODY CHANGES Your body goes through many changes during pregnancy. The changes vary from woman to woman.   Your weight will continue to increase. You can expect to gain 25 35 pounds (11 16 kg) by the end of the pregnancy.  You may begin to get stretch marks on your hips, abdomen, and breasts.  You may urinate more often because the fetus is moving lower into your pelvis and pressing on your bladder.  You may develop or continue to have heartburn as a result of your pregnancy.  You may develop constipation because certain hormones are causing the muscles that push waste through your intestines to slow down.  You may develop hemorrhoids or swollen, bulging veins (varicose veins).  You may have pelvic pain because of the weight gain and pregnancy hormones relaxing your joints between the bones in your pelvis. Back aches may result from over exertion of the muscles supporting your posture.  Your breasts will continue to grow and be tender. A yellow discharge may leak from your breasts called colostrum.  Your belly button may stick out.  You may feel short of breath because of your expanding uterus.  You may notice the fetus "dropping," or moving lower in your abdomen.  You may have a bloody mucus discharge. This usually occurs a few days to a week before labor begins.  Your cervix becomes thin and soft (effaced) near your due date. WHAT TO EXPECT AT YOUR PRENATAL EXAMS  You will have prenatal exams every 2 weeks until week 36. Then, you will have weekly prenatal exams. During a routine prenatal visit:  You will be weighed to make sure you and the fetus are growing normally.  Your blood pressure is taken.  Your abdomen will  be measured to track your baby's growth.  The fetal heartbeat will be listened to.  Any test results from the previous visit will be discussed.  You may have a cervical check near your due date to see if you have effaced. At around 36 weeks, your caregiver will check your cervix. At the same time, your caregiver will also perform a test on the secretions of the vaginal tissue. This test is to determine if a type of bacteria, Group B streptococcus, is present. Your caregiver will explain this further. Your caregiver may ask you:  What your birth plan is.  How you are feeling.  If you are feeling the baby move.  If you have had any abnormal symptoms, such as leaking fluid, bleeding, severe headaches, or abdominal cramping.  If you have any questions. Other tests or screenings that may be performed during your third trimester include:  Blood tests that check for low iron levels (anemia).  Fetal testing to check the health, activity level, and growth of the fetus. Testing is done if you have certain medical conditions or if there are problems during the pregnancy. FALSE LABOR You may feel small, irregular contractions that eventually go away. These are called Braxton Hicks contractions, or false labor. Contractions may last for hours, days, or even weeks before true labor sets in. If contractions come at regular intervals, intensify, or become painful, it is best to be seen by your caregiver.    SIGNS OF LABOR   Menstrual-like cramps.  Contractions that are 5 minutes apart or less.  Contractions that start on the top of the uterus and spread down to the lower abdomen and back.  A sense of increased pelvic pressure or back pain.  A watery or bloody mucus discharge that comes from the vagina. If you have any of these signs before the 37th week of pregnancy, call your caregiver right away. You need to go to the hospital to get checked immediately. HOME CARE INSTRUCTIONS   Avoid all  smoking, herbs, alcohol, and unprescribed drugs. These chemicals affect the formation and growth of the baby.  Follow your caregiver's instructions regarding medicine use. There are medicines that are either safe or unsafe to take during pregnancy.  Exercise only as directed by your caregiver. Experiencing uterine cramps is a good sign to stop exercising.  Continue to eat regular, healthy meals.  Wear a good support bra for breast tenderness.  Do not use hot tubs, steam rooms, or saunas.  Wear your seat belt at all times when driving.  Avoid raw meat, uncooked cheese, cat litter boxes, and soil used by cats. These carry germs that can cause birth defects in the baby.  Take your prenatal vitamins.  Try taking a stool softener (if your caregiver approves) if you develop constipation. Eat more high-fiber foods, such as fresh vegetables or fruit and whole grains. Drink plenty of fluids to keep your urine clear or pale yellow.  Take warm sitz baths to soothe any pain or discomfort caused by hemorrhoids. Use hemorrhoid cream if your caregiver approves.  If you develop varicose veins, wear support hose. Elevate your feet for 15 minutes, 3 4 times a day. Limit salt in your diet.  Avoid heavy lifting, wear low heal shoes, and practice good posture.  Rest a lot with your legs elevated if you have leg cramps or low back pain.  Visit your dentist if you have not gone during your pregnancy. Use a soft toothbrush to brush your teeth and be gentle when you floss.  A sexual relationship may be continued unless your caregiver directs you otherwise.  Do not travel far distances unless it is absolutely necessary and only with the approval of your caregiver.  Take prenatal classes to understand, practice, and ask questions about the labor and delivery.  Make a trial run to the hospital.  Pack your hospital bag.  Prepare the baby's nursery.  Continue to go to all your prenatal visits as directed  by your caregiver. SEEK MEDICAL CARE IF:  You are unsure if you are in labor or if your water has broken.  You have dizziness.  You have mild pelvic cramps, pelvic pressure, or nagging pain in your abdominal area.  You have persistent nausea, vomiting, or diarrhea.  You have a bad smelling vaginal discharge.  You have pain with urination. SEEK IMMEDIATE MEDICAL CARE IF:   You have a fever.  You are leaking fluid from your vagina.  You have spotting or bleeding from your vagina.  You have severe abdominal cramping or pain.  You have rapid weight loss or gain.  You have shortness of breath with chest pain.  You notice sudden or extreme swelling of your face, hands, ankles, feet, or legs.  You have not felt your baby move in over an hour.  You have severe headaches that do not go away with medicine.  You have vision changes. Document Released: 06/04/2001 Document Revised: 02/10/2013 Document Reviewed:   08/11/2012 [employer] Patient Information 2014 [employer].  Breastfeeding Deciding to breastfeed is one of the best choices you can make for you and your baby. A change in hormones during pregnancy causes your breast tissue to grow and increases the number and size of your milk ducts. These hormones also allow proteins, sugars, and fats from your blood supply to make breast milk in your milk-producing glands. Hormones prevent breast milk from being released before your baby is born as well as prompt milk flow after birth. Once breastfeeding has begun, thoughts of your baby, as well as his or her sucking or crying, can stimulate the release of milk from your milk-producing glands.  BENEFITS OF BREASTFEEDING For Your Baby  Your first milk (colostrum) helps your baby's digestive system function better.   There are antibodies in your milk that help your baby fight off infections.   Your baby has a lower incidence of asthma, allergies, and sudden infant death syndrome.    The nutrients in breast milk are better for your baby than infant formulas and are designed uniquely for your baby's needs.   Breast milk improves your baby's brain development.   Your baby is less likely to develop other conditions, such as childhood obesity, asthma, or type 2 diabetes mellitus.  For You   Breastfeeding helps to create a very special bond between you and your baby.   Breastfeeding is convenient. Breast milk is always available at the correct temperature and costs nothing.   Breastfeeding helps to burn calories and helps you lose the weight gained during pregnancy.   Breastfeeding makes your uterus contract to its prepregnancy size faster and slows bleeding (lochia) after you give birth.   Breastfeeding helps to lower your risk of developing type 2 diabetes mellitus, osteoporosis, and breast or ovarian cancer later in life. SIGNS THAT YOUR BABY IS HUNGRY Early Signs of Hunger  Increased alertness or activity.  Stretching.  Movement of the head from side to side.  Movement of the head and opening of the mouth when the corner of the mouth or cheek is stroked (rooting).  Increased sucking sounds, smacking lips, cooing, sighing, or squeaking.  Hand-to-mouth movements.  Increased sucking of fingers or hands. Late Signs of Hunger  Fussing.  Intermittent crying. Extreme Signs of Hunger Signs of extreme hunger will require calming and consoling before your baby will be able to breastfeed successfully. Do not wait for the following signs of extreme hunger to occur before you initiate breastfeeding:   Restlessness.  A loud, strong cry.   Screaming. BREASTFEEDING BASICS Breastfeeding Initiation  Find a comfortable place to sit or lie down, with your neck and back well supported.  Place a pillow or rolled up blanket under your baby to bring him or her to the level of your breast (if you are seated). Nursing pillows are specially designed to help  support your arms and your baby while you breastfeed.  Make sure that your baby's abdomen is facing your abdomen.   Gently massage your breast. With your fingertips, massage from your chest wall toward your nipple in a circular motion. This encourages milk flow. You may need to continue this action during the feeding if your milk flows slowly.  Support your breast with 4 fingers underneath and your thumb above your nipple. Make sure your fingers are well away from your nipple and your baby's mouth.   Stroke your baby's lips gently with your finger or nipple.   When your baby's mouth is   open wide enough, quickly bring your baby to your breast, placing your entire nipple and as much of the colored area around your nipple (areola) as possible into your baby's mouth.   More areola should be visible above your baby's upper lip than below the lower lip.   Your baby's tongue should be between his or her lower gum and your breast.   Ensure that your baby's mouth is correctly positioned around your nipple (latched). Your baby's lips should create a seal on your breast and be turned out (everted).  It is common for your baby to suck about 2 3 minutes in order to start the flow of breast milk. Latching Teaching your baby how to latch on to your breast properly is very important. An improper latch can cause nipple pain and decreased milk supply for you and poor weight gain in your baby. Also, if your baby is not latched onto your nipple properly, he or she may swallow some air during feeding. This can make your baby fussy. Burping your baby when you switch breasts during the feeding can help to get rid of the air. However, teaching your baby to latch on properly is still the best way to prevent fussiness from swallowing air while breastfeeding. Signs that your baby has successfully latched on to your nipple:    Silent tugging or silent sucking, without causing you pain.   Swallowing heard  between every 3 4 sucks.    Muscle movement above and in front of his or her ears while sucking.  Signs that your baby has not successfully latched on to nipple:   Sucking sounds or smacking sounds from your baby while breastfeeding.  Nipple pain. If you think your baby has not latched on correctly, slip your finger into the corner of your baby's mouth to break the suction and place it between your baby's gums. Attempt breastfeeding initiation again. Signs of Successful Breastfeeding Signs from your baby:   A gradual decrease in the number of sucks or complete cessation of sucking.   Falling asleep.   Relaxation of his or her body.   Retention of a small amount of milk in his or her mouth.   Letting go of your breast by himself or herself. Signs from you:  Breasts that have increased in firmness, weight, and size 1 3 hours after feeding.   Breasts that are softer immediately after breastfeeding.  Increased milk volume, as well as a change in milk consistency and color by the 5th day of breastfeeding.   Nipples that are not sore, cracked, or bleeding. Signs That Your Baby is Getting Enough Milk  Wetting at least 3 diapers in a 24-hour period. The urine should be clear and pale yellow by age 5 days.  At least 3 stools in a 24-hour period by age 5 days. The stool should be soft and yellow.  At least 3 stools in a 24-hour period by age 7 days. The stool should be seedy and yellow.  No loss of weight greater than 10% of birth weight during the first 3 days of age.  Average weight gain of 4 7 ounces (120 210 mL) per week after age 4 days.  Consistent daily weight gain by age 5 days, without weight loss after the age of 2 weeks. After a feeding, your baby may spit up a small amount. This is common. BREASTFEEDING FREQUENCY AND DURATION Frequent feeding will help you make more milk and can prevent sore nipples and breast engorgement.   Breastfeed when you feel the need to  reduce the fullness of your breasts or when your baby shows signs of hunger. This is called "breastfeeding on demand." Avoid introducing a pacifier to your baby while you are working to establish breastfeeding (the first 4 6 weeks after your baby is born). After this time you may choose to use a pacifier. Research has shown that pacifier use during the first year of a baby's life decreases the risk of sudden infant death syndrome (SIDS). Allow your baby to feed on each breast as long as he or she wants. Breastfeed until your baby is finished feeding. When your baby unlatches or falls asleep while feeding from the first breast, offer the second breast. Because newborns are often sleepy in the first few weeks of life, you may need to awaken your baby to get him or her to feed. Breastfeeding times will vary from baby to baby. However, the following rules can serve as a guide to help you ensure that your baby is properly fed:  Newborns (babies 4 weeks of age or younger) may breastfeed every 1 3 hours.  Newborns should not go longer than 3 hours during the day or 5 hours during the night without breastfeeding.  You should breastfeed your baby a minimum of 8 times in a 24-hour period until you begin to introduce solid foods to your baby at around 6 months of age. BREAST MILK PUMPING Pumping and storing breast milk allows you to ensure that your baby is exclusively fed your breast milk, even at times when you are unable to breastfeed. This is especially important if you are going back to work while you are still breastfeeding or when you are not able to be present during feedings. Your lactation consultant can give you guidelines on how long it is safe to store breast milk.  A breast pump is a machine that allows you to pump milk from your breast into a sterile bottle. The pumped breast milk can then be stored in a refrigerator or freezer. Some breast pumps are operated by hand, while others use electricity. Ask  your lactation consultant which type will work best for you. Breast pumps can be purchased, but some hospitals and breastfeeding support groups lease breast pumps on a monthly basis. A lactation consultant can teach you how to hand express breast milk, if you prefer not to use a pump.  CARING FOR YOUR BREASTS WHILE YOU BREASTFEED Nipples can become dry, cracked, and sore while breastfeeding. The following recommendations can help keep your breasts moisturized and healthy:  Avoid using soap on your nipples.   Wear a supportive bra. Although not required, special nursing bras and tank tops are designed to allow access to your breasts for breastfeeding without taking off your entire bra or top. Avoid wearing underwire style bras or extremely tight bras.  Air dry your nipples for 3 4minutes after each feeding.   Use only cotton bra pads to absorb leaked breast milk. Leaking of breast milk between feedings is normal.   Use lanolin on your nipples after breastfeeding. Lanolin helps to maintain your skin's normal moisture barrier. If you use pure lanolin you do not need to wash it off before feeding your baby again. Pure lanolin is not toxic to your baby. You may also hand express a few drops of breast milk and gently massage that milk into your nipples and allow the milk to air dry. In the first few weeks after giving birth, some women   experience extremely full breasts (engorgement). Engorgement can make your breasts feel heavy, warm, and tender to the touch. Engorgement peaks within 3 5 days after you give birth. The following recommendations can help ease engorgement:  Completely empty your breasts while breastfeeding or pumping. You may want to start by applying warm, moist heat (in the shower or with warm water-soaked hand towels) just before feeding or pumping. This increases circulation and helps the milk flow. If your baby does not completely empty your breasts while breastfeeding, pump any extra  milk after he or she is finished.  Wear a snug bra (nursing or regular) or tank top for 1 2 days to signal your body to slightly decrease milk production.  Apply ice packs to your breasts, unless this is too uncomfortable for you.  Make sure that your baby is latched on and positioned properly while breastfeeding. If engorgement persists after 48 hours of following these recommendations, contact your health care provider or a lactation consultant. OVERALL HEALTH CARE RECOMMENDATIONS WHILE BREASTFEEDING  Eat healthy foods. Alternate between meals and snacks, eating 3 of each per day. Because what you eat affects your breast milk, some of the foods may make your baby more irritable than usual. Avoid eating these foods if you are sure that they are negatively affecting your baby.  Drink milk, fruit juice, and water to satisfy your thirst (about 10 glasses a day).   Rest often, relax, and continue to take your prenatal vitamins to prevent fatigue, stress, and anemia.  Continue breast self-awareness checks.  Avoid chewing and smoking tobacco.  Avoid alcohol and drug use. Some medicines that may be harmful to your baby can pass through breast milk. It is important to ask your health care provider before taking any medicine, including all over-the-counter and prescription medicine as well as vitamin and herbal supplements. It is possible to become pregnant while breastfeeding. If birth control is desired, ask your health care provider about options that will be safe for your baby. SEEK MEDICAL CARE IF:   You feel like you want to stop breastfeeding or have become frustrated with breastfeeding.  You have painful breasts or nipples.  Your nipples are cracked or bleeding.  Your breasts are red, tender, or warm.  You have a swollen area on either breast.  You have a fever or chills.  You have nausea or vomiting.  You have drainage other than breast milk from your nipples.  Your breasts  do not become full before feedings by the 5th day after you give birth.  You feel sad and depressed.  Your baby is too sleepy to eat well.  Your baby is having trouble sleeping.   Your baby is wetting less than 3 diapers in a 24-hour period.  Your baby has less than 3 stools in a 24-hour period.  Your baby's skin or the white part of his or her eyes becomes yellow.   Your baby is not gaining weight by 5 days of age. SEEK IMMEDIATE MEDICAL CARE IF:   Your baby is overly tired (lethargic) and does not want to wake up and feed.  Your baby develops an unexplained fever. Document Released: 06/10/2005 Document Revised: 02/10/2013 Document Reviewed: 12/02/2012 [employer] Patient Information 2014 [employer].  

## 2013-09-24 LAB — GC/CHLAMYDIA PROBE AMP
CT Probe RNA: NEGATIVE
GC Probe RNA: NEGATIVE

## 2013-09-26 LAB — CULTURE, STREPTOCOCCUS GRP B W/SUSCEPT

## 2013-09-27 ENCOUNTER — Encounter: Payer: Self-pay | Admitting: Family Medicine

## 2013-09-30 NOTE — Progress Notes (Signed)
Pt. Came to front desk today stating she received a phone call about her C-section being tomorrow and has attempted to call them multiple times but has not reached them/gotten information. After chart review, pt. Is scheduled for C-section 1245. Dr. Olevia Bowens called pre-op who stated pt. Should come in at 0845 to have pre-op testing done prior to C-section. Advised pt. To arrive to [facility] to register at 0830. Informed her she will undergo pre-op testing which will take 3-4 hours and thus the reason for having her come in early. Advised pt. That she must be fasting and cannot have anything to eat or drink after midnight. Pt. Verbalizes understanding. Asks for letter for work. Letter stating pt. Is having surgery given to pt. No other questions or concerns.

## 2013-10-01 ENCOUNTER — Encounter (HOSPITAL_COMMUNITY): Payer: Self-pay | Admitting: *Deleted

## 2013-10-01 ENCOUNTER — Inpatient Hospital Stay (HOSPITAL_COMMUNITY)
Admission: RE | Admit: 2013-10-01 | Discharge: 2013-10-03 | DRG: 765 | Disposition: A | Discharge status: Home / Self Care | Payer: Medicaid Other | Source: Ambulatory Visit | Attending: Family Medicine | Admitting: Family Medicine

## 2013-10-01 ENCOUNTER — Encounter (HOSPITAL_COMMUNITY): Payer: Medicaid Other | Admitting: Anesthesiology

## 2013-10-01 ENCOUNTER — Inpatient Hospital Stay (HOSPITAL_COMMUNITY): Payer: Medicaid Other | Admitting: Anesthesiology

## 2013-10-01 ENCOUNTER — Encounter (HOSPITAL_COMMUNITY)
Admission: RE | Disposition: A | Discharge status: Home / Self Care | Payer: Self-pay | Source: Ambulatory Visit | Attending: Family Medicine

## 2013-10-01 ENCOUNTER — Encounter (HOSPITAL_COMMUNITY): Payer: Self-pay | Admitting: Pharmacy Technician

## 2013-10-01 DIAGNOSIS — O9912 Other diseases of the blood and blood-forming organs and certain disorders involving the immune mechanism complicating childbirth: Secondary | ICD-10-CM

## 2013-10-01 DIAGNOSIS — O3490 Maternal care for abnormality of pelvic organ, unspecified, unspecified trimester: Principal | ICD-10-CM | POA: Diagnosis present

## 2013-10-01 DIAGNOSIS — Z98891 History of uterine scar from previous surgery: Secondary | ICD-10-CM

## 2013-10-01 DIAGNOSIS — O9902 Anemia complicating childbirth: Secondary | ICD-10-CM | POA: Diagnosis present

## 2013-10-01 DIAGNOSIS — D571 Sickle-cell disease without crisis: Secondary | ICD-10-CM

## 2013-10-01 DIAGNOSIS — D689 Coagulation defect, unspecified: Secondary | ICD-10-CM

## 2013-10-01 DIAGNOSIS — D696 Thrombocytopenia, unspecified: Secondary | ICD-10-CM | POA: Diagnosis present

## 2013-10-01 LAB — CBC
HEMATOCRIT: 32.1 % — AB (ref 36.0–46.0)
HEMOGLOBIN: 10.7 g/dL — AB (ref 12.0–15.0)
MCH: 27.9 pg (ref 26.0–34.0)
MCHC: 33.3 g/dL (ref 30.0–36.0)
MCV: 83.8 fL (ref 78.0–100.0)
Platelets: 110 10*3/uL — ABNORMAL LOW (ref 150–400)
RBC: 3.83 MIL/uL — ABNORMAL LOW (ref 3.87–5.11)
RDW: 15 % (ref 11.5–15.5)
WBC: 4.1 10*3/uL (ref 4.0–10.5)

## 2013-10-01 LAB — TYPE AND SCREEN
ABO/RH(D): B POS
Antibody Screen: NEGATIVE

## 2013-10-01 LAB — RPR

## 2013-10-01 SURGERY — Surgical Case
Anesthesia: Spinal | Site: Abdomen

## 2013-10-01 MED ORDER — BUPIVACAINE HCL (PF) 0.25 % IJ SOLN
INTRAMUSCULAR | Status: AC
  Filled 2013-10-01: qty 30

## 2013-10-01 MED ORDER — METOCLOPRAMIDE HCL 5 MG/ML IJ SOLN: 10.0000 mg | Freq: Three times a day (TID) | INTRAMUSCULAR | Status: DC | PRN

## 2013-10-01 MED ORDER — OXYCODONE-ACETAMINOPHEN 5-325 MG PO TABS
1.0000 | ORAL_TABLET | ORAL | Status: DC | PRN
  Administered 2013-10-02: 2 via ORAL
  Administered 2013-10-03 (×2): 1 via ORAL
  Filled 2013-10-01 (×2): qty 1
  Filled 2013-10-01: qty 2

## 2013-10-01 MED ORDER — WITCH HAZEL-GLYCERIN EX PADS: 1.0000 "application " | MEDICATED_PAD | CUTANEOUS | Status: DC | PRN

## 2013-10-01 MED ORDER — BUPIVACAINE HCL (PF) 0.25 % IJ SOLN
INTRAMUSCULAR | Status: DC | PRN
  Administered 2013-10-01: 30 mL

## 2013-10-01 MED ORDER — LACTATED RINGERS IV SOLN
40.0000 [IU] | INTRAVENOUS | Status: DC | PRN
  Administered 2013-10-01: 40 [IU] via INTRAVENOUS

## 2013-10-01 MED ORDER — LACTATED RINGERS IV SOLN
INTRAVENOUS | Status: DC
  Administered 2013-10-01 (×3): via INTRAVENOUS

## 2013-10-01 MED ORDER — PHENYLEPHRINE 8 MG IN D5W 100 ML (0.08MG/ML) PREMIX OPTIME
INJECTION | INTRAVENOUS | Status: DC | PRN
Start: 2013-10-01 — End: 2013-10-01
  Administered 2013-10-01: 60 ug/min via INTRAVENOUS

## 2013-10-01 MED ORDER — SIMETHICONE 80 MG PO CHEW
80.0000 mg | CHEWABLE_TABLET | ORAL | Status: DC | PRN
Start: 2013-10-01 — End: 2013-10-03

## 2013-10-01 MED ORDER — SCOPOLAMINE 1 MG/3DAYS TD PT72
1.0000 | MEDICATED_PATCH | Freq: Once | TRANSDERMAL | Status: DC
  Administered 2013-10-01: 1.5 mg via TRANSDERMAL

## 2013-10-01 MED ORDER — NALBUPHINE HCL 10 MG/ML IJ SOLN: 5.0000 mg | INTRAMUSCULAR | Status: DC | PRN

## 2013-10-01 MED ORDER — MORPHINE SULFATE (PF) 0.5 MG/ML IJ SOLN
INTRAMUSCULAR | Status: DC | PRN
  Administered 2013-10-01: .15 mg via INTRATHECAL

## 2013-10-01 MED ORDER — CEFAZOLIN SODIUM-DEXTROSE 2-3 GM-% IV SOLR
INTRAVENOUS | Status: AC
  Filled 2013-10-01: qty 50

## 2013-10-01 MED ORDER — MEPERIDINE HCL 25 MG/ML IJ SOLN: 6.2500 mg | INTRAMUSCULAR | Status: DC | PRN

## 2013-10-01 MED ORDER — KETOROLAC TROMETHAMINE 30 MG/ML IJ SOLN: 30.0000 mg | Freq: Four times a day (QID) | INTRAMUSCULAR | Status: AC | PRN

## 2013-10-01 MED ORDER — FENTANYL CITRATE 0.05 MG/ML IJ SOLN
INTRAMUSCULAR | Status: DC | PRN
  Administered 2013-10-01: 25 ug via INTRATHECAL

## 2013-10-01 MED ORDER — MEASLES, MUMPS & RUBELLA VAC ~~LOC~~ INJ: 0.5000 mL | INJECTION | Freq: Once | SUBCUTANEOUS | Status: DC

## 2013-10-01 MED ORDER — SCOPOLAMINE 1 MG/3DAYS TD PT72
MEDICATED_PATCH | TRANSDERMAL | Status: AC
  Administered 2013-10-01: 1.5 mg via TRANSDERMAL
  Filled 2013-10-01: qty 1

## 2013-10-01 MED ORDER — SENNOSIDES-DOCUSATE SODIUM 8.6-50 MG PO TABS
2.0000 | ORAL_TABLET | ORAL | Status: DC
  Administered 2013-10-01 – 2013-10-03 (×2): 2 via ORAL
  Filled 2013-10-01 (×2): qty 2

## 2013-10-01 MED ORDER — DIBUCAINE 1 % RE OINT: 1.0000 "application " | TOPICAL_OINTMENT | RECTAL | Status: DC | PRN

## 2013-10-01 MED ORDER — MORPHINE SULFATE 0.5 MG/ML IJ SOLN
INTRAMUSCULAR | Status: AC
  Filled 2013-10-01: qty 10

## 2013-10-01 MED ORDER — LANOLIN HYDROUS EX OINT: 1.0000 "application " | TOPICAL_OINTMENT | CUTANEOUS | Status: DC | PRN

## 2013-10-01 MED ORDER — MAGNESIUM HYDROXIDE 400 MG/5ML PO SUSP: 30.0000 mL | ORAL | Status: DC | PRN

## 2013-10-01 MED ORDER — ONDANSETRON HCL 4 MG/2ML IJ SOLN: 4.0000 mg | Freq: Three times a day (TID) | INTRAMUSCULAR | Status: DC | PRN

## 2013-10-01 MED ORDER — ONDANSETRON HCL 4 MG/2ML IJ SOLN
INTRAMUSCULAR | Status: DC | PRN
  Administered 2013-10-01: 4 mg via INTRAVENOUS

## 2013-10-01 MED ORDER — DIPHENHYDRAMINE HCL 50 MG/ML IJ SOLN: 25.0000 mg | INTRAMUSCULAR | Status: DC | PRN

## 2013-10-01 MED ORDER — IBUPROFEN 600 MG PO TABS
600.0000 mg | ORAL_TABLET | Freq: Four times a day (QID) | ORAL | Status: DC
  Administered 2013-10-01 – 2013-10-03 (×7): 600 mg via ORAL
  Filled 2013-10-01 (×7): qty 1

## 2013-10-01 MED ORDER — FENTANYL CITRATE 0.05 MG/ML IJ SOLN
INTRAMUSCULAR | Status: AC
Start: 2013-10-01 — End: 2013-10-01
  Administered 2013-10-01: 50 ug via INTRAVENOUS
  Filled 2013-10-01: qty 2

## 2013-10-01 MED ORDER — SODIUM CHLORIDE 0.9 % IJ SOLN: 3.0000 mL | INTRAMUSCULAR | Status: DC | PRN

## 2013-10-01 MED ORDER — OXYTOCIN 10 UNIT/ML IJ SOLN
INTRAMUSCULAR | Status: AC
  Filled 2013-10-01: qty 4

## 2013-10-01 MED ORDER — DIPHENHYDRAMINE HCL 25 MG PO CAPS: 25.0000 mg | ORAL_CAPSULE | Freq: Four times a day (QID) | ORAL | Status: DC | PRN

## 2013-10-01 MED ORDER — MEPERIDINE HCL 25 MG/ML IJ SOLN
INTRAMUSCULAR | Status: DC | PRN
  Administered 2013-10-01 (×2): 12.5 mg via INTRAVENOUS

## 2013-10-01 MED ORDER — NALOXONE HCL 0.4 MG/ML IJ SOLN: 0.4000 mg | INTRAMUSCULAR | Status: DC | PRN

## 2013-10-01 MED ORDER — PRENATAL MULTIVITAMIN CH
1.0000 | ORAL_TABLET | Freq: Every day | ORAL | Status: DC
  Administered 2013-10-02 – 2013-10-03 (×2): 1 via ORAL
  Filled 2013-10-01 (×2): qty 1

## 2013-10-01 MED ORDER — SIMETHICONE 80 MG PO CHEW
80.0000 mg | CHEWABLE_TABLET | ORAL | Status: DC
  Administered 2013-10-03: 80 mg via ORAL
  Filled 2013-10-01: qty 1

## 2013-10-01 MED ORDER — OXYTOCIN 40 UNITS IN LACTATED RINGERS INFUSION - SIMPLE MED
62.5000 mL/h | INTRAVENOUS | Status: AC
  Administered 2013-10-01: 62.5 mL/h via INTRAVENOUS

## 2013-10-01 MED ORDER — PHENYLEPHRINE 40 MCG/ML (10ML) SYRINGE FOR IV PUSH (FOR BLOOD PRESSURE SUPPORT)
PREFILLED_SYRINGE | INTRAVENOUS | Status: AC
  Filled 2013-10-01: qty 10

## 2013-10-01 MED ORDER — CEFAZOLIN SODIUM-DEXTROSE 2-3 GM-% IV SOLR
2.0000 g | INTRAVENOUS | Status: AC
  Administered 2013-10-01: 2 g via INTRAVENOUS

## 2013-10-01 MED ORDER — PHENYLEPHRINE 8 MG IN D5W 100 ML (0.08MG/ML) PREMIX OPTIME
INJECTION | INTRAVENOUS | Status: AC
  Filled 2013-10-01: qty 100

## 2013-10-01 MED ORDER — SIMETHICONE 80 MG PO CHEW
80.0000 mg | CHEWABLE_TABLET | Freq: Three times a day (TID) | ORAL | Status: DC
  Administered 2013-10-01 – 2013-10-03 (×5): 80 mg via ORAL
  Filled 2013-10-01 (×6): qty 1

## 2013-10-01 MED ORDER — NALOXONE HCL 1 MG/ML IJ SOLN
1.0000 ug/kg/h | INTRAVENOUS | Status: DC | PRN
  Filled 2013-10-01: qty 2

## 2013-10-01 MED ORDER — LACTATED RINGERS IV SOLN
INTRAVENOUS | Status: DC
  Administered 2013-10-02 (×2): via INTRAVENOUS

## 2013-10-01 MED ORDER — DIPHENHYDRAMINE HCL 25 MG PO CAPS: 25.0000 mg | ORAL_CAPSULE | ORAL | Status: DC | PRN

## 2013-10-01 MED ORDER — FENTANYL CITRATE 0.05 MG/ML IJ SOLN
INTRAMUSCULAR | Status: AC
  Filled 2013-10-01: qty 2

## 2013-10-01 MED ORDER — SCOPOLAMINE 1 MG/3DAYS TD PT72: 1.0000 | MEDICATED_PATCH | Freq: Once | TRANSDERMAL | Status: DC

## 2013-10-01 MED ORDER — PHENYLEPHRINE HCL 10 MG/ML IJ SOLN
INTRAMUSCULAR | Status: DC | PRN
  Administered 2013-10-01: 120 ug via INTRAVENOUS
  Administered 2013-10-01 (×3): 40 ug via INTRAVENOUS

## 2013-10-01 MED ORDER — ONDANSETRON HCL 4 MG/2ML IJ SOLN
INTRAMUSCULAR | Status: AC
  Filled 2013-10-01: qty 2

## 2013-10-01 MED ORDER — TETANUS-DIPHTH-ACELL PERTUSSIS 5-2.5-18.5 LF-MCG/0.5 IM SUSP: 0.5000 mL | Freq: Once | INTRAMUSCULAR | Status: DC

## 2013-10-01 MED ORDER — FENTANYL CITRATE 0.05 MG/ML IJ SOLN
25.0000 ug | INTRAMUSCULAR | Status: DC | PRN
  Administered 2013-10-01 (×2): 50 ug via INTRAVENOUS

## 2013-10-01 MED ORDER — ONDANSETRON HCL 4 MG PO TABS: 4.0000 mg | ORAL_TABLET | ORAL | Status: DC | PRN

## 2013-10-01 MED ORDER — ZOLPIDEM TARTRATE 5 MG PO TABS: 5.0000 mg | ORAL_TABLET | Freq: Every evening | ORAL | Status: DC | PRN

## 2013-10-01 MED ORDER — MEPERIDINE HCL 25 MG/ML IJ SOLN
INTRAMUSCULAR | Status: AC
  Filled 2013-10-01: qty 1

## 2013-10-01 MED ORDER — LACTATED RINGERS IV SOLN: INTRAVENOUS | Status: DC

## 2013-10-01 MED ORDER — DIPHENHYDRAMINE HCL 50 MG/ML IJ SOLN: 12.5000 mg | INTRAMUSCULAR | Status: DC | PRN

## 2013-10-01 MED ORDER — ONDANSETRON HCL 4 MG/2ML IJ SOLN: 4.0000 mg | INTRAMUSCULAR | Status: DC | PRN

## 2013-10-01 MED ORDER — MENTHOL 3 MG MT LOZG: 1.0000 | LOZENGE | OROMUCOSAL | Status: DC | PRN

## 2013-10-01 SURGICAL SUPPLY — 35 items
BARRIER ADHS 3X4 INTERCEED (GAUZE/BANDAGES/DRESSINGS) ×3 IMPLANT
BENZOIN TINCTURE PRP APPL 2/3 (GAUZE/BANDAGES/DRESSINGS) ×3 IMPLANT
CLAMP CORD UMBIL (MISCELLANEOUS) IMPLANT
CLOSURE WOUND 1/2 X4 (GAUZE/BANDAGES/DRESSINGS) ×1
CLOTH BEACON ORANGE TIMEOUT ST (SAFETY) ×3 IMPLANT
DRAPE LG THREE QUARTER DISP (DRAPES) IMPLANT
DRSG OPSITE POSTOP 4X10 (GAUZE/BANDAGES/DRESSINGS) ×3 IMPLANT
DURAPREP 26ML APPLICATOR (WOUND CARE) ×3 IMPLANT
ELECT REM PT RETURN 9FT ADLT (ELECTROSURGICAL) ×3
ELECTRODE REM PT RTRN 9FT ADLT (ELECTROSURGICAL) ×1 IMPLANT
EXTRACTOR VACUUM M CUP 4 TUBE (SUCTIONS) IMPLANT
EXTRACTOR VACUUM M CUP 4' TUBE (SUCTIONS)
GLOVE BIOGEL PI IND STRL 7.5 (GLOVE) ×2 IMPLANT
GLOVE BIOGEL PI INDICATOR 7.5 (GLOVE) ×4
GLOVE ECLIPSE 7.5 STRL STRAW (GLOVE) ×3 IMPLANT
GOWN STRL REUS W/TWL LRG LVL3 (GOWN DISPOSABLE) ×9 IMPLANT
KIT ABG SYR 3ML LUER SLIP (SYRINGE) IMPLANT
NEEDLE HYPO 22GX1.5 SAFETY (NEEDLE) ×3 IMPLANT
NEEDLE HYPO 25X5/8 SAFETYGLIDE (NEEDLE) IMPLANT
NS IRRIG 1000ML POUR BTL (IV SOLUTION) ×3 IMPLANT
PACK C SECTION WH (CUSTOM PROCEDURE TRAY) ×3 IMPLANT
PAD OB MATERNITY 4.3X12.25 (PERSONAL CARE ITEMS) ×3 IMPLANT
RETRACTOR WND ALEXIS 25 LRG (MISCELLANEOUS) ×1 IMPLANT
RTRCTR C-SECT PINK 25CM LRG (MISCELLANEOUS) ×3 IMPLANT
RTRCTR WOUND ALEXIS 25CM LRG (MISCELLANEOUS) ×3
SPONGE GAUZE 4X4 12PLY STER LF (GAUZE/BANDAGES/DRESSINGS) ×3 IMPLANT
STRIP CLOSURE SKIN 1/2X4 (GAUZE/BANDAGES/DRESSINGS) ×2 IMPLANT
SUT VIC AB 0 CTX 36 (SUTURE) ×10
SUT VIC AB 0 CTX36XBRD ANBCTRL (SUTURE) ×5 IMPLANT
SUT VIC AB 4-0 KS 27 (SUTURE) ×3 IMPLANT
SYR 30ML LL (SYRINGE) ×3 IMPLANT
TAPE CLOTH SURG 4X10 WHT LF (GAUZE/BANDAGES/DRESSINGS) ×3 IMPLANT
TOWEL OR 17X24 6PK STRL BLUE (TOWEL DISPOSABLE) ×3 IMPLANT
TRAY FOLEY CATH 14FR (SET/KITS/TRAYS/PACK) ×3 IMPLANT
WATER STERILE IRR 1000ML POUR (IV SOLUTION) IMPLANT

## 2013-10-01 NOTE — Consult Note (Signed)
28 y.o. G1P0 at [redacted]w[redacted]d presenting for elective c/s @37wks  due to hx of myomectomy with unknown approach done in [place].  Pregnancy has been complicated by thrombocytopenia of pregnancy (most recently 130 at 28 weeks) and she was admitted once for vaginal bleeding for which she received BMZ.  B+, other labs normal.  GBS unknown but c-section with ROM, so treatment would not be indicated.     Infant was vigorous at delivery, requiring no resuscitation other than standard warming, drying and stimulation.  APGARs 8 and 9.  Exam within normal limits.  Admit to central nursery for routine newborn care.

## 2013-10-01 NOTE — Lactation Note (Signed)
This note was copied from the chart of Megan Jennye Runquist. Lactation Consultation Note Initial visit at 9 hours of age.  Baby has latched once and had a small bottle of formula.  Mom wants to do both, so we discussed benefits of exclusively breast feeding.  Mom is attempting latch in football hold on left breast with a shallow latch.  Assisted mom with cross cradle hold.  Baby opens wide and gets a deep wide latch with rhythmic suckling.  Encouraged mom to fee with cues on demand.  Feeding frequency discussed.  Hand expression reveal a glistening of colostrum.  Right nipple is inverted, instructed and demonstrated hand pump use, nipple erects some, but still inverts with compression.  Report given to [facility] RN and discussed possible need for nipple shield if baby does not pull nipple out well with latch.  [facility] LC resources given and discussed.  Mom to call for assist as needed.     Patient Name: Megan Coleman Date: 10/01/2013 Reason for consult: Initial assessment   Maternal Data Has patient been taught Hand Expression?: Yes Does the patient have breastfeeding experience prior to this delivery?: No  Feeding Feeding Type: Breast Fed Length of feed:  (10 minutes observed)  LATCH Score/Interventions Latch: Grasps breast easily, tongue down, lips flanged, rhythmical sucking. Intervention(s): Breast compression;Assist with latch;Adjust position  Audible Swallowing: A few with stimulation Intervention(s): Skin to skin;Hand expression Intervention(s): Skin to skin;Hand expression;Alternate breast massage  Type of Nipple: Inverted (right nipple inverts left nipple erect) Intervention(s): Hand pump  Comfort (Breast/Nipple): Soft / non-tender     Hold (Positioning): Assistance needed to correctly position infant at breast and maintain latch.  LATCH Score: 6  Lactation Tools Discussed/Used     Consult Status Consult Status: Follow-up Date: 10/02/13 Follow-up type:  In-patient    Megan Coleman 10/01/2013, 11:01 PM

## 2013-10-01 NOTE — H&P (Signed)
Attestation of Attending Supervision of Obstetric Fellow: Evaluation and management procedures were performed by the Obstetric Fellow under my supervision and collaboration.  I have reviewed the Obstetric Fellow's note and chart, and I agree with the management and plan.  Jacob Stinson, DO Attending Physician Faculty Practice, [facility]  

## 2013-10-01 NOTE — Anesthesia Preprocedure Evaluation (Addendum)
Anesthesia Evaluation  Patient identified by MRN, date of birth, ID band Patient awake    Reviewed: Allergy & Precautions, H&P , NPO status , Patient's Chart, lab work & pertinent test results  History of Anesthesia Complications Negative for: history of anesthetic complications  Airway Mallampati: III TM Distance: >3 FB Neck ROM: full    Dental no notable dental hx. (+) Teeth Intact   Pulmonary neg pulmonary ROS,  breath sounds clear to auscultation  Pulmonary exam normal       Cardiovascular Exercise Tolerance: Good negative cardio ROS  Rhythm:regular Rate:Normal     Neuro/Psych  Headaches, negative psych ROS   GI/Hepatic negative GI ROS, Neg liver ROS,   Endo/Other    Renal/GU negative Renal ROS     Musculoskeletal negative musculoskeletal ROS (+)   Abdominal   Peds  Hematology  (+) anemia ,   Anesthesia Other Findings   Reproductive/Obstetrics (+) Pregnancy                          Anesthesia Physical Anesthesia Plan  ASA: II  Anesthesia Plan: Spinal   Post-op Pain Management:    Induction:   Airway Management Planned:   Additional Equipment:   Intra-op Plan:   Post-operative Plan:   Informed Consent: I have reviewed the patients History and Physical, chart, labs and discussed the procedure including the risks, benefits and alternatives for the proposed anesthesia with the patient or authorized representative who has indicated his/her understanding and acceptance.   Dental Advisory Given  Plan Discussed with: CRNA  Anesthesia Plan Comments: (Lab work confirmed with CRNA in room. Platelets okay. Discussed spinal anesthetic, and patient consents to the procedure:  included risk of possible headache,backache, failed block, allergic reaction, and nerve injury. This patient was asked if she had any questions or concerns before the procedure started. )         Anesthesia Quick Evaluation

## 2013-10-01 NOTE — H&P (Signed)
LABOR ADMISSION HISTORY AND PHYSICAL   Megan Coleman is a 28 y.o. female G32P0 with IUP at [redacted]w[redacted]d by LMP c/w 21 week [place] presenting for elective c/s @37wks  due to hx of myomectomy with unknown approach done in [place] [facility]. She is s/p a left lower quadrant endometrioma removal and subsequent mesh placement in the LLQ 07/2011.    Her pregnancy care is uncomplicated. She was admitted once for vaginal bleeding and s/p BMZ subsequently. No difficulties since. Normal quad, normal [place] and normal 1 hour glucola. She has had thrombocytopenia of pregnancy (most recently 130 at 28 weeks)  [facility] 18 wks.   Prenatal History/Complications:  Past Medical History: Past Medical History  Diagnosis Date  . Abscess     lower left abdomen/groin area  . Abdominal pain   . Generalized headaches   . Abdominal wall mass of left lower quadrant, possible endometrioma 07/31/2011  . Sickle cell anemia   . Endometriosis of abdominal wall s/p excision [id number] 07/31/2011  . Sickle cell trait   . Lymph nodes enlarged     Past Surgical History: Past Surgical History  Procedure Laterality Date  . Myomectomy  07/03/2006    In [place]  . Appendectomy  04/02/2009    In [place]  . Ventral hernia repair  08/28/11    removal of endometriomas in lower abdominal wall with mesh reconstruction  . Laparoscopy  08/28/2011    Procedure: LAPAROSCOPY DIAGNOSTIC;  Surgeon: Adin Hector, MD;  Location: [facility];  Service: General;  Laterality: N/A;  Diagnostic Laparoscopy with Removal of Abdominal Wall Mass  Open Ventral Wall Hernia Repair with Mesh   . Mass excision  08/28/2011    Procedure: EXCISION MASS;  Surgeon: Adin Hector, MD;  Location: [facility];  Service: General;  Laterality: N/A;  Removal of Mass Abdominal Wall   . Ventral hernia repair  08/28/2011    Procedure: HERNIA REPAIR VENTRAL ADULT;  Surgeon: Adin Hector, MD;  Location: [facility];  Service: General;  Laterality: N/A;   Open Ventral Wall  Hernia with Mesh     Obstetrical History: OB History   Grav Para Term Preterm Abortions TAB SAB Ect Mult Living   1         0      Social History: History   Social History  . Marital Status: Single    Spouse Name: N/A    Number of Children: N/A  . Years of Education: N/A   Social History Main Topics  . Smoking status: Never Smoker   . Smokeless tobacco: Never Used  . Alcohol Use: No  . Drug Use: No  . Sexual Activity: Yes   Other Topics Concern  . None   Social History Narrative   Originally from [place]    Family History: History reviewed. No pertinent family history.  Allergies: No Known Allergies  Facility-administered medications prior to admission  Medication Dose Route Frequency Provider Last Rate Last Dose  . Tdap (BOOSTRIX) injection 0.5 mL  0.5 mL Intramuscular Once Donnamae Jude, MD       Prescriptions prior to admission  Medication Sig Dispense Refill  . butalbital-acetaminophen-caffeine (FIORICET) 50-325-40 MG per tablet Take 1-2 tablets by mouth every 6 (six) hours as needed for headache.  20 tablet  0  . ferrous gluconate (FERGON) 324 MG tablet Take 1 tablet (324 mg total) by mouth daily with breakfast.  90 tablet  3  . Prenatal Vit-Fe Fumarate-FA (  PRENATAL MULTIVITAMIN) TABS tablet Take 1 tablet by mouth daily at 12 noon.  90 tablet  3     Review of Systems  All systems reviewed and negative except as stated in HPI    Blood pressure 119/78, pulse 81, temperature 98.6 F (37 C), temperature source Oral, resp. rate 18, height 5\' 6"  (1.676 m), last menstrual period 01/15/2013, SpO2 100.00%. General appearance: alert, cooperative and no distress Lungs: clear to auscultation bilaterally Heart: regular rate and rhythm Abdomen: soft, non-tender; bowel sounds normal  Extremities: Homans sign is negative, no sign of DVT  Presentation: cephalic      Prenatal labs: ABO, Rh: --/--/B POS (01/27 0855) Antibody: NEG (01/27  0855) Rubella:   RPR: NON REAC (02/12 1228)  HBsAg: NEGATIVE (12/02 1519)  HIV: NON REACTIVE (02/12 1228)  GBS:    1 hr Glucola 111 Genetic screening  normal Anatomy US normal    Assessment: Megan Coleman is a 28 y.o. G1P0 with an IUP at [redacted]w[redacted]d presenting for elective primary LTCS for hx of myomectomy and endometrioma excision with mesh placement.    Plan: The risks of cesarean section discussed with the patient included but were not limited to: bleeding which may require transfusion or reoperation; infection which may require antibiotics; injury to bowel, bladder, ureters or other surrounding organs; injury to the fetus; need for additional procedures including hysterectomy in the event of a life-threatening hemorrhage; placental abnormalities wth subsequent pregnancies, incisional problems, thromboembolic phenomenon and other postoperative/anesthesia complications.    In addition to the above, we discussed that due to the fact that the patient has mesh and we are not exactly sure of the location, we will attempt to avoid it as much as possible and this may result in an asymmetric skin incision.    The patient concurred with the proposed plan, giving informed written consent for the procedure.   Patient has been NPO since midnight she will remain NPO for procedure. Anesthesia and OR aware. Preoperative prophylactic antibiotics and SCDs ordered on call to the OR.  To OR when ready.     Kassie Mends, MD 10/01/2013, 12:08 PM

## 2013-10-01 NOTE — Op Note (Signed)
Attestation of Attending Supervision of Obstetric Fellow During Surgery: Surgery was performed by the Obstetric Fellow under my supervision and collaboration.  I have reviewed the Obstetric Fellow's operative report, and I agree with the documentation.  I was present and scrubbed for the entire procedure.    Loma Boston, DO Attending Physician Faculty Practice, [place]

## 2013-10-01 NOTE — Anesthesia Procedure Notes (Addendum)
Spinal  Patient location during procedure: OR Preanesthetic Checklist Completed: patient identified, site marked, surgical consent, pre-op evaluation, timeout performed, IV checked, risks and benefits discussed and monitors and equipment checked Spinal Block Patient position: sitting Prep: DuraPrep Patient monitoring: heart rate, cardiac monitor, continuous pulse ox and blood pressure Approach: midline Location: L3-4 Injection technique: single-shot Needle Needle type: Sprotte  Needle gauge: 24 G Needle length: 9 cm Assessment Sensory level: T4 Additional Notes Spinal difficult because pt is apprehensive, and everything caused her to physically react, yet she didn't communicate if there was something specificly bothering her, as  to change the procedure. It felt typical and the CSF was clear; I did re-positioned the needle x1 because of non-aspirating CSF with 1st pass Spinal Dosage in OR  Bupivacaine ml       1.6 PFMS04   mcg        150 Fentanyl mcg            25

## 2013-10-01 NOTE — Op Note (Signed)
Kandise S Huizar PROCEDURE DATE: 10/01/2013  PREOPERATIVE DIAGNOSIS: Intrauterine pregnancy at  [redacted]w[redacted]d weeks gestation; hx of prior myomectomy and hx of endometrioma s/p removal and mesh placement  POSTOPERATIVE DIAGNOSIS: The same  PROCEDURE: Primary Low Transverse Cesarean Section  SURGEON:  Dr. Loma Boston  ASSISTANT:  Dr. Ebbie Latus   INDICATIONS: Megan Coleman is a 28 y.o. G1P1001 at [redacted]w[redacted]d here for cesarean section secondary to the indications listed under preoperative diagnosis; please see preoperative note for further details.  The risks of cesarean section were discussed with the patient including but were not limited to: bleeding which may require transfusion or reoperation; infection which may require antibiotics; injury to bowel, bladder, ureters or other surrounding organs; injury to the fetus; need for additional procedures including hysterectomy in the event of a life-threatening hemorrhage; placental abnormalities wth subsequent pregnancies, incisional problems, thromboembolic phenomenon and other postoperative/anesthesia complications.   The patient concurred with the proposed plan, giving informed written consent for the procedure.    FINDINGS:  Viable female infant in cephalic presentation.  Apgars 8 and 9.  Clear amniotic fluid.  Intact placenta, three vessel cord.  Normal uterus, fallopian tubes and ovaries bilaterally. Significant scar tissue from the subcutaneous tissue down to the peritoneum worse on the left than the right. PDS or prolene suture noted from previous cases in the midline at the level of the incision.     ANESTHESIA: Spinal INTRAVENOUS FLUIDS: 2000 ml ESTIMATED BLOOD LOSS: 600 ml URINE OUTPUT:  200 ml SPECIMENS: Placenta sent to L&D COMPLICATIONS: None immediate  PROCEDURE IN DETAIL:  The patient preoperatively received intravenous antibiotics and had sequential compression devices applied to her lower extremities.  She was then taken to the  operating room where spinal anesthesia was administered and was found to be adequate. She was then placed in a dorsal supine position with a leftward tilt, and prepped and draped in a sterile manner.  A foley catheter was placed into her bladder and attached to constant gravity.  After an adequate timeout was performed, a Pfannenstiel skin incision was made 2 fingerbreadth's above the pubic symphysis with scalpel and carried through to the underlying layer of fascia. Significant scar tissue was noted around the fascial layer and several what appeared to be prolene or PDS suture ties were noted.  These were transected with the scalpel without difficulty. The fascia was incised in the midline, and this incision was extended bilaterally using the Mayo scissors.  Kocher clamps were applied to the superior aspect of the fascial incision and the underlying rectus muscles were dissected off bluntly and using electrocautery.  Due to the scarring, the medial 1/3 of the rectus muscles bilaterally were incised using electrocautery to ensure adequate room.   The peritoneum was entered bluntly. Attention was turned to the lower uterine segment where a low transverse hysterotomy incision was made with a scalpel and extended bilaterally bluntly.  The infant was successfully delivered, the cord was clamped and cut and the infant was handed over to awaiting neonatology team. Uterine massage was then administered, and the placenta delivered intact with a three-vessel cord. The uterus was then cleared of clot and debris.  The hysterotomy was closed with 0 Vicryl in a running locked fashion, and an imbricating layer was also placed with 0 vicryl.  A single figure of 8 was used to ensure hemostasis.  Intercede was placed to attempt to help with future scarring.  The pelvis was cleared of all clot and debris. Hemostasis was confirmed  on all surfaces.  The fascia was then closed using 0 Vicryl in a running fashion.  The subcutaneous layer  was irrigated, and 30cc of 0.5% marcaine was injected into the incision.  The skin was closed with a 4-0 Vicryl subcuticular stitch and benzoin and steristrips were applied.  The patient tolerated the procedure well. Sponge, lap, instrument and needle counts were correct x 2.  She was taken to the recovery room in stable condition.

## 2013-10-01 NOTE — Transfer of Care (Signed)
Immediate Anesthesia Transfer of Care Note  Patient: Megan Coleman  Procedure(s) Performed: Procedure(s): CESAREAN SECTION (N/A)  Patient Location: PACU  Anesthesia Type:Spinal  Level of Consciousness: awake, alert  and oriented  Airway & Oxygen Therapy: Patient Spontanous Breathing  Post-op Assessment: Report given to PACU RN  Post vital signs: Reviewed and stable  Complications: No apparent anesthesia complications

## 2013-10-01 NOTE — Anesthesia Postprocedure Evaluation (Signed)
  Anesthesia Post-op Note  Patient: Megan Coleman  Procedure(s) Performed: Procedure(s): CESAREAN SECTION (N/A) Last Vitals:  Filed Vitals:   10/01/13 1826  BP: 118/76  Pulse: 71  Temp: 36.8 C  Resp: 18   Patient is awake and responsive. Pain and nausea are reasonably well controlled. Vital signs are stable and clinically acceptable. Oxygen saturation is clinically acceptable. There are no apparent anesthetic complications at this time. Patient is ready for discharge.

## 2013-10-02 LAB — CBC
HEMATOCRIT: 24.8 % — AB (ref 36.0–46.0)
Hemoglobin: 8.2 g/dL — ABNORMAL LOW (ref 12.0–15.0)
MCH: 27.9 pg (ref 26.0–34.0)
MCHC: 33.5 g/dL (ref 30.0–36.0)
MCV: 83.2 fL (ref 78.0–100.0)
Platelets: 106 10*3/uL — ABNORMAL LOW (ref 150–400)
RBC: 2.98 MIL/uL — AB (ref 3.87–5.11)
RDW: 14.9 % (ref 11.5–15.5)
WBC: 5 10*3/uL (ref 4.0–10.5)

## 2013-10-02 LAB — BIRTH TISSUE RECOVERY COLLECTION (PLACENTA DONATION)

## 2013-10-02 NOTE — Lactation Note (Signed)
This note was copied from the chart of Megan Coleman. Lactation Consultation Note  Patient Name: Megan Coleman [id number] Date: 10/02/2013 Reason for consult: Follow-up assessment;Difficult latch (baby not latching to inverted (R) breast).  [facility] spoke with this dyad's RN, Beth prior to visit.  Beth reports initiating a DEBP for the (R) breast and that she had provided assistance and instruction to mom to pump on this side every 3 hours and continue nursing ad lib on (L).  Mom did not obtain any em at this time but baby just 31 hours of age and LC reassured mother that obtaining milk from the pump is difficult in first few days. LC provided dish soap for mom to wash pump parts and reviewed milk storage guidelines for ebm, based on Baby and Me, pg 25.  Mom's sister at bedside.  Mom has also continued giving some formula by bottle, per her initial plan on admission.   Maternal Data    Feeding Feeding Type: Breast Fed Length of feed: 45 min  LATCH Score/Interventions Latch: Repeated attempts needed to sustain latch, nipple held in mouth throughout feeding, stimulation needed to elicit sucking reflex. Intervention(s): Adjust position;Assist with latch;Breast massage;Breast compression (unable to latch rt nipple flat)  Audible Swallowing: None Intervention(s): Skin to skin;Hand expression Intervention(s): Skin to skin;Hand expression;Alternate breast massage  Type of Nipple: Flat Intervention(s): Hand pump;Double electric pump (will start elec pump) Intervention(s): Double electric pump (will set up elec pump)  Comfort (Breast/Nipple): Soft / non-tender     Hold (Positioning): Assistance needed to correctly position infant at breast and maintain latch. Intervention(s): Breastfeeding basics reviewed;Support Pillows;Position options;Skin to skin  LATCH Score: 5  Lactation Tools Discussed/Used Tools: Pump Pump Review: Milk Storage;Setup, frequency, and cleaning Initiated  by:: RN, Beth initiated and LC reinforced Date initiated:: 10/02/13   Consult Status Consult Status: Follow-up Date: 10/03/13 Follow-up type: In-patient    Landis Gandy 10/02/2013, 8:43 PM

## 2013-10-02 NOTE — Addendum Note (Signed)
Addendum created 10/02/13 0846 by Flossie Dibble, CRNA   Modules edited: Notes Section   Notes Section:  File: [id number]

## 2013-10-02 NOTE — Anesthesia Postprocedure Evaluation (Signed)
Anesthesia Post Note  Patient: Megan Coleman  Procedure(s) Performed: Procedure(s) (LRB): CESAREAN SECTION (N/A)  Anesthesia type: Spinal  Patient location: Mother/Baby  Post pain: Pain level controlled  Post assessment: Post-op Vital signs reviewed  Last Vitals:  Filed Vitals:   10/02/13 0528  BP: 99/60  Pulse: 64  Temp: 36.9 C  Resp: 18    Post vital signs: Reviewed  Level of consciousness: awake  Complications: No apparent anesthesia complications

## 2013-10-02 NOTE — Progress Notes (Signed)
Subjective: Postpartum Day 1: Cesarean Delivery Patient reports incisional pain, tolerating PO and no problems voiding.  No flatus yet.   Objective: Vital signs in last 24 hours: Temp:  [98 F (36.7 C)-99.2 F (37.3 C)] 98.4 F (36.9 C) (04/11 0528) Pulse Rate:  [58-98] 64 (04/11 0528) Resp:  [11-24] 18 (04/11 0528) BP: (95-124)/(59-85) 99/60 mmHg (04/11 0528) SpO2:  [95 %-100 %] 99 % (04/11 0528) Weight:  [78.472 kg (173 lb)] 78.472 kg (173 lb) (04/10 1610)  Physical Exam:  General: alert, cooperative and no distress Lochia: appropriate Uterine Fundus: firm Incision:  honey comb in place with pressure dressing but no bleed through.   DVT Evaluation: No cords or calf tenderness. No significant calf/ankle edema.   Recent Labs  10/01/13 1145 10/02/13 0631  HGB 10.7* 8.2*  HCT 32.1* 24.8*    Assessment/Plan: Status post Cesarean section. Doing well postoperatively.  Continue current care. Ambulate in hall today [place] 10/02/2013, 8:56 AM

## 2013-10-03 MED ORDER — IBUPROFEN 600 MG PO TABS: 600.0000 mg | ORAL_TABLET | Freq: Four times a day (QID) | ORAL | Status: DC

## 2013-10-03 MED ORDER — OXYCODONE-ACETAMINOPHEN 5-325 MG PO TABS: 1.0000 | ORAL_TABLET | ORAL | Status: DC | PRN

## 2013-10-03 NOTE — Discharge Summary (Signed)
Attestation of Attending Supervision of Advanced Practitioner (PA/CNM/NP): Evaluation and management procedures were performed by the Advanced Practitioner under my supervision and collaboration.  I have reviewed the Advanced Practitioner's note and chart, and I agree with the management and plan.  Ipek Westra, MD, FACOG Attending Obstetrician & Gynecologist Faculty Practice, [facility]  

## 2013-10-03 NOTE — Discharge Summary (Signed)
Obstetric Discharge Summary Reason for Admission: cesarean section- elective primary due to hx myomectomy with unknown approach and endometrioma with removal and mesh placement Prenatal Procedures: none Intrapartum Procedures: cesarean: low cervical, transverse Postpartum Procedures: none Complications-Operative and Postpartum: none Hemoglobin  Date Value Ref Range Status  10/02/2013 8.2* 12.0 - 15.0 g/dL Final     REPEATED TO VERIFY     DELTA CHECK NOTED     HCT  Date Value Ref Range Status  10/02/2013 24.8* 36.0 - 46.0 % Final   Megan Coleman is a 28yo G1 who presented at 37.0wks for a scheduled C/S due to prev myomectomy with unknown approach. She tolerated the procedure well and by POD#2 she is deemed to have received the full benefit of her hospital stay. She is breastfeeding and will probably want OCPs for contraception (will get rx at [facility] visit). Preop hgb 10.7 with POD#1 8.2.  Physical Exam:  General: alert, cooperative and no distress Heart: RRR Lungs: nl effort Lochia: appropriate Uterine Fundus: firm Incision: honeycomb with staining DVT Evaluation: No evidence of DVT seen on physical exam.  Discharge Diagnoses: Term Pregnancy-delivered  Discharge Information: Date: 10/03/2013 Activity: pelvic rest Diet: routine Medications: PNV, Ibuprofen and Percocet Condition: stable Instructions: refer to practice specific booklet Discharge to: home Follow-up Information   Follow up with [facility]. Schedule an appointment as soon as possible for a visit in 4 weeks. (For your postpartum appointment.)    Contact information:   [place] [address] [phone]      Newborn Data: Live born female  Birth Weight: 7 lb 0.4 oz (3185 g) APGAR: 8, 9  Home with mother.  Myrtis Ser 10/03/2013, 7:37 AM

## 2013-10-03 NOTE — Discharge Instructions (Signed)
Cesarean Delivery Care After Refer to this sheet in the next few weeks. These instructions provide you with information on caring for yourself after your procedure. Your health care provider may also give you specific instructions. Your treatment has been planned according to current medical practices, but problems sometimes occur. Call your health care provider if you have any problems or questions after you go home. HOME CARE INSTRUCTIONS  Only take over-the-counter or prescription medications as directed by your health care provider.  Do not drink alcohol, especially if you are breastfeeding or taking medication to relieve pain.  Do not chew or smoke tobacco.  Continue to use good perineal care. Good perineal care includes:  Wiping your perineum from front to back.  Keeping your perineum clean.  Check your surgical cut (incision) daily for increased redness, drainage, swelling, or separation of skin.  Clean your incision gently with soap and water every day, and then pat it dry. If your health care provider says it is OK, leave the incision uncovered. Use a bandage (dressing) if the incision is draining fluid or appears irritated. If the adhesive strips across the incision do not fall off within 7 days, carefully peel them off.  Hug a pillow when coughing or sneezing until your incision is healed. This helps to relieve pain.  Do not use tampons or douche until your health care provider says it is okay.  Shower, wash your hair, and take tub baths as directed by your health care provider.  Wear a well-fitting bra that provides breast support.  Limit wearing support panties or control-top hose.  Drink enough fluids to keep your urine clear or pale yellow.  Eat high-fiber foods such as whole grain cereals and breads, brown rice, beans, and fresh fruits and vegetables every day. These foods may help prevent or relieve constipation.  Resume activities such as climbing stairs,  driving, lifting, exercising, or traveling as directed by your health care provider.  Talk to your health care provider about resuming sexual activities. This is dependent upon your risk of infection, your rate of healing, and your comfort and desire to resume sexual activity.  Try to have someone help you with your household activities and your newborn for at least a few days after you leave the hospital.  Rest as much as possible. Try to rest or take a nap when your newborn is sleeping.  Increase your activities gradually.  Keep all of your scheduled postpartum appointments. It is very important to keep your scheduled follow-up appointments. At these appointments, your health care provider will be checking to make sure that you are healing physically and emotionally. SEEK MEDICAL CARE IF:   You are passing large clots from your vagina. Save any clots to show your health care provider.  You have a foul smelling discharge from your vagina.  You have trouble urinating.  You are urinating frequently.  You have pain when you urinate.  You have a change in your bowel movements.  You have increasing redness, pain, or swelling near your incision.  You have pus draining from your incision.  Your incision is separating.  You have painful, hard, or reddened breasts.  You have a severe headache.  You have blurred vision or see spots.  You feel sad or depressed.  You have thoughts of hurting yourself or your newborn.  You have questions about your care, the care of your newborn, or medications.  You are dizzy or lightheaded.  You have a rash.  You  have pain, redness, or swelling at the site of the removed intravenous access (IV) tube. °· You have nausea or vomiting. °· You stopped breastfeeding and have not had a menstrual period within 12 weeks of stopping. °· You are not breastfeeding and have not had a menstrual period within 12 weeks of delivery. °· You have a fever. °SEEK  IMMEDIATE MEDICAL CARE IF: °· You have persistent pain. °· You have chest pain. °· You have shortness of breath. °· You faint. °· You have leg pain. °· You have stomach pain. °· Your vaginal bleeding saturates 2 or more sanitary pads in 1 hour. °MAKE SURE YOU:  °· Understand these instructions. °· Will watch your condition. °· Will get help right away if you are not doing well or get worse. °Document Released: 03/02/2002 Document Revised: 02/10/2013 Document Reviewed: 02/05/2012 °Exit[employer]® Patient Information ©2014 [employer]. ° ° ° °

## 2013-10-04 ENCOUNTER — Encounter (HOSPITAL_COMMUNITY): Payer: Self-pay | Admitting: Family Medicine

## 2013-10-04 ENCOUNTER — Encounter: Payer: Medicaid Other | Admitting: Family Medicine

## 2013-10-04 NOTE — Progress Notes (Signed)
Ur chart review completed post discharge.  

## 2013-10-13 ENCOUNTER — Encounter: Payer: Self-pay | Admitting: *Deleted

## 2013-10-29 ENCOUNTER — Encounter: Payer: Self-pay | Admitting: Medical

## 2013-10-29 ENCOUNTER — Ambulatory Visit (INDEPENDENT_AMBULATORY_CARE_PROVIDER_SITE_OTHER): Payer: Medicaid Other | Admitting: Medical

## 2013-10-29 VITALS — BP 108/66 | HR 74 | Temp 98.2°F | Ht 66.0 in | Wt 157.0 lb

## 2013-10-29 DIAGNOSIS — Z3009 Encounter for other general counseling and advice on contraception: Secondary | ICD-10-CM

## 2013-10-29 DIAGNOSIS — Z98891 History of uterine scar from previous surgery: Secondary | ICD-10-CM

## 2013-10-29 DIAGNOSIS — D696 Thrombocytopenia, unspecified: Secondary | ICD-10-CM | POA: Insufficient documentation

## 2013-10-29 LAB — CBC
HCT: 32.5 % — ABNORMAL LOW (ref 36.0–46.0)
Hemoglobin: 10.7 g/dL — ABNORMAL LOW (ref 12.0–15.0)
MCH: 26.6 pg (ref 26.0–34.0)
MCHC: 32.9 g/dL (ref 30.0–36.0)
MCV: 80.6 fL (ref 78.0–100.0)
Platelets: 209 10*3/uL (ref 150–400)
RBC: 4.03 MIL/uL (ref 3.87–5.11)
RDW: 14.8 % (ref 11.5–15.5)
WBC: 3.3 10*3/uL — ABNORMAL LOW (ref 4.0–10.5)

## 2013-10-29 MED ORDER — NORETHINDRONE 0.35 MG PO TABS: 1.0000 | ORAL_TABLET | Freq: Every day | ORAL | Status: DC

## 2013-10-29 NOTE — Progress Notes (Signed)
Patient ID: Megan Coleman, female   DOB: April 10, 1986, 28 y.o.   MRN: [id number] Subjective:     Megan Coleman is a 28 y.o. female who presents for a postpartum visit. She is 4 weeks postpartum following a low cervical transverse Cesarean section. I have fully reviewed the prenatal and intrapartum course. The delivery was at 37.0 gestational weeks. Outcome: primary cesarean section, low transverse incision. Anesthesia: spinal. Postpartum course has been normal. Baby's course has been normal. Baby is feeding by both breast and bottle - Gerber Gentle. Bleeding staining only. Bowel function is normal. Bladder function is normal. Patient is not sexually active. Contraception method is none. Postpartum depression screening: negative.  The following portions of the patient's history were reviewed and updated as appropriate: allergies, current medications, past family history, past medical history, past social history, past surgical history and problem list.  Review of Systems Pertinent items are noted in HPI.   Objective:    BP 108/66  Pulse 74  Temp(Src) 98.2 F (36.8 C) (Oral)  Ht 5\' 6"  (1.676 m)  Wt 157 lb (71.215 kg)  BMI 25.35 kg/m2  Breastfeeding? Yes  General:  alert and cooperative   Breasts:  not performed  Lungs: normal effort  Heart:  normal rate  Abdomen: soft, non-tender; bowel sounds normal; no masses,  no organomegaly well-healed surgical incision   Vulva:  not evaluated  Vagina: not evaluated  Cervix:  not evaluated  Corpus: not examined  Adnexa:  not evaluated  Rectal Exam: Not performed.        Assessment:     Normal postpartum exam. Pap smear not done at today's visit.  Last pap smear was 05/2013 and normal.   Plan:    1. Contraception: oral progesterone-only contraceptive. Discussed ability to change type of OCPs when no longer breast feeding if desired. Rx for Micronor sent to patient's pharmacy wit refills x 11 2. Patient will be due for annual exam  05/2014 3.CBC drawn today as patient had thrombocytopenia in pregnancy  Follow up in: December, 2015 for annual exam or sooner as needed.

## 2013-11-02 ENCOUNTER — Telehealth: Payer: Self-pay | Admitting: *Deleted

## 2013-11-02 NOTE — Telephone Encounter (Signed)
Message copied by Sue Lush on Tue Nov 02, 2013  8:59 AM ------      Message from: Farris Has      Created: Mon Nov 01, 2013  9:07 PM       Please call and inform patient that low platelets seem to have resolved after her pregnancy. She was concerned and I told her we would let her know. Thank you! ------

## 2013-11-02 NOTE — Telephone Encounter (Signed)
Spoke with patient concerning results, pt verbalizes understanding, has no further questions.

## 2013-11-10 ENCOUNTER — Encounter: Payer: Self-pay | Admitting: Medical

## 2013-11-18 ENCOUNTER — Encounter: Payer: Self-pay | Admitting: *Deleted

## 2014-04-25 ENCOUNTER — Encounter: Payer: Self-pay | Admitting: Medical

## 2015-01-28 IMAGING — US US OB FOLLOW-UP
1 of 2 series · 12 of 28 positions shown · non-contrast
Comparison: none

[Series 1: us ob follow up · 12 of 40 slices shown]
[im 1/40]
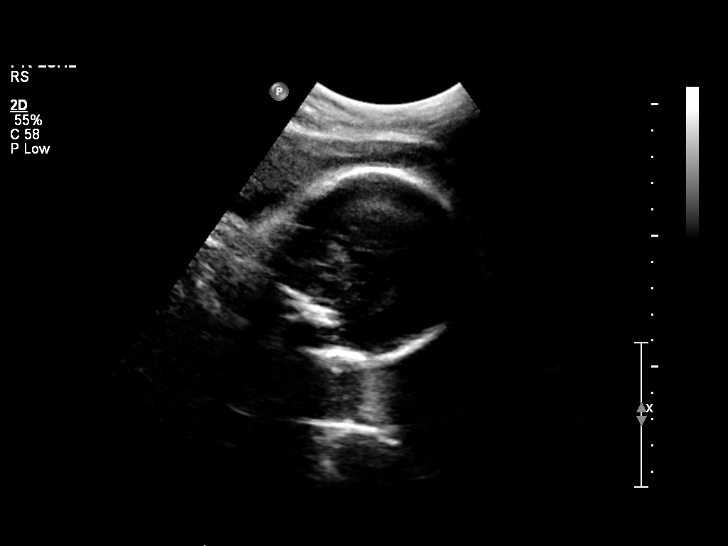
[im 4/40]
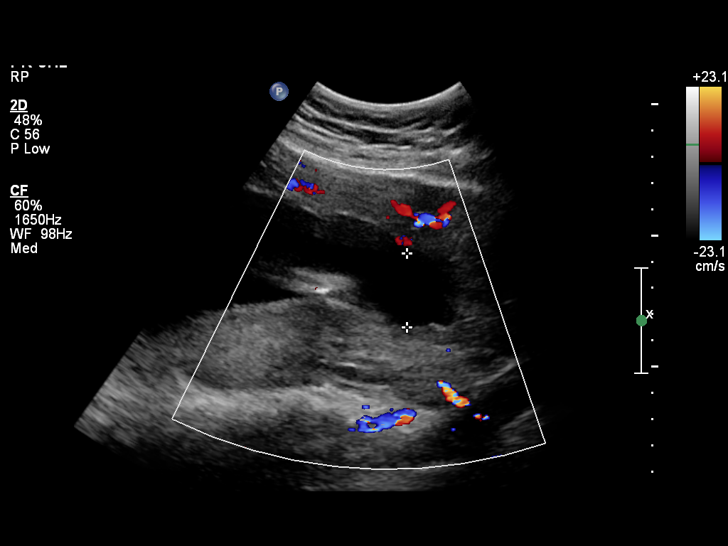
[im 7/40]
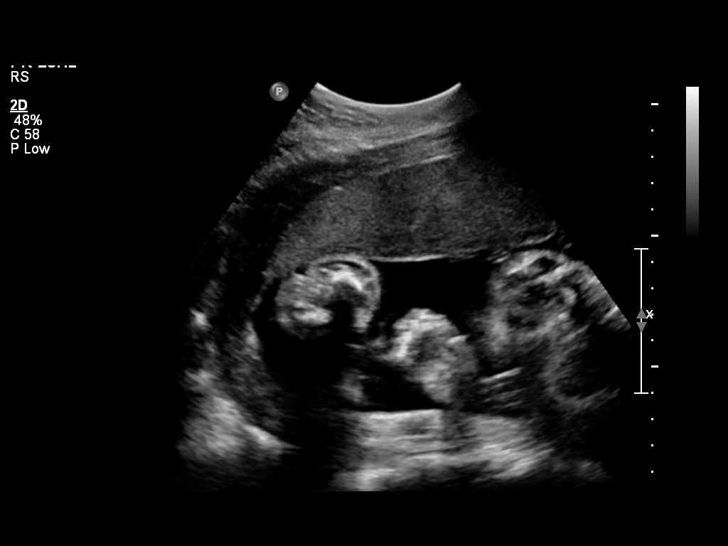
[im 11/40]
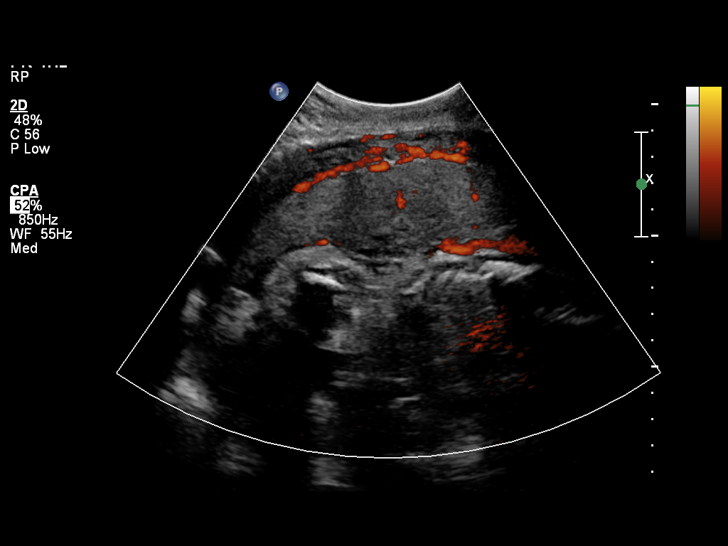
[im 14/40]
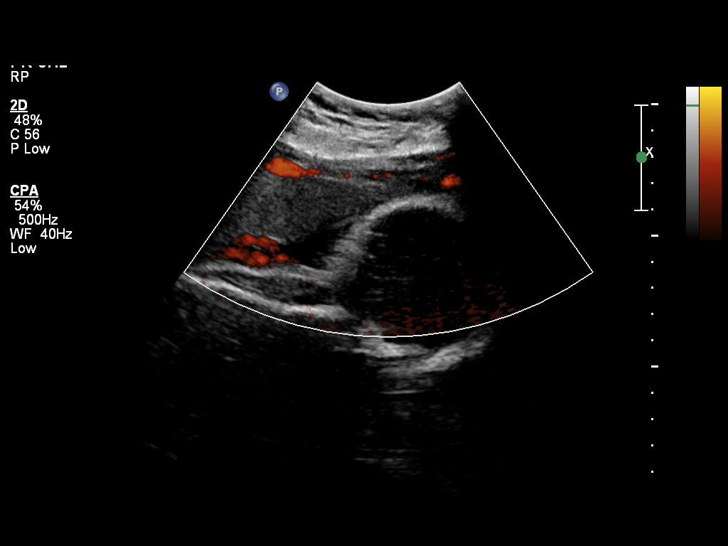
[im 17/40]
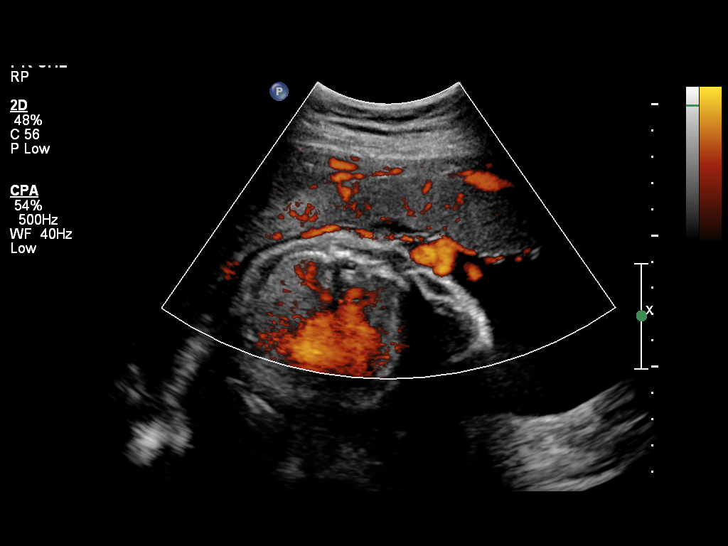
[im 22/40]
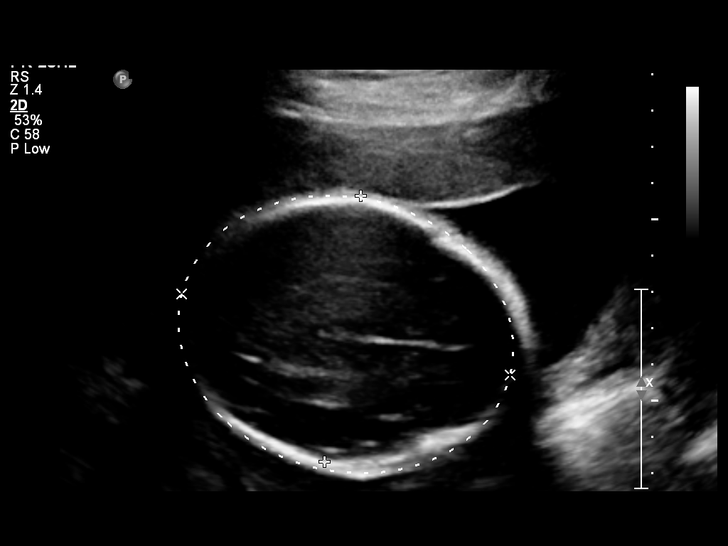
[im 25/40]
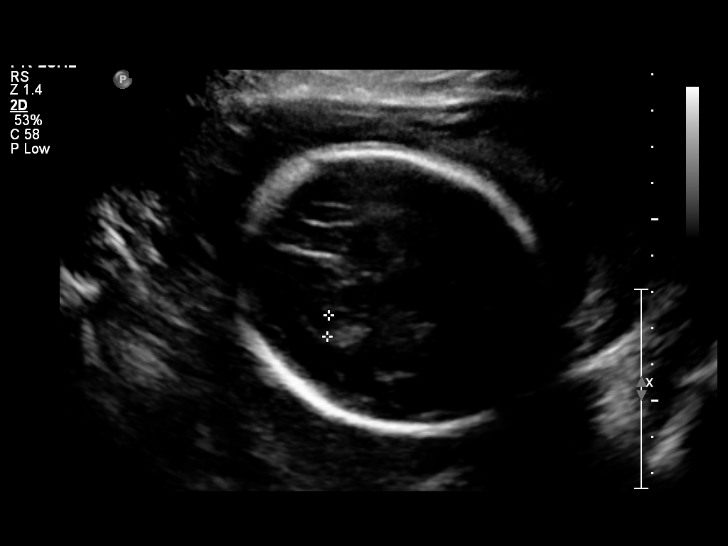
[im 28/40]
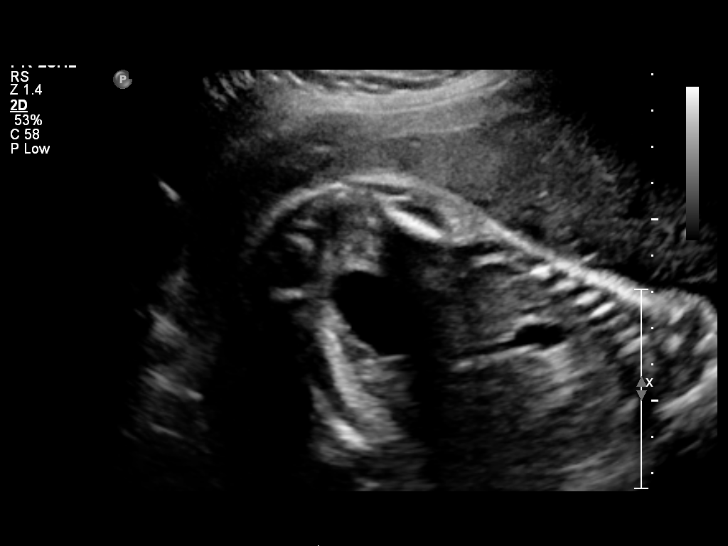
[im 32/40]
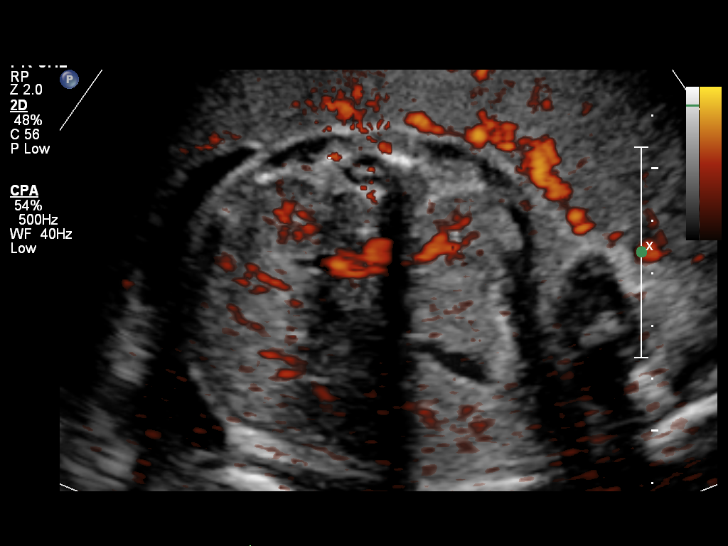
[im 35/40]
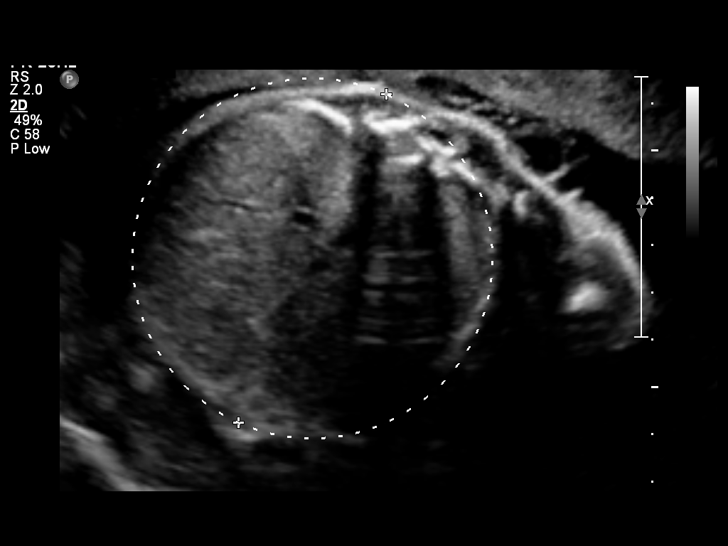
[im 38/40]
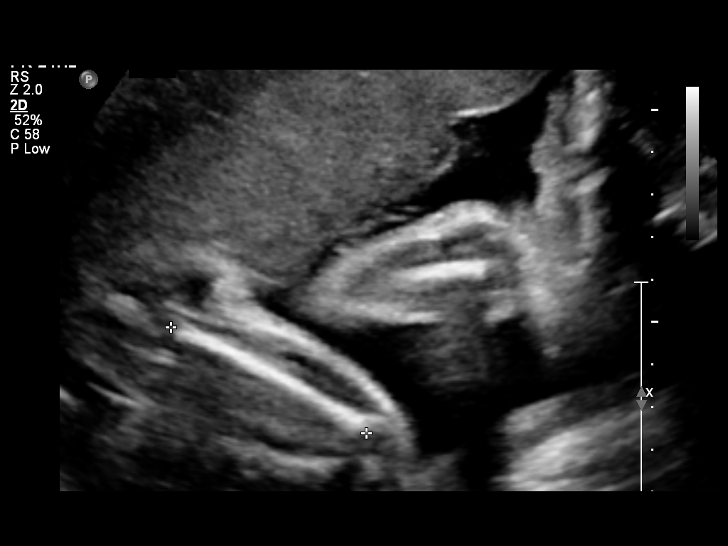

[12 of 28 positions shown; findings below may reference images not displayed]

OBSTETRICS REPORT
                      (Signed Final 07/15/2013 [DATE])

                                                         CNM
Service(s) Provided

 US OB FOLLOW UP                                       76816.1
Indications

 Vaginal bleeding, unknown etiology
 Pain - Abdominal/Pelvic
Fetal Evaluation

 Num Of Fetuses:    1
 Fetal Heart Rate:  135                          bpm
 Cardiac Activity:  Observed
 Presentation:      Cephalic
 Placenta:          Anterior, above cervical os
 P. Cord            Visualized, central
 Insertion:

 Amniotic Fluid
 AFI FV:      Subjectively within normal limits
 AFI Sum:     15.11   cm       53  %Tile     Larg Pckt:    5.32  cm
 RUQ:   3.41    cm   RLQ:    3.57   cm    LUQ:   5.32    cm   LLQ:    2.81   cm
Biometry

 BPD:     73.1  mm     G. Age:  29w 2d                CI:        75.39   70 - 86
                                                      FL/HC:      19.4   18.8 -

 HC:       267  mm     G. Age:  29w 1d       64  %    HC/AC:      1.09   1.05 -

 AC:     245.2  mm     G. Age:  28w 5d       74  %    FL/BPD:     70.9   71 - 87
 FL:      51.8  mm     G. Age:  27w 4d       34  %    FL/AC:      21.1   20 - 24

 Est. FW:    7818  gm    2 lb 11 oz      67  %
Gestational Age

 LMP:           25w 6d        Date:  01/15/13                 EDD:   10/22/13
 U/S Today:     28w 5d                                        EDD:   10/02/13
 Best:          27w 5d     Det. By:  U/S (06/02/13)           EDD:   10/09/13
Anatomy

 Cranium:          Appears normal         Aortic Arch:      Previously seen
 Fetal Cavum:      Appears normal         Ductal Arch:      Previously seen
 Ventricles:       Appears normal         Diaphragm:        Not well visualized
 Choroid Plexus:   Previously seen        Stomach:          Appears normal
 Cerebellum:       Previously seen        Abdomen:          Appears normal
 Posterior Fossa:  Previously seen        Abdominal Wall:   Previously seen
 Nuchal Fold:      Not applicable (>20    Cord Vessels:     Previously seen
                   wks GA)
 Face:             Orbits and profile     Kidneys:          Appear normal
                   previously seen
 Lips:             Previously seen        Bladder:          Appears normal
 Heart:            Appears normal         Spine:            Previously seen
                   (4CH, axis, and
                   situs)
 RVOT:             Previously seen        Lower             Previously seen
                                          Extremities:
 LVOT:             Previously seen        Upper             Previously seen
                                          Extremities:

 Other:  Fetus appears to be a female. Heels and 5th digit previously seen.
Cervix Uterus Adnexa

 Cervical Length:    3.1      cm

 Cervix:       Normal appearance by transabdominal scan.
 Left Ovary:    Not visualized.
 Right Ovary:   Within normal limits.
 Adnexa:     No abnormality visualized.
Impression

 Single IUP at 27 [DATE] weeks
 Interval growth is appropriate (67th %tile)
 Normal interval anatomy
 Anterior placenta without previa; no sonographic evidence of
 placental abruption
 Normal amniotic fluid volume
Recommendations

 Follow-up ultrasounds as clinically indicated.

## 2015-01-29 IMAGING — US US FETAL BPP W/O NONSTRESS
1 series · 14 of 27 positions shown · non-contrast
Comparison: none

[Series 1: us fetal bpp w/o nonstress · non-contrast · 27 acquisitions, 14 frames shown]
[im 1/27]
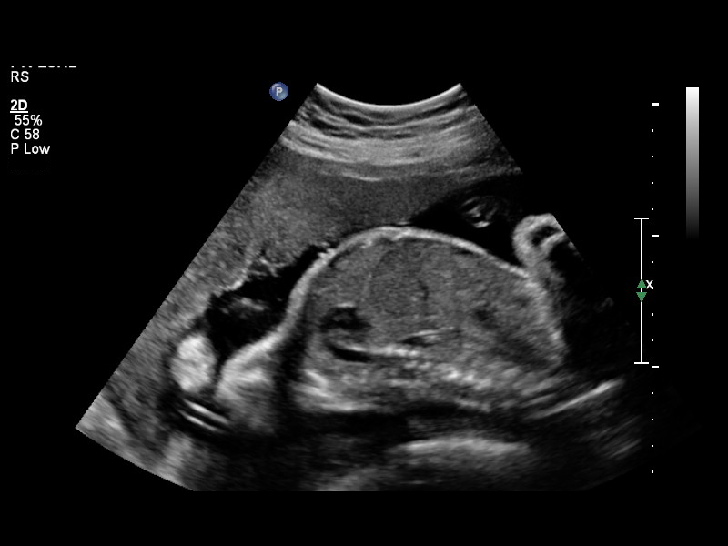
[im 3/27]
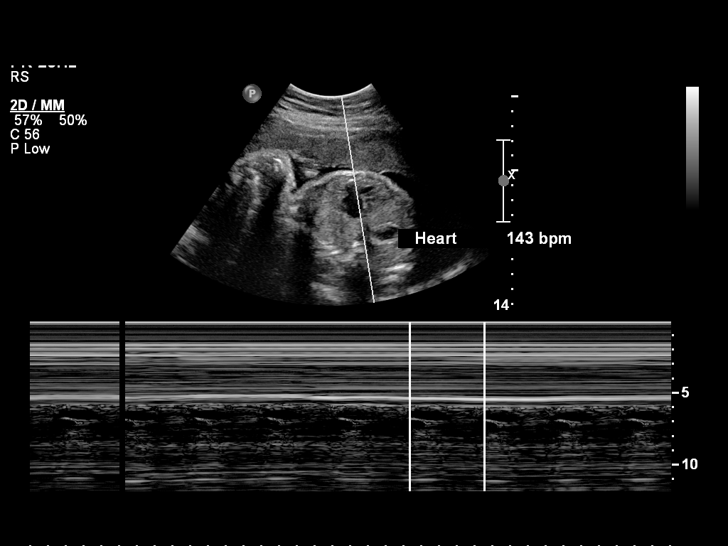
[im 5/27]
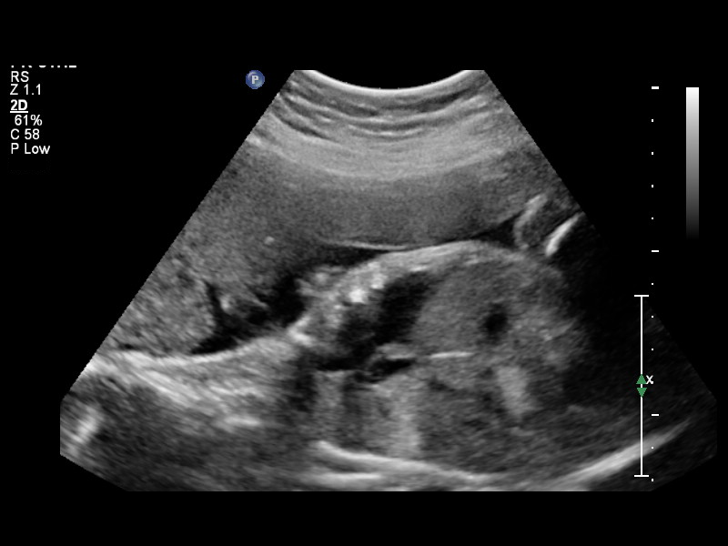
[im 7/27]
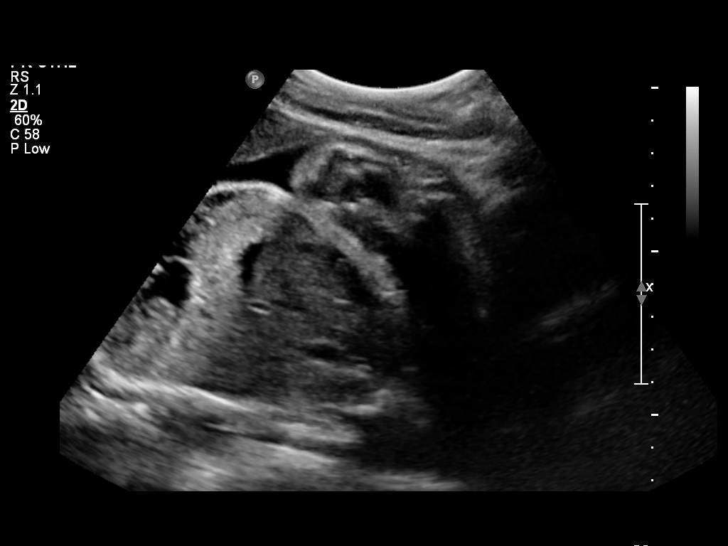
[im 9/27]
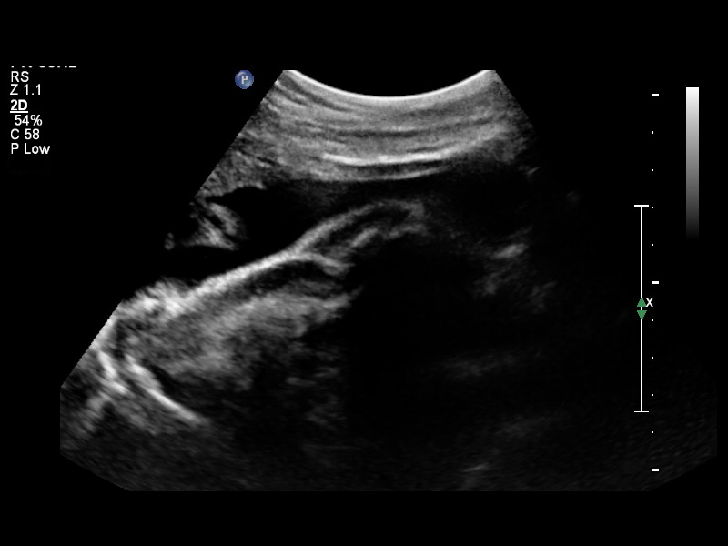
[im 11/27]
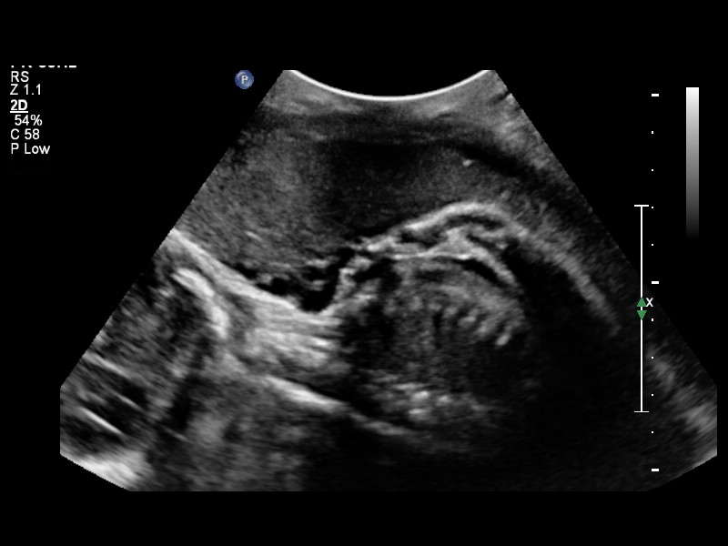
[im 13/27]
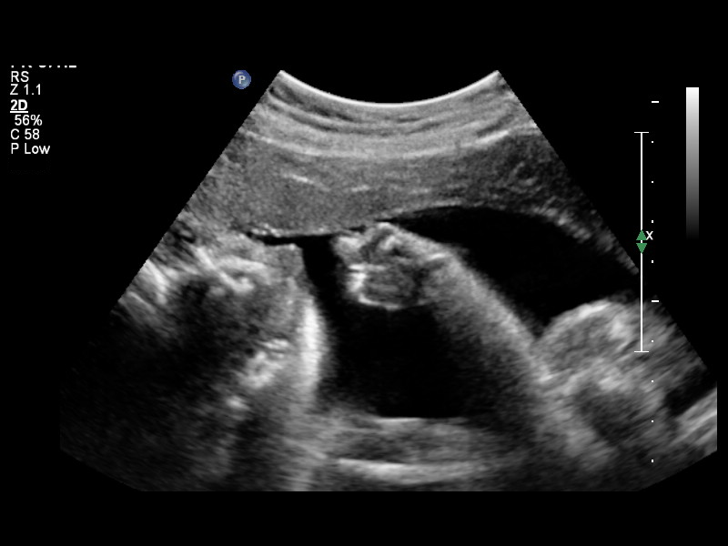
[im 15/27]
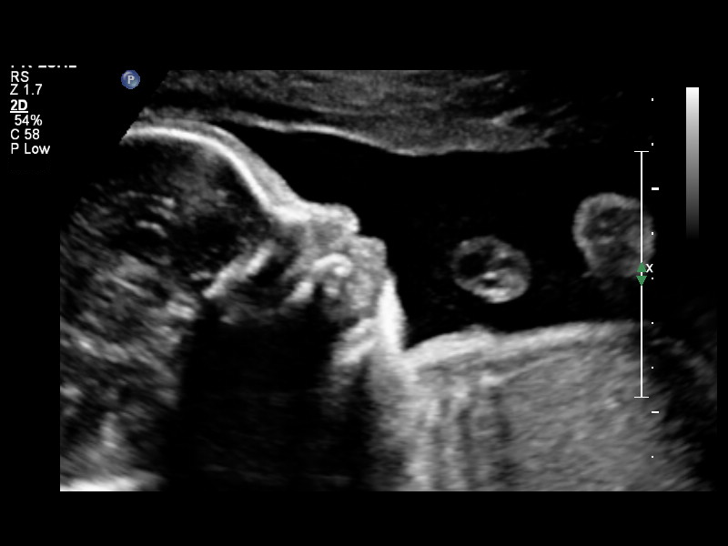
[im 17/27]
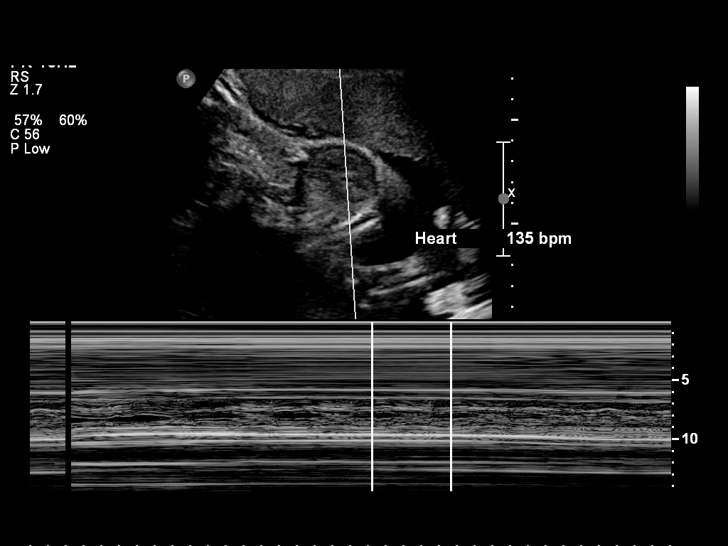
[im 19/27]
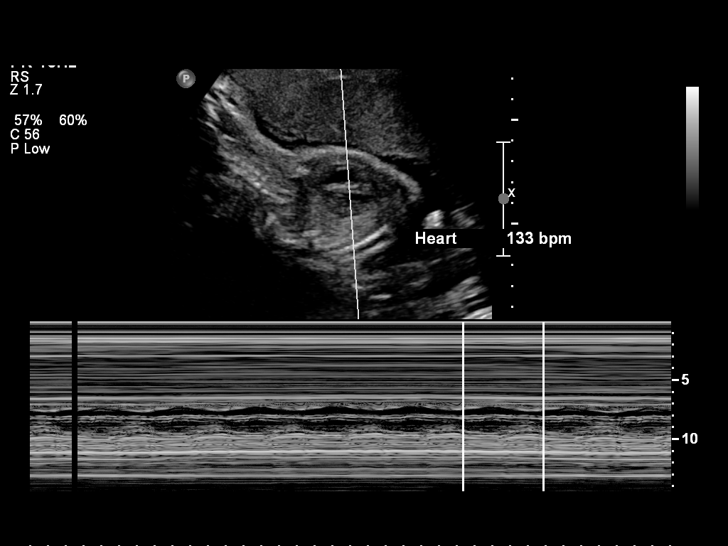
[im 21/27]
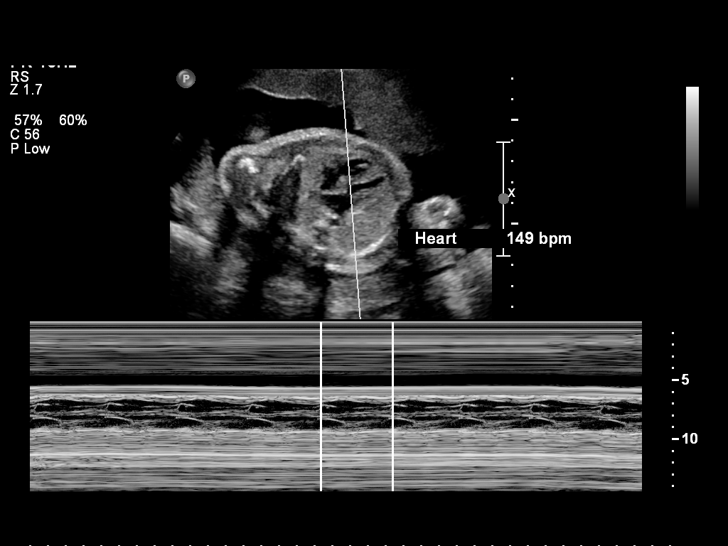
[im 23/27]
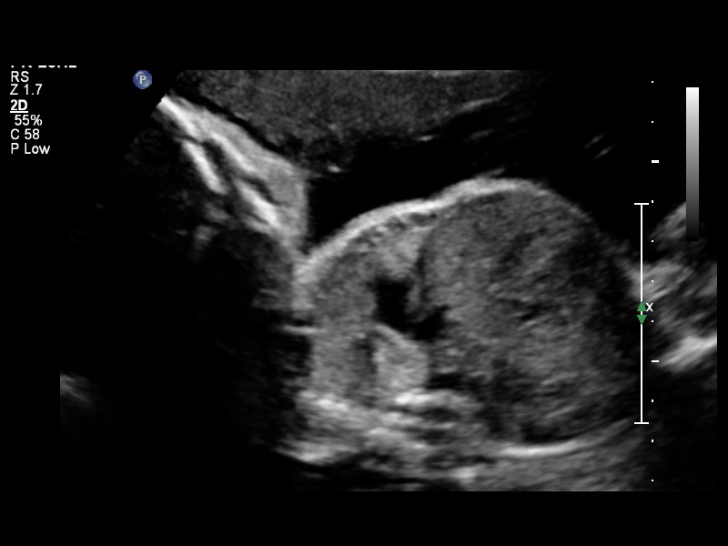
[im 25/27]
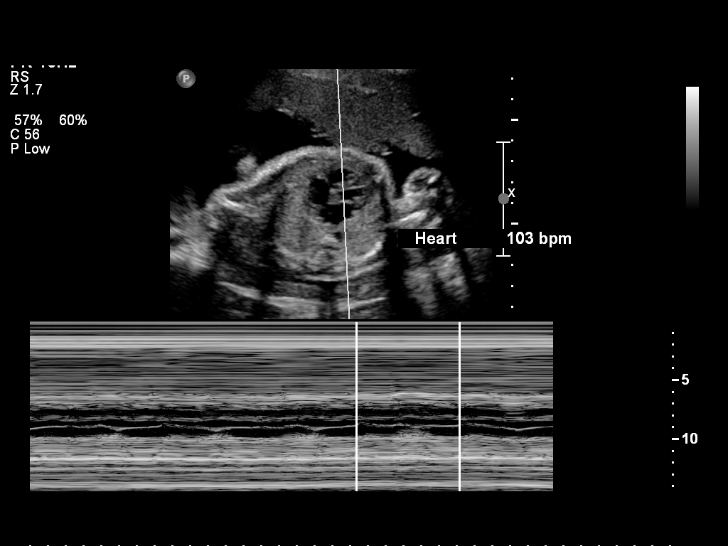
[im 27/27]
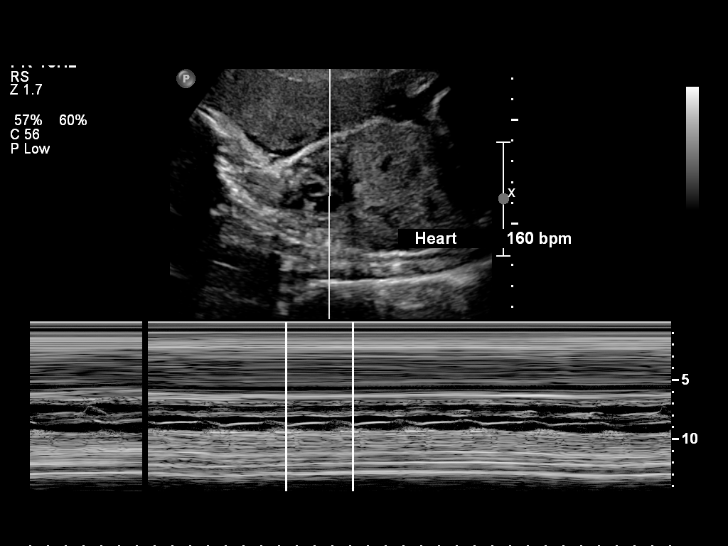

[14 of 27 positions shown; findings below may reference images not displayed]

OBSTETRICS REPORT
                      (Signed Final 07/16/2013 [DATE])

Service(s) Provided

Indications

 Vaginal bleeding, unknown etiology
 Pain - Abdominal/Pelvic
 Fetal Decelerations
Fetal Evaluation

 Num Of Fetuses:    1
 Fetal Heart Rate:  149                          bpm
 Cardiac Activity:  Observed
 Presentation:      Breech

 Amniotic Fluid
 AFI FV:      Subjectively within normal limits
                                             Larg Pckt:     6.5  cm
Biophysical Evaluation

 Amniotic F.V:   Pocket => 2 cm two         F. Tone:        Observed
                 planes
 F. Movement:    Observed                   Score:          [DATE]
 F. Breathing:   Observed
Gestational Age

 LMP:           26w 0d        Date:  01/15/13                 EDD:   10/22/13
 Best:          27w 6d     Det. By:  U/S (06/02/13)           EDD:   10/09/13
Impression

 IUP at 27+6 weeks
 Normal amniotic fluid volume
 BPP [DATE]
 Anterior placenta; no previa and no SCH identified
Recommendations

 Follow-up as clinically indicated

## 2015-03-22 ENCOUNTER — Emergency Department (HOSPITAL_COMMUNITY)
Admission: EM | Admit: 2015-03-22 | Discharge: 2015-03-23 | Disposition: A | Discharge status: Home / Self Care | Payer: Medicaid Other | Attending: Emergency Medicine | Admitting: Emergency Medicine

## 2015-03-22 ENCOUNTER — Encounter (HOSPITAL_COMMUNITY): Payer: Self-pay | Admitting: *Deleted

## 2015-03-22 ENCOUNTER — Emergency Department (HOSPITAL_COMMUNITY): Payer: Medicaid Other

## 2015-03-22 DIAGNOSIS — Z79899 Other long term (current) drug therapy: Secondary | ICD-10-CM | POA: Insufficient documentation

## 2015-03-22 DIAGNOSIS — Z791 Long term (current) use of non-steroidal anti-inflammatories (NSAID): Secondary | ICD-10-CM | POA: Insufficient documentation

## 2015-03-22 DIAGNOSIS — B349 Viral infection, unspecified: Secondary | ICD-10-CM | POA: Insufficient documentation

## 2015-03-22 DIAGNOSIS — Z872 Personal history of diseases of the skin and subcutaneous tissue: Secondary | ICD-10-CM | POA: Insufficient documentation

## 2015-03-22 DIAGNOSIS — D571 Sickle-cell disease without crisis: Secondary | ICD-10-CM | POA: Insufficient documentation

## 2015-03-22 DIAGNOSIS — R0789 Other chest pain: Secondary | ICD-10-CM | POA: Insufficient documentation

## 2015-03-22 LAB — COMPREHENSIVE METABOLIC PANEL
ALK PHOS: 68 U/L (ref 38–126)
ALT: 21 U/L (ref 14–54)
AST: 20 U/L (ref 15–41)
Albumin: 4.2 g/dL (ref 3.5–5.0)
Anion gap: 6 (ref 5–15)
BILIRUBIN TOTAL: 0.4 mg/dL (ref 0.3–1.2)
BUN: 9 mg/dL (ref 6–20)
CO2: 24 mmol/L (ref 22–32)
CREATININE: 0.86 mg/dL (ref 0.44–1.00)
Calcium: 9.4 mg/dL (ref 8.9–10.3)
Chloride: 104 mmol/L (ref 101–111)
GFR calc Af Amer: >60 mL/min (ref >60)
GLUCOSE: 90 mg/dL (ref 65–99)
Potassium: 3.7 mmol/L (ref 3.5–5.1)
Sodium: 134 mmol/L — ABNORMAL LOW (ref 135–145)
TOTAL PROTEIN: 7.2 g/dL (ref 6.5–8.1)

## 2015-03-22 LAB — CBC WITH DIFFERENTIAL/PLATELET
BASOS ABS: 0 10*3/uL (ref 0.0–0.1)
Basophils Relative: 0 %
Eosinophils Absolute: 0.1 10*3/uL (ref 0.0–0.7)
Eosinophils Relative: 2 %
HEMATOCRIT: 37 % (ref 36.0–46.0)
HEMOGLOBIN: 12.3 g/dL (ref 12.0–15.0)
LYMPHS PCT: 58 %
Lymphs Abs: 2.6 10*3/uL (ref 0.7–4.0)
MCH: 27.3 pg (ref 26.0–34.0)
MCHC: 33.2 g/dL (ref 30.0–36.0)
MCV: 82 fL (ref 78.0–100.0)
MONO ABS: 0.3 10*3/uL (ref 0.1–1.0)
Monocytes Relative: 7 %
NEUTROS ABS: 1.4 10*3/uL — AB (ref 1.7–7.7)
NEUTROS PCT: 33 %
Platelets: 194 10*3/uL (ref 150–400)
RBC: 4.51 MIL/uL (ref 3.87–5.11)
RDW: 13.9 % (ref 11.5–15.5)
WBC: 4.4 10*3/uL (ref 4.0–10.5)

## 2015-03-22 LAB — RETICULOCYTES
RBC.: 4.51 MIL/uL (ref 3.87–5.11)
RETIC COUNT ABSOLUTE: 58.6 10*3/uL (ref 19.0–186.0)
Retic Ct Pct: 1.3 % (ref 0.4–3.1)

## 2015-03-22 LAB — I-STAT TROPONIN, ED: TROPONIN I, POC: 0.01 ng/mL (ref 0.00–0.08)

## 2015-03-22 LAB — I-STAT BETA HCG BLOOD, ED (MC, WL, AP ONLY): I-stat hCG, quantitative: <5 m[IU]/mL (ref <5)

## 2015-03-22 MED ORDER — ACETAMINOPHEN 325 MG PO TABS
650.0000 mg | ORAL_TABLET | Freq: Once | ORAL | Status: AC
  Administered 2015-03-22: 650 mg via ORAL
  Filled 2015-03-22: qty 2

## 2015-03-22 MED ORDER — SODIUM CHLORIDE 0.9 % IV BOLUS (SEPSIS)
1000.0000 mL | Freq: Once | INTRAVENOUS | Status: AC
  Administered 2015-03-22: 1000 mL via INTRAVENOUS

## 2015-03-22 MED ORDER — KETOROLAC TROMETHAMINE 30 MG/ML IJ SOLN
30.0000 mg | Freq: Once | INTRAMUSCULAR | Status: AC
  Administered 2015-03-22: 30 mg via INTRAVENOUS
  Filled 2015-03-22: qty 1

## 2015-03-22 NOTE — ED Notes (Signed)
The pt is c/o rt sided chest pain with pain radiating up into her rt shoulder and rt neck..  She has had this pain for 2 weeks.  No sob sl cough..  lmp 2 weeks ago

## 2015-03-22 NOTE — ED Provider Notes (Signed)
CSN: [id number]     Arrival date & time 03/22/15  1935 History   First MD Initiated Contact with Patient 03/22/15 2120     Chief Complaint  Patient presents with  . Chest Pain     (Consider location/radiation/quality/duration/timing/severity/associated sxs/prior Treatment) The history is provided by the patient.  Megan Coleman is a 29 y.o. female sickle cell trait here with chest pain. Chest pain has been intermittent for the last 2 weeks. Patient states that occasionally the pain radiate up the right shoulder and right neck. Today pain is more constant. She also has subjective fever at night for the last week or so. Also has some chills but denies night sweats. Denies any shortness of breath or recent travel.    Past Medical History  Diagnosis Date  . Abscess     lower left abdomen/groin area  . Abdominal pain   . Generalized headaches   . Abdominal wall mass of left lower quadrant, possible endometrioma 07/31/2011  . Sickle cell anemia   . Endometriosis of abdominal wall s/p excision [id number] 07/31/2011  . Sickle cell trait   . Lymph nodes enlarged    Past Surgical History  Procedure Laterality Date  . Myomectomy  07/03/2006    In [place]  . Appendectomy  04/02/2009    [place]  . Ventral hernia repair  08/28/11    removal of endometriomas in lower abdominal wall with mesh reconstruction  . Laparoscopy  08/28/2011    Procedure: LAPAROSCOPY DIAGNOSTIC;  Surgeon: Adin Hector, MD;  Location: [facility];  Service: General;  Laterality: N/A;  Diagnostic Laparoscopy with Removal of Abdominal Wall Mass  Open Ventral Wall Hernia Repair with Mesh   . Mass excision  08/28/2011    Procedure: EXCISION MASS;  Surgeon: Adin Hector, MD;  Location: [facility];  Service: General;  Laterality: N/A;  Removal of Mass Abdominal Wall   . Ventral hernia repair  08/28/2011    Procedure: HERNIA REPAIR VENTRAL ADULT;  Surgeon: Adin Hector, MD;  Location: [facility];  Service: General;  Laterality:  N/A;   Open Ventral Wall Hernia with Mesh   . Cesarean section N/A 10/01/2013    Procedure: CESAREAN SECTION;  Surgeon: Truett Mainland, DO;  Location: [facility];  Service: Obstetrics;  Laterality: N/A;   No family history on file. Social History  Substance Use Topics  . Smoking status: Never Smoker   . Smokeless tobacco: Never Used  . Alcohol Use: No   OB History    Gravida Para Term Preterm AB TAB SAB Ectopic Multiple Living   1 1 1       1      Review of Systems  Cardiovascular: Positive for chest pain.  All other systems reviewed and are negative.     Allergies  Review of patient's allergies indicates no known allergies.  Home Medications   Prior to Admission medications   Medication Sig Start Date End Date Taking? Authorizing Provider  ferrous gluconate (FERGON) 324 MG tablet Take 1 tablet (324 mg total) by mouth daily with breakfast. 06/22/13   Allen Norris, MD  ibuprofen (ADVIL,MOTRIN) 600 MG tablet Take 1 tablet (600 mg total) by mouth every 6 (six) hours. 10/03/13   Myrtis Ser, CNM  norethindrone (ORTHO MICRONOR) 0.35 MG tablet Take 1 tablet (0.35 mg total) by mouth daily. 10/29/13   Luvenia Redden, PA-C  oxyCODONE-acetaminophen (PERCOCET/ROXICET) 5-325 MG per tablet Take 1-2 tablets by mouth every 4 (four)  hours as needed for severe pain (moderate - severe pain). 10/03/13   Myrtis Ser, CNM  Prenatal Vit-Fe Fumarate-FA (PRENATAL MULTIVITAMIN) TABS tablet Take 1 tablet by mouth daily at 12 noon. 05/25/13   Allen Norris, MD   BP 124/82 mmHg  Pulse 55  Temp(Src) 99.8 F (37.7 C)  Resp 15  Ht 5\' 7"  (1.702 m)  Wt 170 lb 9 oz (77.367 kg)  BMI 26.71 kg/m2  SpO2 100%  LMP 03/08/2015  Breastfeeding? Yes Physical Exam  Constitutional: She is oriented to person, place, and time. She appears well-nourished.  Uncomfortable   HENT:  Head: Normocephalic.  Mouth/Throat: Oropharynx is clear and moist.  Eyes: Conjunctivae are normal. Pupils are equal, round, and  reactive to light.  Neck: Normal range of motion. Neck supple.  Cardiovascular: Normal rate, regular rhythm and normal heart sounds.   Pulmonary/Chest: Effort normal and breath sounds normal. No respiratory distress. She has no wheezes. She has no rales.  Abdominal: Soft. Bowel sounds are normal. She exhibits no distension. There is no tenderness. There is no rebound.  Musculoskeletal: Normal range of motion. She exhibits no edema or tenderness.  Neurological: She is alert and oriented to person, place, and time. No cranial nerve deficit. Coordination normal.  Skin: Skin is warm and dry.  Psychiatric: She has a normal mood and affect. Her behavior is normal. Judgment and thought content normal.  Nursing note and vitals reviewed.   ED Course  Procedures (including critical care time) Labs Review Labs Reviewed  CBC WITH DIFFERENTIAL/PLATELET - Abnormal; Notable for the following:    Neutro Abs 1.4 (*)    All other components within normal limits  COMPREHENSIVE METABOLIC PANEL - Abnormal; Notable for the following:    Sodium 134 (*)    All other components within normal limits  RETICULOCYTES  I-STAT TROPOININ, ED  I-STAT BETA HCG BLOOD, ED ([facility], [facility], [facility] ONLY)    Imaging Review Dg Chest 2 View  03/22/2015   CLINICAL DATA:  Right-sided chest pain for 2 weeks. Pain radiates into the right shoulder and right arm.  EXAM: CHEST  2 VIEW  COMPARISON:  None.  FINDINGS: The cardiomediastinal contours are normal. The lungs are clear. Pulmonary vasculature is normal. No consolidation, pleural effusion, or pneumothorax. No acute osseous abnormalities are seen.  IMPRESSION: No acute pulmonary process.   Electronically Signed   By: Jeb Levering M.D.   On: 03/22/2015 21:26   I have personally reviewed and evaluated these images and lab results as part of my medical decision-making.   EKG Interpretation   Date/Time:  Wednesday March 22 2015 19:41:55 EDT Ventricular Rate:  67 PR Interval:   134 QRS Duration: 82 QT Interval:  362 QTC Calculation: 382 R Axis:   88 Text Interpretation:  Normal sinus rhythm Nonspecific ST and T wave  abnormality Abnormal ECG No significant change since last tracing  Confirmed by YAO  MD, DAVID ([id number]) on 03/22/2015 9:10:32 PM      MDM   Final diagnoses:  None    Megan Coleman is a 29 y.o. female here with chest pain, fevers. Consider pneumonia vs bronchitis. Low risk for ACS. I doub PE. Has sickle trait, no hx of acute chest and not hypoxic so I doubt acute chest. Will check labs, reti, CXR.   12:00 AM CXR clear. WBC nl. Hg stable, reticulocyte nl. Pain improved with tylenol, toradol. I think likely viral. Recommend tylenol, motrin, rest for 2 days.  Wandra Arthurs, MD 03/23/15 0001

## 2015-03-23 NOTE — Discharge Instructions (Signed)
Take tylenol and motrin for pain.   Stay hydrated.  See your doctor.   Return to ER if you have worse chest pain, trouble breathing, fevers for a week.

## 2016-01-28 ENCOUNTER — Emergency Department (HOSPITAL_COMMUNITY)
Admission: EM | Admit: 2016-01-28 | Discharge: 2016-01-28 | Disposition: A | Discharge status: Home / Self Care | Payer: Self-pay | Attending: Emergency Medicine | Admitting: Emergency Medicine

## 2016-01-28 ENCOUNTER — Emergency Department (HOSPITAL_COMMUNITY): Payer: Self-pay

## 2016-01-28 ENCOUNTER — Encounter (HOSPITAL_COMMUNITY): Payer: Self-pay | Admitting: *Deleted

## 2016-01-28 DIAGNOSIS — K529 Noninfective gastroenteritis and colitis, unspecified: Secondary | ICD-10-CM | POA: Insufficient documentation

## 2016-01-28 LAB — URINALYSIS, ROUTINE W REFLEX MICROSCOPIC
Bilirubin Urine: NEGATIVE
Glucose, UA: NEGATIVE mg/dL
Hgb urine dipstick: NEGATIVE
Ketones, ur: NEGATIVE mg/dL
Leukocytes, UA: NEGATIVE
NITRITE: NEGATIVE
PH: 6 (ref 5.0–8.0)
Protein, ur: NEGATIVE mg/dL
SPECIFIC GRAVITY, URINE: 1.029 (ref 1.005–1.030)

## 2016-01-28 LAB — LIPASE, BLOOD: LIPASE: 38 U/L (ref 11–51)

## 2016-01-28 LAB — WET PREP, GENITAL
CLUE CELLS WET PREP: NONE SEEN
Sperm: NONE SEEN
TRICH WET PREP: NONE SEEN
Yeast Wet Prep HPF POC: NONE SEEN

## 2016-01-28 LAB — I-STAT BETA HCG BLOOD, ED (MC, WL, AP ONLY)

## 2016-01-28 LAB — COMPREHENSIVE METABOLIC PANEL
ALBUMIN: 4.6 g/dL (ref 3.5–5.0)
ALK PHOS: 60 U/L (ref 38–126)
ALT: 19 U/L (ref 14–54)
AST: 25 U/L (ref 15–41)
Anion gap: 10 (ref 5–15)
BILIRUBIN TOTAL: 0.7 mg/dL (ref 0.3–1.2)
BUN: 11 mg/dL (ref 6–20)
CALCIUM: 9.4 mg/dL (ref 8.9–10.3)
CO2: 22 mmol/L (ref 22–32)
Chloride: 105 mmol/L (ref 101–111)
Creatinine, Ser: 0.9 mg/dL (ref 0.44–1.00)
GFR calc non Af Amer: >60 mL/min (ref >60)
Glucose, Bld: 104 mg/dL — ABNORMAL HIGH (ref 65–99)
Potassium: 3.8 mmol/L (ref 3.5–5.1)
SODIUM: 137 mmol/L (ref 135–145)
TOTAL PROTEIN: 8.2 g/dL — AB (ref 6.5–8.1)

## 2016-01-28 LAB — CBC
HEMATOCRIT: 41.7 % (ref 36.0–46.0)
HEMOGLOBIN: 13.6 g/dL (ref 12.0–15.0)
MCH: 27.6 pg (ref 26.0–34.0)
MCHC: 32.6 g/dL (ref 30.0–36.0)
MCV: 84.6 fL (ref 78.0–100.0)
Platelets: 201 10*3/uL (ref 150–400)
RBC: 4.93 MIL/uL (ref 3.87–5.11)
RDW: 13.9 % (ref 11.5–15.5)
WBC: 6.4 10*3/uL (ref 4.0–10.5)

## 2016-01-28 MED ORDER — HYDROMORPHONE HCL 1 MG/ML IJ SOLN
0.5000 mg | Freq: Once | INTRAMUSCULAR | Status: AC
  Administered 2016-01-28: 0.5 mg via INTRAVENOUS
  Filled 2016-01-28: qty 1

## 2016-01-28 MED ORDER — SODIUM CHLORIDE 0.9 % IV BOLUS (SEPSIS)
1000.0000 mL | Freq: Once | INTRAVENOUS | Status: AC
  Administered 2016-01-28: 1000 mL via INTRAVENOUS

## 2016-01-28 MED ORDER — IOPAMIDOL (ISOVUE-300) INJECTION 61%
INTRAVENOUS | Status: AC
  Administered 2016-01-28: 100 mL
  Filled 2016-01-28: qty 100

## 2016-01-28 MED ORDER — HYDROMORPHONE HCL 1 MG/ML IJ SOLN: 1.0000 mg | Freq: Once | INTRAMUSCULAR | Status: DC

## 2016-01-28 MED ORDER — ONDANSETRON 4 MG PO TBDP: 4.0000 mg | ORAL_TABLET | Freq: Three times a day (TID) | ORAL | 0 refills | Status: DC | PRN

## 2016-01-28 NOTE — Discharge Instructions (Signed)
Read the information below.   Your imaging is suggestive of gastroenteritis. I have prescribed an anti-nausea medication, take as needed for nausea. It is important that you stay well hydrated. You can take tylenol for pain control. Stick to clear liquids for the next day or so and slowly introduce bland foods into your diet such as toast, plain crackers, banana, apple sauce. Avoid high fat or spicy foods. You can take OTC probiotics.  Use the prescribed medication as directed.  Please discuss all new medications with your pharmacist.   It is important that you follow up with your primary care provider in the next week for re-evaluation.  You may return to the Emergency Department at any time for worsening condition or any new symptoms that concern you. Return to ED if you develop fever, inability to keep food/fluids down, blood in stool, or uncontrolled abdominal pain.

## 2016-01-28 NOTE — ED Provider Notes (Signed)
Complains of lower abdominal pain at left lower quadrant rating to suprapubic area onset 5 days ago, gradually. Last bowel movement 2 days ago. She missed of vomiting last time 9 AM today. No hematemesis. Denies vaginal discharge denies urinary symptoms. Nothing makes pain better or worse. She is treated herself with Advil, without relief. Last dose was last night. On exam alert no distress abdomen nondistended, normoactive bowel sounds minimally tender at left lower quadrant. No guarding rigidity or rebound   Orlie Dakin, MD 01/28/16 1652

## 2016-01-28 NOTE — ED Provider Notes (Signed)
[facility] DEPT Provider Note   CSN: [id number] Arrival date & time: 01/28/16  [id number]  First Provider Contact:  First MD Initiated Contact with Patient 01/28/16 1020        History   Chief Complaint Chief Complaint  Patient presents with  . Abdominal Pain  . Emesis    HPI Megan Coleman is a 30 y.o. female.  Megan Coleman is a 30 y.o. female G1P1001 with h/o sickle cell anemia, uterine fibroids s/p fibroidectomy ~2011, endometrosis of abdominal wall s/p excision 2013 presents to ED with complaint of lower abdominal pain and N/V. Patient states the lower abdominal pain started approximately 5 days ago. Pain is 8/10, sharp, intermittent in nature, located in LLQ with radiation into suprapubic region. No aggravating or reliving factors. She has associated subjective fevers, chills, dizziness, and lightheadedness. She has had 5 episodes of non-bloody emesis, last episode at 9am this morning. Her last bowel movement was 2 days ago, normal in caliber, and non-bloody. She denies dysuria, hematuria, vaginal pain, pelvic pain, vaginal bleeding, or vaginal discharge. She was seen by her PCP and was given ABX and told to take advil. Treatment has not relieved her sxs. LMP 01/16/16. She is sexually active with one female partner in the last 6 months. Inconsistent use of barrier contraception. No other form of contraception. She has had one pregnancy, full term, c-section.       Past Medical History:  Diagnosis Date  . Abdominal pain   . Abdominal wall mass of left lower quadrant, possible endometrioma 07/31/2011  . Abscess    lower left abdomen/groin area  . Endometriosis of abdominal wall s/p excision [id number] 07/31/2011  . Generalized headaches   . Lymph nodes enlarged   . Sickle cell anemia (HCC)   . Sickle cell trait [facility])     Patient Active Problem List   Diagnosis Date Noted  . Thrombocytopenia ([place]) 10/29/2013  . S/P C-section 10/01/2013  . H/O myomectomy 09/23/2013  .  Anemia, iron deficiency 06/22/2013  . Endometriosis of abdominal wall s/p excision [id number] 07/31/2011    Past Surgical History:  Procedure Laterality Date  . APPENDECTOMY  04/02/2009   In [place]  . CESAREAN SECTION N/A 10/01/2013   Procedure: CESAREAN SECTION;  Surgeon: Truett Mainland, DO;  Location: [facility];  Service: Obstetrics;  Laterality: N/A;  . LAPAROSCOPY  08/28/2011   Procedure: LAPAROSCOPY DIAGNOSTIC;  Surgeon: Adin Hector, MD;  Location: [facility];  Service: General;  Laterality: N/A;  Diagnostic Laparoscopy with Removal of Abdominal Wall Mass  Open Ventral Wall Hernia Repair with Mesh   . MASS EXCISION  08/28/2011   Procedure: EXCISION MASS;  Surgeon: Adin Hector, MD;  Location: [facility];  Service: General;  Laterality: N/A;  Removal of Mass Abdominal Wall   . MYOMECTOMY  07/03/2006   In [place]  . VENTRAL HERNIA REPAIR  08/28/11   removal of endometriomas in lower abdominal wall with mesh reconstruction  . VENTRAL HERNIA REPAIR  08/28/2011   Procedure: HERNIA REPAIR VENTRAL ADULT;  Surgeon: Adin Hector, MD;  Location: [facility];  Service: General;  Laterality: N/A;   Open Ventral Wall Hernia with Mesh     OB History    Gravida Para Term Preterm AB Living   1 1 1     1    SAB TAB Ectopic Multiple Live Births           1       Home Medications  Prior to Admission medications   Medication Sig Start Date End Date Taking? Authorizing Provider  ferrous gluconate (FERGON) 324 MG tablet Take 1 tablet (324 mg total) by mouth daily with breakfast. 06/22/13   Allen Norris, MD  ibuprofen (ADVIL,MOTRIN) 600 MG tablet Take 1 tablet (600 mg total) by mouth every 6 (six) hours. 10/03/13   Myrtis Ser, CNM  norethindrone (ORTHO MICRONOR) 0.35 MG tablet Take 1 tablet (0.35 mg total) by mouth daily. 10/29/13   Luvenia Redden, PA-C  ondansetron (ZOFRAN ODT) 4 MG disintegrating tablet Take 1 tablet (4 mg total) by mouth every 8 (eight) hours as needed for nausea or vomiting.  01/28/16   Roxanna Mew, PA-C  oxyCODONE-acetaminophen (PERCOCET/ROXICET) 5-325 MG per tablet Take 1-2 tablets by mouth every 4 (four) hours as needed for severe pain (moderate - severe pain). 10/03/13   Myrtis Ser, CNM  Prenatal Vit-Fe Fumarate-FA (PRENATAL MULTIVITAMIN) TABS tablet Take 1 tablet by mouth daily at 12 noon. 05/25/13   Allen Norris, MD    Family History History reviewed. No pertinent family history.  Social History Social History  Substance Use Topics  . Smoking status: Never Smoker  . Smokeless tobacco: Never Used  . Alcohol use No     Allergies   Review of patient's allergies indicates no known allergies.   Review of Systems Review of Systems  Constitutional: Positive for chills, fatigue and fever ( subjective).  Gastrointestinal: Positive for abdominal pain, nausea and vomiting.  Neurological: Positive for dizziness and light-headedness.  All other systems reviewed and are negative.    Physical Exam Updated Vital Signs BP 104/72 (BP Location: Left Arm)   Pulse 98   Temp 98.6 F (37 C) (Oral)   Resp 16   LMP 01/16/2016   SpO2 100%   Physical Exam  Constitutional: She appears well-developed and well-nourished. No distress.  HENT:  Head: Normocephalic and atraumatic.  Mouth/Throat: Oropharynx is clear and moist. No oropharyngeal exudate.  Eyes: Conjunctivae and EOM are normal. Pupils are equal, round, and reactive to light. Right eye exhibits no discharge. Left eye exhibits no discharge. No scleral icterus.  Neck: Normal range of motion. Neck supple.  Cardiovascular: Normal rate, regular rhythm, normal heart sounds and intact distal pulses.   No murmur heard. Pulmonary/Chest: Effort normal and breath sounds normal. No respiratory distress.  Abdominal: Soft. Bowel sounds are normal. She exhibits no distension. There is tenderness in the suprapubic area and left lower quadrant. There is no rigidity, no rebound, no guarding and no CVA  tenderness.  Genitourinary: Pelvic exam was performed with patient supine.  Genitourinary Comments: Chaperone present for duration of exam. External anatomy normal - no injury, lesions, masses, or rashes. No bleeding, lesions, masses, or ulcerations in vaginal cavity. Cervix is closed with thick, white/yellow discharge. No friability. No CMT, no adnexal tenderness, no masses palpated on bimanual exam.    Musculoskeletal: Normal range of motion.  Lymphadenopathy:    She has no cervical adenopathy.  Neurological: She is alert. Coordination normal.  Skin: Skin is warm and dry. She is not diaphoretic.  Psychiatric: She has a normal mood and affect. Her behavior is normal.     ED Treatments / Results  Labs (all labs ordered are listed, but only abnormal results are displayed) Labs Reviewed  WET PREP, GENITAL - Abnormal; Notable for the following:       Result Value   WBC, Wet Prep HPF POC PRESENT (*)    All other  components within normal limits  COMPREHENSIVE METABOLIC PANEL - Abnormal; Notable for the following:    Glucose, Bld 104 (*)    Total Protein 8.2 (*)    All other components within normal limits  URINALYSIS, ROUTINE W REFLEX MICROSCOPIC (NOT AT [facility]) - Abnormal; Notable for the following:    APPearance CLOUDY (*)    All other components within normal limits  LIPASE, BLOOD  CBC  HIV ANTIBODY (ROUTINE TESTING)  RPR  I-STAT BETA HCG BLOOD, ED ([facility], [facility], [facility] ONLY)  GC/CHLAMYDIA PROBE AMP ([place]) NOT AT [facility]    EKG  EKG Interpretation None       Radiology Ct Abdomen Pelvis W Contrast  Result Date: 01/28/2016 CLINICAL DATA:  Mid abdominal pain beginning last night with nausea and vomiting. EXAM: CT ABDOMEN AND PELVIS WITH CONTRAST TECHNIQUE: Multidetector CT imaging of the abdomen and pelvis was performed using the standard protocol following bolus administration of intravenous contrast. CONTRAST:  164mL ISOVUE-300 IOPAMIDOL (ISOVUE-300) INJECTION 61% COMPARISON:  None.  FINDINGS: Lower chest:  No acute findings. Hepatobiliary: Liver and gallbladder appear normal. Pancreas: No mass, inflammatory changes, or other significant abnormality. Spleen: Within normal limits in size and appearance. Adrenals/Urinary Tract: Adrenal glands appear normal. Kidneys appear normal without mass, stone or hydronephrosis. No perinephric inflammation. No ureteral or bladder calculi. Bladder is decompressed. Stomach/Bowel: Bowel is normal in caliber. Mild thickening of the walls of the stomach and small bowel suggesting gastroenteritis. Fluid is seen throughout the majority of the small bowel which can be a secondary sign of underlying enteritis. Patient is status post appendectomy. Vascular/Lymphatic: No pathologically enlarged lymph nodes. No evidence of abdominal aortic aneurysm. Reproductive: No mass or other significant abnormality. Other: No free fluid or abscess collection. No free intraperitoneal air. Musculoskeletal: No acute or suspicious osseous finding. Superficial soft tissues are unremarkable. IMPRESSION: 1. Findings suggest gastroenteritis, as detailed above. No bowel obstruction. 2. Remainder of the abdomen and pelvis CT is unremarkable. No free fluid or abscess. No free intraperitoneal air. No evidence of acute solid organ abnormality. No renal or ureteral calculi. Electronically Signed   By: Franki Cabot M.D.   On: 01/28/2016 12:00    Procedures Procedures (including critical care time)  Medications Ordered in ED Medications  sodium chloride 0.9 % bolus 1,000 mL (0 mLs Intravenous Stopped 01/28/16 1315)  HYDROmorphone (DILAUDID) injection 0.5 mg (0.5 mg Intravenous Given 01/28/16 1155)  iopamidol (ISOVUE-300) 61 % injection (100 mLs  Contrast Given 01/28/16 1135)     Initial Impression / Assessment and Plan / ED Course  I have reviewed the triage vital signs and the nursing notes.  Pertinent labs & imaging results that were available during my care of the patient were  reviewed by me and considered in my medical decision making (see chart for details).  Vitals:   01/28/16 1415 01/28/16 1430 01/28/16 1445 01/28/16 1500  BP: 120/73 104/66 127/65 114/64  Pulse: 67 86 75 72  Resp:      Temp:      TempSrc:      SpO2: 100% 100% 100% 100%    Clinical Course  Value Comment By Time   On re-evaluation, patient endorses improvement in pain. She still has mild TTP of abdomen; however, not as significant.  Roxanna Mew, [place] 08/06 1230  CT Abdomen Pelvis W Contrast Lungs bases normal. Cardiac silhouette unremarkable. Mild thickening of the stomach and small bowel. Normal appearing kidneys. Normal appearing liver and gallbladder. Roxanna Mew, [place] 08/06 [phone]  Patient tolerated PO fluids.  Roxanna Mew, PA-C 08/06 1500    Patient is afebrile and non-toxic appearing in NAD. Vital signs are stable. Physical exam remarkable for TTP of LLQ and suprapubic. No guarding, rigidity, or peritoneal signs noted. No CVA tenderness. Pelvic exam remarkable for thick white/yellow discharge, no blood noted, cervix closed. Will check labs and CT abd/pelvis. IVF and pain medication given.   On re-evaluation patient endorses improvement in abdominal pain. Pregnancy test negative - low suspicion for ectopic. Lipase normal - low suspicion for pancreatitis. CBC wnml. CMP re-assuring. U/A negative for UTI. Wet prep re-assuring. GC/chlamdyia/RPR/HIV pending. CT abdomen/pelvis remarkable for gastroenteritis.   Patient tolerated PO fluids. Discussed results with patient. Symptomatic management discussed. Encouraged follow up with PCP. Return precautions provided. Patient voiced understanding and is agreeable.    Final Clinical Impressions(s) / ED Diagnoses   Final diagnoses:  Gastroenteritis    New Prescriptions New Prescriptions   ONDANSETRON (ZOFRAN ODT) 4 MG DISINTEGRATING TABLET    Take 1 tablet (4 mg total) by mouth every 8 (eight) hours as needed for nausea or  vomiting.     Roxanna Mew, PA-C 01/28/16 [place], MD 01/28/16 [phone]

## 2016-01-28 NOTE — ED Triage Notes (Signed)
Pt reports LLQ pain since last night and having n/v. Denies diarrhea. Reports chills/fever.

## 2016-01-29 LAB — HIV ANTIBODY (ROUTINE TESTING W REFLEX): HIV Screen 4th Generation wRfx: NONREACTIVE

## 2016-01-29 LAB — RPR: RPR Ser Ql: NONREACTIVE

## 2016-01-29 LAB — GC/CHLAMYDIA PROBE AMP (~~LOC~~) NOT AT ARMC
CHLAMYDIA, DNA PROBE: NEGATIVE
Neisseria Gonorrhea: NEGATIVE

## 2016-07-22 ENCOUNTER — Ambulatory Visit (HOSPITAL_COMMUNITY)
Admission: EM | Admit: 2016-07-22 | Discharge: 2016-07-22 | Disposition: A | Discharge status: Home / Self Care | Payer: 59 | Attending: Emergency Medicine | Admitting: Emergency Medicine

## 2016-07-22 ENCOUNTER — Encounter (HOSPITAL_COMMUNITY): Payer: Self-pay | Admitting: Emergency Medicine

## 2016-07-22 ENCOUNTER — Ambulatory Visit (INDEPENDENT_AMBULATORY_CARE_PROVIDER_SITE_OTHER): Payer: 59

## 2016-07-22 DIAGNOSIS — R51 Headache: Secondary | ICD-10-CM | POA: Diagnosis not present

## 2016-07-22 DIAGNOSIS — S0993XA Unspecified injury of face, initial encounter: Secondary | ICD-10-CM

## 2016-07-22 MED ORDER — HYDROCODONE-ACETAMINOPHEN 5-325 MG PO TABS: 1.0000 | ORAL_TABLET | ORAL | 0 refills | Status: DC | PRN

## 2016-07-22 NOTE — ED Provider Notes (Signed)
[facility]    CSN: [id number] Arrival date & time: 07/22/16  1038     History   Chief Complaint Chief Complaint  Patient presents with  . Facial Pain    HPI MAURINA VILLAMAR is a 31 y.o. female.   HPI  She is a 31 year old woman here for evaluation of right facial pain. She states she slipped and fell in the shower yesterday. She hit the right maxillary part of her face. She has had pain and swelling from below her eye to her jaw since then. She took some Advil last night which did help with the pain. She denies any change in her vision or eye pain. Denies any pain with eye movements. It is painful to open and close her mouth. She denies any dizziness, palpitations, chest pain, or shortness of breath associated with the fall.  Past Medical History:  Diagnosis Date  . Abdominal pain   . Abdominal wall mass of left lower quadrant, possible endometrioma 07/31/2011  . Abscess    lower left abdomen/groin area  . Endometriosis of abdominal wall s/p excision [id number] 07/31/2011  . Generalized headaches   . Lymph nodes enlarged   . Sickle cell anemia (HCC)   . Sickle cell trait [facility])     Patient Active Problem List   Diagnosis Date Noted  . Thrombocytopenia ([place]) 10/29/2013  . S/P C-section 10/01/2013  . H/O myomectomy 09/23/2013  . Anemia, iron deficiency 06/22/2013  . Endometriosis of abdominal wall s/p excision [id number] 07/31/2011    Past Surgical History:  Procedure Laterality Date  . APPENDECTOMY  04/02/2009   In [place]  . CESAREAN SECTION N/A 10/01/2013   Procedure: CESAREAN SECTION;  Surgeon: Truett Mainland, DO;  Location: [facility];  Service: Obstetrics;  Laterality: N/A;  . LAPAROSCOPY  08/28/2011   Procedure: LAPAROSCOPY DIAGNOSTIC;  Surgeon: Adin Hector, MD;  Location: [facility];  Service: General;  Laterality: N/A;  Diagnostic Laparoscopy with Removal of Abdominal Wall Mass  Open Ventral Wall Hernia Repair with Mesh   . MASS EXCISION  08/28/2011   Procedure: EXCISION MASS;  Surgeon: Adin Hector, MD;  Location: [facility];  Service: General;  Laterality: N/A;  Removal of Mass Abdominal Wall   . MYOMECTOMY  07/03/2006   In [place]  . VENTRAL HERNIA REPAIR  08/28/11   removal of endometriomas in lower abdominal wall with mesh reconstruction  . VENTRAL HERNIA REPAIR  08/28/2011   Procedure: HERNIA REPAIR VENTRAL ADULT;  Surgeon: Adin Hector, MD;  [facility];  Service: General;  Laterality: N/A;   Open Ventral Wall Hernia with Mesh     OB History    Gravida Para Term Preterm AB Living   1 1 1     1    SAB TAB Ectopic Multiple Live Births           1       Home Medications    Prior to Admission medications   Medication Sig Start Date End Date Taking? Authorizing Provider  ibuprofen (ADVIL,MOTRIN) 600 MG tablet Take 1 tablet (600 mg total) by mouth every 6 (six) hours. 10/03/13  Yes Myrtis Ser, CNM  HYDROcodone-acetaminophen (NORCO) 5-325 MG tablet Take 1 tablet by mouth every 4 (four) hours as needed for moderate pain. 07/22/16   Melony Overly, MD    Family History History reviewed. No pertinent family history.  Social History Social History  Substance Use Topics  . Smoking status: Never Smoker  .  Smokeless tobacco: Never Used  . Alcohol use No     Allergies   Patient has no known allergies.   Review of Systems Review of Systems As in history of present illness  Physical Exam Triage Vital Signs ED Triage Vitals  Enc Vitals Group     BP 07/22/16 1133 (!) 88/43     Pulse Rate 07/22/16 1133 73     Resp 07/22/16 1133 16     Temp 07/22/16 1133 99.3 F (37.4 C)     Temp Source 07/22/16 1133 Oral     SpO2 07/22/16 1133 100 %     Weight --      Height --      Head Circumference --      Peak Flow --      Pain Score 07/22/16 1132 9     Pain Loc --      Pain Edu? --      Excl. in [place]? --    No data found.   Updated Vital Signs BP (!) 88/43 (BP Location: Right Arm)   Pulse 73   Temp 99.3 F (37.4  C) (Oral)   Resp 16   SpO2 100%   Visual Acuity Right Eye Distance:   Left Eye Distance:   Bilateral Distance:    Right Eye Near:   Left Eye Near:    Bilateral Near:     Physical Exam  Constitutional: She is oriented to person, place, and time. She appears well-developed and well-nourished. No distress.  HENT:  She has some swelling and bruising, primarily below the right thigh. This does extend down to the lip. She is able to open and close her mouth, but with pain. She is exquisitely tender over the right zygomatic arch.  Eyes: EOM are normal. Pupils are equal, round, and reactive to light.  Cardiovascular: Normal rate.   Pulmonary/Chest: Effort normal.  Neurological: She is alert and oriented to person, place, and time.     UC Treatments / Results  Labs (all labs ordered are listed, but only abnormal results are displayed) Labs Reviewed - No data to display  EKG  EKG Interpretation None       Radiology Dg Facial Bones Complete  Result Date: 07/22/2016 CLINICAL DATA:  Left facial pain after fall last night. EXAM: FACIAL BONES COMPLETE 3+V COMPARISON:  None. FINDINGS: There is no evidence of fracture or other significant bone abnormality. No orbital emphysema or sinus air-fluid levels are seen. IMPRESSION: Negative. Electronically Signed   By: Marijo Conception, M.D.   On: 07/22/2016 12:19    Procedures Procedures (including critical care time)  Medications Ordered in UC Medications - No data to display   Initial Impression / Assessment and Plan / UC Course  I have reviewed the triage vital signs and the nursing notes.  Pertinent labs & imaging results that were available during my care of the patient were reviewed by me and considered in my medical decision making (see chart for details).     X-ray negative for fracture. Symptomatic treatment with ice, Tylenol, and ibuprofen. Prescription provided for hydrocodone to use as needed for severe pain. Follow-up as  needed.  Final Clinical Impressions(s) / UC Diagnoses   Final diagnoses:  Facial injury, initial encounter    New Prescriptions New Prescriptions   HYDROCODONE-ACETAMINOPHEN (NORCO) 5-325 MG TABLET    Take 1 tablet by mouth every 4 (four) hours as needed for moderate pain.     Melony Overly, MD 07/22/16  1232  

## 2016-07-22 NOTE — Discharge Instructions (Signed)
Your x-ray is negative. The do have some bruising and swelling. Apply ice frequently for the next 24 hours. Take Tylenol or ibuprofen as needed for pain. Use the hydrocodone every 4-6 hours as needed for severe pain. Do not drive while taking this medicine. Follow-up as needed.

## 2016-07-22 NOTE — ED Triage Notes (Signed)
The patient presented to the [facility] with a complaint of right side facial pain and swelling that started last night. The patient denied any known injury.

## 2016-10-04 IMAGING — DX DG CHEST 2V
2 series · 2 of 2 positions shown · non-contrast
Comparison: None.

CLINICAL DATA: Right-sided chest pain for 2 weeks. Pain radiates
into the right shoulder and right arm.

EXAM:
CHEST  2 VIEW

[chest pa]
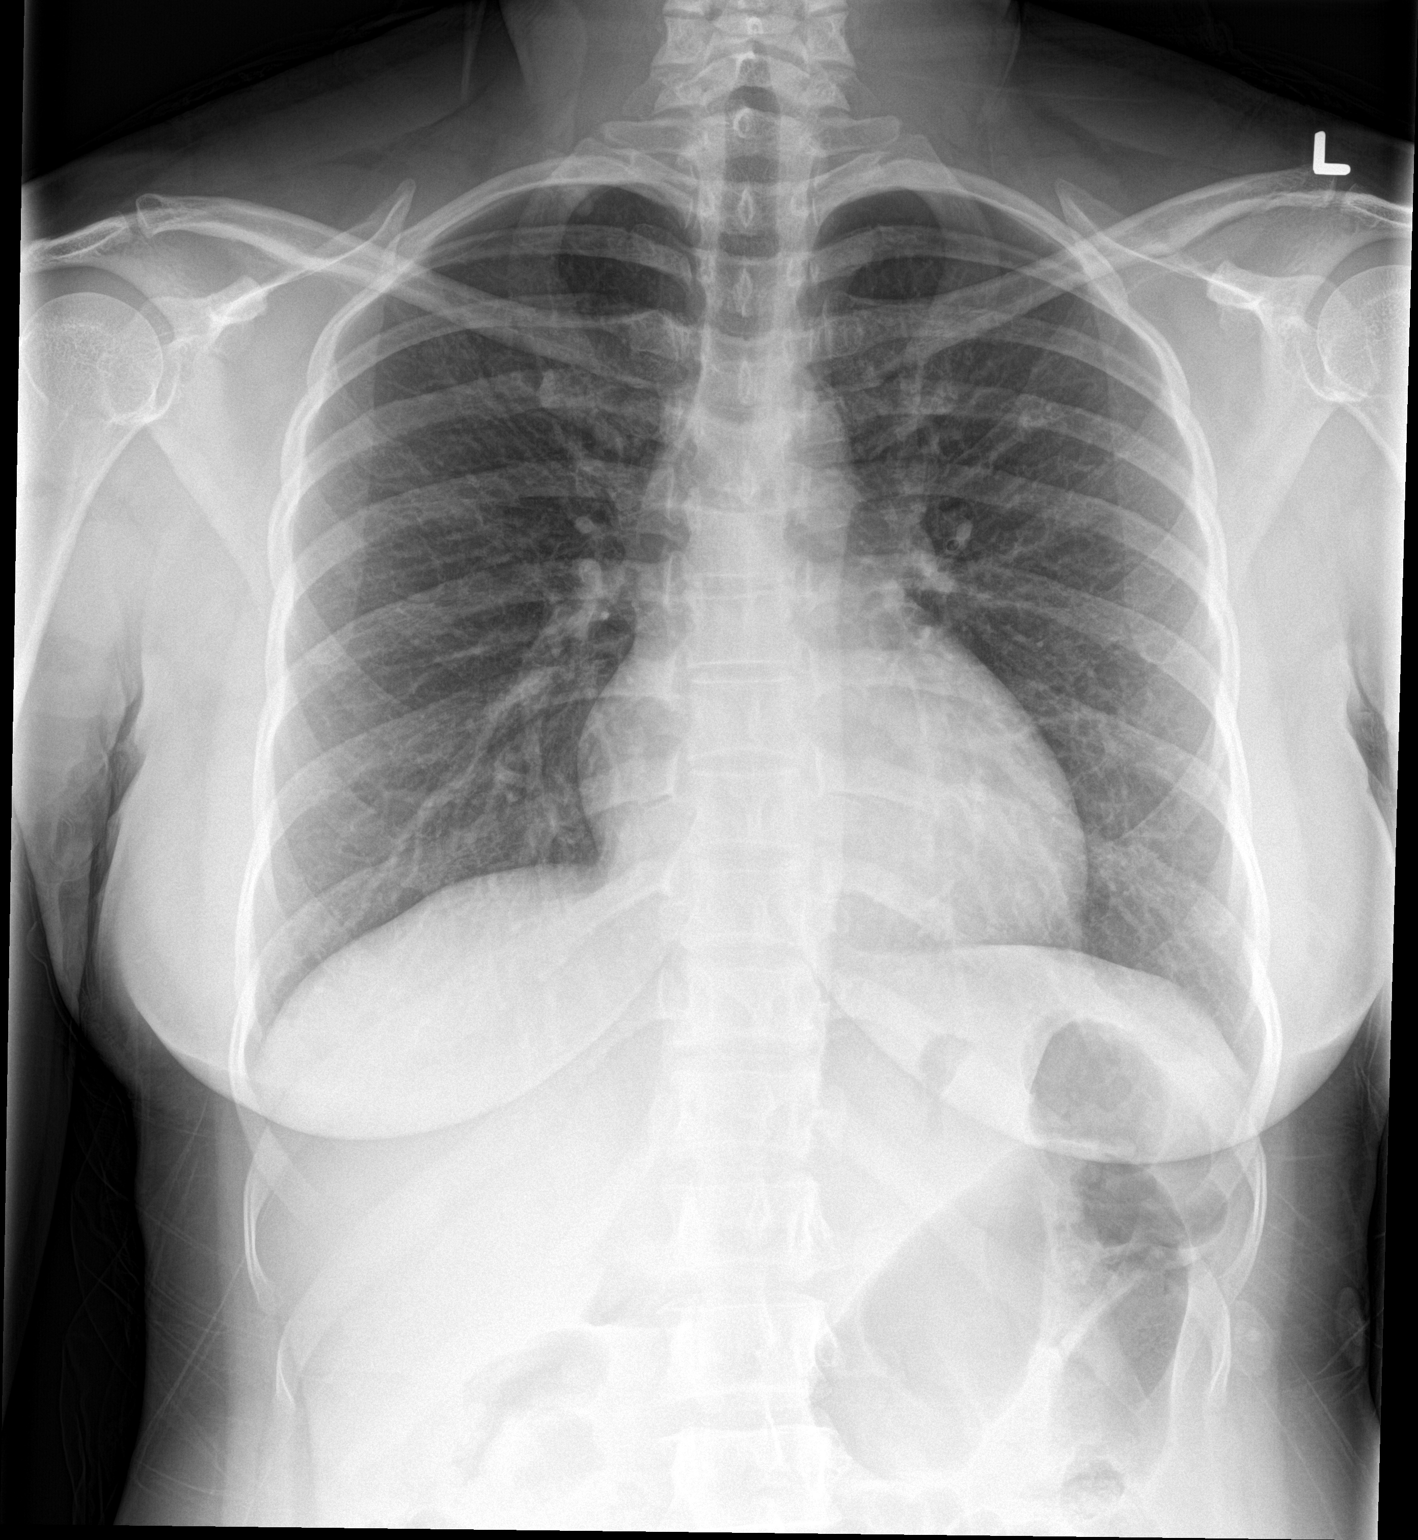

[chest lat]
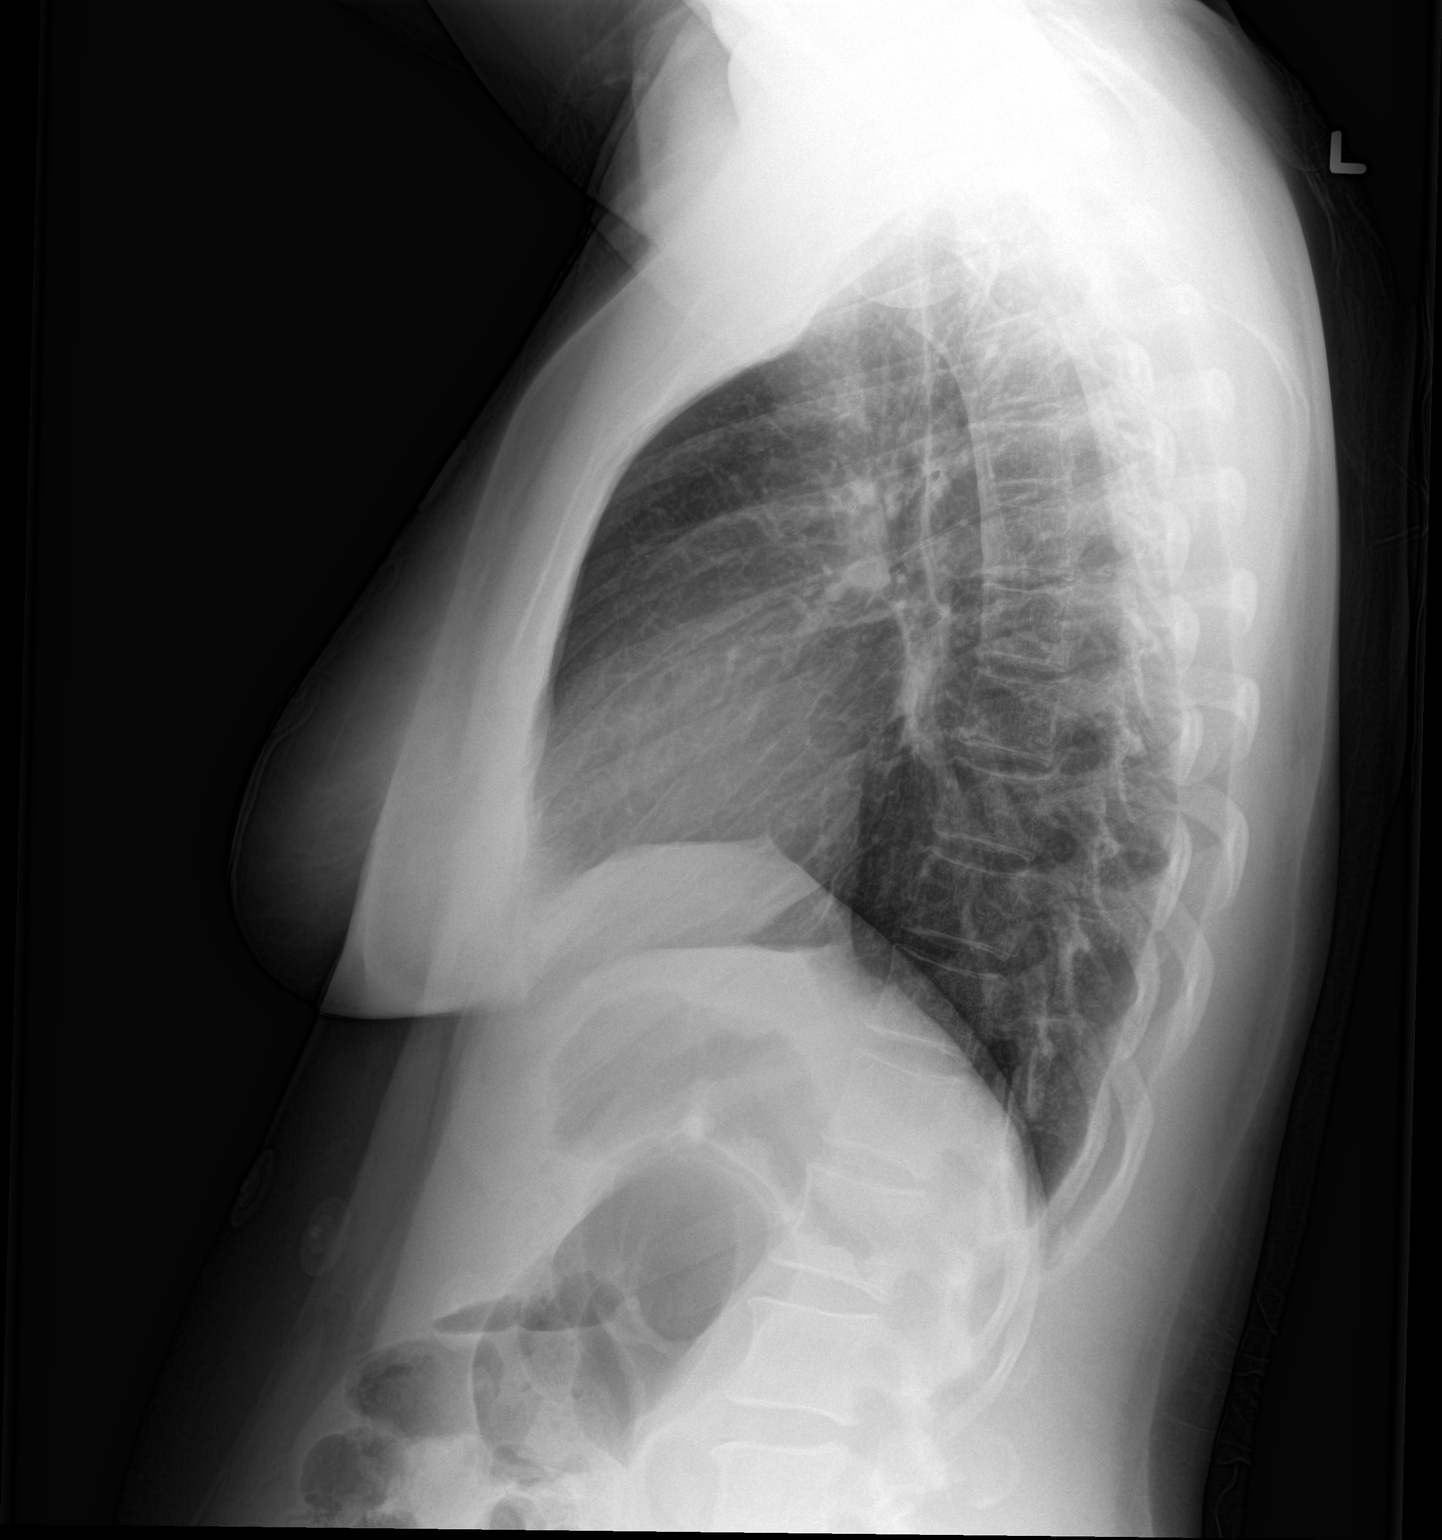

[2 of 2 positions shown; findings below may reference images not displayed]

FINDINGS: The cardiomediastinal contours are normal. The lungs are clear.
Pulmonary vasculature is normal. No consolidation, pleural effusion,
or pneumothorax. No acute osseous abnormalities are seen.
IMPRESSION: No acute pulmonary process.

## 2017-04-17 ENCOUNTER — Encounter (HOSPITAL_COMMUNITY): Payer: Self-pay | Admitting: *Deleted

## 2017-04-17 ENCOUNTER — Emergency Department (HOSPITAL_COMMUNITY)
Admission: EM | Admit: 2017-04-17 | Discharge: 2017-04-17 | Disposition: A | Discharge status: Home / Self Care | Payer: 59 | Attending: Emergency Medicine | Admitting: Emergency Medicine

## 2017-04-17 DIAGNOSIS — R51 Headache: Secondary | ICD-10-CM | POA: Insufficient documentation

## 2017-04-17 DIAGNOSIS — R519 Headache, unspecified: Secondary | ICD-10-CM

## 2017-04-17 MED ORDER — KETOROLAC TROMETHAMINE 30 MG/ML IJ SOLN
30.0000 mg | Freq: Once | INTRAMUSCULAR | Status: AC
  Administered 2017-04-17: 30 mg via INTRAVENOUS
  Filled 2017-04-17: qty 1

## 2017-04-17 MED ORDER — METOCLOPRAMIDE HCL 5 MG/ML IJ SOLN
10.0000 mg | Freq: Once | INTRAMUSCULAR | Status: AC
  Administered 2017-04-17: 10 mg via INTRAVENOUS
  Filled 2017-04-17: qty 2

## 2017-04-17 NOTE — ED Notes (Signed)
Patient Alert and oriented X4. Stable and ambulatory. Patient verbalized understanding of the discharge instructions.  Patient belongings were taken by the patient.  

## 2017-04-17 NOTE — ED Provider Notes (Signed)
[facility] EMERGENCY DEPARTMENT Provider Note   CSN: [id number] Arrival date & time: 04/17/17  1853     History   Chief Complaint Chief Complaint  Patient presents with  . Headache    HPI Megan Coleman is a 31 y.o. female.  31 year old female with a history of endometriosis, generalized headaches, and sickle cell trait presents to the emergency department for evaluation of migraine. Patient states that she has had a left-sided facial headache over the past one week. She has been taking ibuprofen for symptoms without relief. She notes that this usually improves her pain. She reports associated photophobia and phonophobia area and no recent head injury or trauma inciting exacerbation of symptoms. She has not had any fever, syncope, nausea, vomiting, extremity numbness or paresthesias, extremity weakness. LMP 04/14/17.    Headache   This is a recurrent problem. Episode onset: 1 week ago. The problem occurs constantly. The problem has not changed since onset.The pain is located in the left unilateral region. The pain is moderate. The pain radiates to the face. Pertinent negatives include no fever, no nausea and no vomiting. She has tried NSAIDs for the symptoms. The treatment provided no relief.    Past Medical History:  Diagnosis Date  . Abdominal pain   . Abdominal wall mass of left lower quadrant, possible endometrioma 07/31/2011  . Abscess    lower left abdomen/groin area  . Endometriosis of abdominal wall s/p excision [id number] 07/31/2011  . Generalized headaches   . Lymph nodes enlarged   . Sickle cell anemia (HCC)   . Sickle cell trait [facility])     Patient Active Problem List   Diagnosis Date Noted  . Thrombocytopenia ([place]) 10/29/2013  . S/P C-section 10/01/2013  . H/O myomectomy 09/23/2013  . Anemia, iron deficiency 06/22/2013  . Endometriosis of abdominal wall s/p excision [id number] 07/31/2011    Past Surgical History:  Procedure Laterality Date    . APPENDECTOMY  04/02/2009   In [place]  . CESAREAN SECTION N/A 10/01/2013   Procedure: CESAREAN SECTION;  Surgeon: Truett Mainland, DO;  Location: [facility];  Service: Obstetrics;  Laterality: N/A;  . LAPAROSCOPY  08/28/2011   Procedure: LAPAROSCOPY DIAGNOSTIC;  Surgeon: Adin Hector, MD;  Location: [facility];  Service: General;  Laterality: N/A;  Diagnostic Laparoscopy with Removal of Abdominal Wall Mass  Open Ventral Wall Hernia Repair with Mesh   . MASS EXCISION  08/28/2011   Procedure: EXCISION MASS;  Surgeon: Adin Hector, MD;  Location: [facility];  Service: General;  Laterality: N/A;  Removal of Mass Abdominal Wall   . MYOMECTOMY  07/03/2006   In [place]  . VENTRAL HERNIA REPAIR  08/28/11   removal of endometriomas in lower abdominal wall with mesh reconstruction  . VENTRAL HERNIA REPAIR  08/28/2011   Procedure: HERNIA REPAIR VENTRAL ADULT;  Surgeon: Adin Hector, MD;  Location: [facility];  Service: General;  Laterality: N/A;   Open Ventral Wall Hernia with Mesh     OB History    Gravida Para Term Preterm AB Living   1 1 1     1    SAB TAB Ectopic Multiple Live Births           1       Home Medications    Prior to Admission medications   Medication Sig Start Date End Date Taking? Authorizing Provider  ibuprofen (ADVIL,MOTRIN) 600 MG tablet Take 1 tablet (600 mg total) by mouth every 6 (  six) hours. 10/03/13  Yes Myrtis Ser, CNM  HYDROcodone-acetaminophen (NORCO) 5-325 MG tablet Take 1 tablet by mouth every 4 (four) hours as needed for moderate pain. Patient not taking: Reported on 04/17/2017 07/22/16   Melony Overly, MD    Family History No family history on file.  Social History Social History  Substance Use Topics  . Smoking status: Never Smoker  . Smokeless tobacco: Never Used  . Alcohol use No     Allergies   Patient has no known allergies.   Review of Systems Review of Systems  Constitutional: Negative for fever.  Gastrointestinal: Negative for nausea  and vomiting.  Neurological: Positive for headaches.   Ten systems reviewed and are negative for acute change, except as noted in the HPI.    Physical Exam Updated Vital Signs BP 117/73   Pulse 60   Temp 98.4 F (36.9 C) (Oral)   Resp 19   Ht 5\' 6"  (1.676 m)   Wt 77.1 kg (170 lb)   LMP 04/10/2017   SpO2 99%   BMI 27.44 kg/m   Physical Exam  Constitutional: She is oriented to person, place, and time. She appears well-developed and well-nourished. No distress.  Nontoxic and in NAD  HENT:  Head: Normocephalic and atraumatic.  Symmetric rise of the uvula with phonation.  Eyes: Pupils are equal, round, and reactive to light. Conjunctivae and EOM are normal. No scleral icterus.  Neck: Normal range of motion.  No meningismus  Cardiovascular: Normal rate, regular rhythm and intact distal pulses.   Pulmonary/Chest: Effort normal. No respiratory distress.  Respirations even and unlabored  Musculoskeletal: Normal range of motion.  Neurological: She is alert and oriented to person, place, and time. No cranial nerve deficit. She exhibits normal muscle tone. Coordination normal.  GCS 15. Speech is goal oriented. No cranial nerve deficits appreciated; symmetric eyebrow raise, no facial drooping, tongue midline. Patient has equal grip strength bilaterally with 5/5 strength against resistance in all major muscle groups bilaterally. Sensation to light touch intact. Patient moves extremities without ataxia. Normal finger-nose-finger.   Skin: Skin is warm and dry. No rash noted. She is not diaphoretic. No erythema. No pallor.  Psychiatric: She has a normal mood and affect. Her behavior is normal.  Nursing note and vitals reviewed.    ED Treatments / Results  Labs (all labs ordered are listed, but only abnormal results are displayed) Labs Reviewed - No data to display  EKG  EKG Interpretation None       Radiology No results found.  Procedures Procedures (including critical care  time)  Medications Ordered in ED Medications  ketorolac (TORADOL) 30 MG/ML injection 30 mg (30 mg Intravenous Given 04/17/17 2228)  metoCLOPramide (REGLAN) injection 10 mg (10 mg Intravenous Given 04/17/17 2228)    11:50 PM Patient reassessed. States headache has improved and she wants to go home.   Initial Impression / Assessment and Plan / ED Course  I have reviewed the triage vital signs and the nursing notes.  Pertinent labs & imaging results that were available during my care of the patient were reviewed by me and considered in my medical decision making (see chart for details).     Patient's headache treated and improved while in the ED. Presentation is similar past headache episodes and not concerning for [facility], ICH, meningitis, or temporal arteritis. Patient is afebrile without focal neurologic deficits, nuchal rigidity, or meningismus. Doubt emergent cause of symptoms today. Patient appropriate for further follow up on an  outpatient basis. Return precautions discussed and provided. Patient discharged in stable condition with no unaddressed concerns.   Final Clinical Impressions(s) / ED Diagnoses   Final diagnoses:  Bad headache    New Prescriptions New Prescriptions   No medications on file     Antonietta Breach, Hershal Coria 04/18/17 Theda Sers, MD 04/21/17 [phone]

## 2017-04-17 NOTE — ED Notes (Signed)
Patient ambulatory to the room, no current signs of distress

## 2017-04-17 NOTE — ED Triage Notes (Signed)
The pt is c/o a headache for one week no n or v  Hx of headaches  She takes advil and that has helped a little  lmp  monday

## 2017-05-07 DIAGNOSIS — G44301 Post-traumatic headache, unspecified, intractable: Secondary | ICD-10-CM | POA: Diagnosis not present

## 2017-07-29 DIAGNOSIS — G4459 Other complicated headache syndrome: Secondary | ICD-10-CM | POA: Diagnosis not present

## 2017-08-19 DIAGNOSIS — Z13228 Encounter for screening for other metabolic disorders: Secondary | ICD-10-CM | POA: Diagnosis not present

## 2017-08-26 DIAGNOSIS — G43719 Chronic migraine without aura, intractable, without status migrainosus: Secondary | ICD-10-CM | POA: Diagnosis not present

## 2017-08-26 DIAGNOSIS — Z0000 Encounter for general adult medical examination without abnormal findings: Secondary | ICD-10-CM | POA: Diagnosis not present

## 2017-08-26 DIAGNOSIS — R519 Headache, unspecified: Secondary | ICD-10-CM | POA: Insufficient documentation

## 2017-10-07 ENCOUNTER — Inpatient Hospital Stay (HOSPITAL_COMMUNITY)
Admission: AD | Admit: 2017-10-07 | Discharge: 2017-10-07 | Disposition: A | Discharge status: Home / Self Care | Payer: 59 | Source: Ambulatory Visit | Attending: Obstetrics and Gynecology | Admitting: Obstetrics and Gynecology

## 2017-10-07 ENCOUNTER — Inpatient Hospital Stay (HOSPITAL_COMMUNITY): Payer: 59

## 2017-10-07 ENCOUNTER — Encounter: Payer: Self-pay | Admitting: Student

## 2017-10-07 DIAGNOSIS — R102 Pelvic and perineal pain: Secondary | ICD-10-CM | POA: Diagnosis not present

## 2017-10-07 DIAGNOSIS — O99611 Diseases of the digestive system complicating pregnancy, first trimester: Secondary | ICD-10-CM | POA: Diagnosis not present

## 2017-10-07 DIAGNOSIS — O219 Vomiting of pregnancy, unspecified: Secondary | ICD-10-CM | POA: Insufficient documentation

## 2017-10-07 DIAGNOSIS — K5900 Constipation, unspecified: Secondary | ICD-10-CM | POA: Insufficient documentation

## 2017-10-07 DIAGNOSIS — J00 Acute nasopharyngitis [common cold]: Secondary | ICD-10-CM | POA: Diagnosis not present

## 2017-10-07 DIAGNOSIS — Z3A01 Less than 8 weeks gestation of pregnancy: Secondary | ICD-10-CM | POA: Insufficient documentation

## 2017-10-07 DIAGNOSIS — R109 Unspecified abdominal pain: Secondary | ICD-10-CM | POA: Insufficient documentation

## 2017-10-07 DIAGNOSIS — O26891 Other specified pregnancy related conditions, first trimester: Secondary | ICD-10-CM

## 2017-10-07 DIAGNOSIS — Z3491 Encounter for supervision of normal pregnancy, unspecified, first trimester: Secondary | ICD-10-CM | POA: Diagnosis not present

## 2017-10-07 LAB — CBC
HEMATOCRIT: 33.2 % — AB (ref 36.0–46.0)
HEMOGLOBIN: 11.1 g/dL — AB (ref 12.0–15.0)
MCH: 27.3 pg (ref 26.0–34.0)
MCHC: 33.4 g/dL (ref 30.0–36.0)
MCV: 81.8 fL (ref 78.0–100.0)
Platelets: 182 10*3/uL (ref 150–400)
RBC: 4.06 MIL/uL (ref 3.87–5.11)
RDW: 14.6 % (ref 11.5–15.5)
WBC: 5.3 10*3/uL (ref 4.0–10.5)

## 2017-10-07 LAB — URINALYSIS, ROUTINE W REFLEX MICROSCOPIC
Bilirubin Urine: NEGATIVE
Glucose, UA: NEGATIVE mg/dL
HGB URINE DIPSTICK: NEGATIVE
Ketones, ur: NEGATIVE mg/dL
LEUKOCYTES UA: NEGATIVE
Nitrite: NEGATIVE
PROTEIN: NEGATIVE mg/dL
SPECIFIC GRAVITY, URINE: 1.006 (ref 1.005–1.030)
pH: 7 (ref 5.0–8.0)

## 2017-10-07 LAB — POCT PREGNANCY, URINE: PREG TEST UR: POSITIVE — AB

## 2017-10-07 LAB — HCG, QUANTITATIVE, PREGNANCY: HCG, BETA CHAIN, QUANT, S: 54018 m[IU]/mL — AB (ref <5)

## 2017-10-07 MED ORDER — DOCUSATE SODIUM 100 MG PO CAPS: 100.0000 mg | ORAL_CAPSULE | Freq: Two times a day (BID) | ORAL | 0 refills | Status: DC

## 2017-10-07 MED ORDER — PROMETHAZINE HCL 25 MG PO TABS: 25.0000 mg | ORAL_TABLET | Freq: Four times a day (QID) | ORAL | 0 refills | Status: DC | PRN

## 2017-10-07 NOTE — MAU Provider Note (Signed)
History     CSN: [id number]  Arrival date and time: 10/07/17 1741   First Provider Initiated Contact with Patient 10/07/17 1933      Chief Complaint  Patient presents with  . Abdominal Pain  . Possible Pregnancy  . Headache  . Nasal Congestion   HPI Megan Coleman is a 32 y.o. G2P1001 at [redacted]w[redacted]d by LMP who presents with abdominal pain & nasal congestion.  Reports positive pregnancy test at home with LMP of 08/26/17. Has had lower abdominal pain for the last 3 days. Describes as constant lower abdominal cramping that she rates 9/10. Has not treated symptoms. Nothing makes better or worse. Some nausea and vomits at least once daily. Last BM was on Saturday & was small hard stool. Has not been treating nausea or constipation. Denies vaginal bleeding, dysuria, or vaginal discharge.   Cold symptoms for last week. Endorses sinus pain, rhinorrhea, and headache. Has not taken anything for symptoms b/c she's pregnant and wasn't sure what she could take. Denies ear pain, sore throat, cough, fever/chills, or body aches.    OB History    Gravida  2   Para  1   Term  1   Preterm      AB      Living  1     SAB      TAB      Ectopic      Multiple      Live Births  1           Past Medical History:  Diagnosis Date  . Abdominal wall mass of left lower quadrant, possible endometrioma 07/31/2011  . Abscess    lower left abdomen/groin area  . Endometriosis of abdominal wall s/p excision [id number] 07/31/2011  . Generalized headaches   . Lymph nodes enlarged   . Sickle cell anemia (HCC)   . Sickle cell trait [facility])     Past Surgical History:  Procedure Laterality Date  . APPENDECTOMY  04/02/2009   In [place]  . CESAREAN SECTION N/A 10/01/2013   Procedure: CESAREAN SECTION;  Surgeon: Truett Mainland, DO;  Location: [facility];  Service: Obstetrics;  Laterality: N/A;  . LAPAROSCOPY  08/28/2011   Procedure: LAPAROSCOPY DIAGNOSTIC;  Surgeon: Adin Hector, MD;  Location: [facility];  Service: General;  Laterality: N/A;  Diagnostic Laparoscopy with Removal of Abdominal Wall Mass  Open Ventral Wall Hernia Repair with Mesh   . MASS EXCISION  08/28/2011   Procedure: EXCISION MASS;  Surgeon: Adin Hector, MD;  Location: [facility];  Service: General;  Laterality: N/A;  Removal of Mass Abdominal Wall   . MYOMECTOMY  07/03/2006   In [place]  . VENTRAL HERNIA REPAIR  08/28/2011   Procedure: HERNIA REPAIR VENTRAL ADULT;  Surgeon: Adin Hector, MD;  Location: [facility];  Service: General;  Laterality: N/A;   Open Ventral Wall Hernia with Mesh     History reviewed. No pertinent family history.  Social History   Tobacco Use  . Smoking status: Never Smoker  . Smokeless tobacco: Never Used  Substance Use Topics  . Alcohol use: No  . Drug use: No    Allergies: No Known Allergies  Medications Prior to Admission  Medication Sig Dispense Refill Last Dose  . HYDROcodone-acetaminophen (NORCO) 5-325 MG tablet Take 1 tablet by mouth every 4 (four) hours as needed for moderate pain. (Patient not taking: Reported on 04/17/2017) 15 tablet 0 Completed Course at Unknown time  .  ibuprofen (ADVIL,MOTRIN) 600 MG tablet Take 1 tablet (600 mg total) by mouth every 6 (six) hours. 50 tablet 1 prn    Review of Systems  Constitutional: Negative.   HENT: Positive for rhinorrhea, sinus pressure and sinus pain. Negative for congestion, ear pain, nosebleeds, sneezing and sore throat.   Respiratory: Negative.   Gastrointestinal: Positive for abdominal pain, constipation, nausea and vomiting. Negative for blood in stool and diarrhea.  Genitourinary: Negative.   Neurological: Positive for headaches.   Physical Exam   Blood pressure 118/66, pulse 88, temperature 98.8 F (37.1 C), resp. rate 16, last menstrual period 08/26/2017, SpO2 100 %, currently breastfeeding.  Physical Exam  Nursing note and vitals reviewed. Constitutional: She is oriented to person, place, and time. She appears  well-developed and well-nourished. No distress.  HENT:  Head: Normocephalic and atraumatic.  Right Ear: Tympanic membrane normal.  Left Ear: Tympanic membrane normal.  Nose: Right sinus exhibits frontal sinus tenderness. Right sinus exhibits no maxillary sinus tenderness. Left sinus exhibits frontal sinus tenderness. Left sinus exhibits no maxillary sinus tenderness.  Mouth/Throat: Oropharynx is clear and moist.  Eyes: Conjunctivae are normal. Right eye exhibits no discharge. Left eye exhibits no discharge. No scleral icterus.  Neck: Normal range of motion.  Cardiovascular: Normal rate, regular rhythm and normal heart sounds.  No murmur heard. Respiratory: Effort normal and breath sounds normal. No respiratory distress. She has no wheezes.  GI: Soft. Bowel sounds are normal. She exhibits no distension. There is no tenderness. There is no rigidity, no rebound and no guarding.  Neurological: She is alert and oriented to person, place, and time.  Skin: Skin is warm and dry. She is not diaphoretic.  Psychiatric: She has a normal mood and affect. Her behavior is normal. Judgment and thought content normal.    MAU Course  Procedures Results for orders placed or performed during the hospital encounter of 10/07/17 (from the past 24 hour(s))  Urinalysis, Routine w reflex microscopic     Status: Abnormal   Collection Time: 10/07/17  6:14 PM  Result Value Ref Range   Color, Urine STRAW (A) YELLOW   APPearance CLEAR CLEAR   Specific Gravity, Urine 1.006 1.005 - 1.030   pH 7.0 5.0 - 8.0   Glucose, UA NEGATIVE NEGATIVE mg/dL   Hgb urine dipstick NEGATIVE NEGATIVE   Bilirubin Urine NEGATIVE NEGATIVE   Ketones, ur NEGATIVE NEGATIVE mg/dL   Protein, ur NEGATIVE NEGATIVE mg/dL   Nitrite NEGATIVE NEGATIVE   Leukocytes, UA NEGATIVE NEGATIVE  Pregnancy, urine POC     Status: Abnormal   Collection Time: 10/07/17  6:25 PM  Result Value Ref Range   Preg Test, Ur POSITIVE (A) NEGATIVE  CBC     Status:  Abnormal   Collection Time: 10/07/17  7:07 PM  Result Value Ref Range   WBC 5.3 4.0 - 10.5 K/uL   RBC 4.06 3.87 - 5.11 MIL/uL   Hemoglobin 11.1 (L) 12.0 - 15.0 g/dL   HCT 33.2 (L) 36.0 - 46.0 %   MCV 81.8 78.0 - 100.0 fL   MCH 27.3 26.0 - 34.0 pg   MCHC 33.4 30.0 - 36.0 g/dL   RDW 14.6 11.5 - 15.5 %   Platelets 182 150 - 400 K/uL  hCG, quantitative, pregnancy     Status: Abnormal   Collection Time: 10/07/17  7:07 PM  Result Value Ref Range   hCG, Beta Chain, Quant, S 54,018 (H) <5 mIU/mL   US Ob Less Than 14 Weeks With Ob  Transvaginal  Result Date: 10/07/2017 CLINICAL DATA:  Pelvic pain EXAM: OBSTETRIC <14 WK [place] AND TRANSVAGINAL OB US TECHNIQUE: Both transabdominal and transvaginal ultrasound examinations were performed for complete evaluation of the gestation as well as the maternal uterus, adnexal regions, and pelvic cul-de-sac. Transvaginal technique was performed to assess early pregnancy. COMPARISON:  None. FINDINGS: Intrauterine gestational sac: Visualized Yolk sac:  Visualized Embryo:  Visualized Cardiac Activity: Visualized Heart Rate: 111 bpm CRL:  5 mm   6 w   1 d                  [place] EDC: June 01, 2018 Subchorionic hemorrhage:  None visualized. Maternal uterus/adnexae: Cervical os closed. There is a corpus luteum in the left ovary measuring 2.2 x 1.6 x 1.6 cm. Ovaries otherwise appear normal bilaterally. A small amount of free pelvic fluid is noted. IMPRESSION: Single live intrauterine gestation with estimated gestational age of approximately 57 weeks. No subchorionic hemorrhage evident. Small amount of free pelvic fluid.  Study otherwise unremarkable. Electronically Signed   By: Lowella Grip III M.D.   On: 10/07/2017 19:31    MDM +UPT UA, wet prep, GC/chlamydia, CBC, ABO/Rh, quant hCG, HIV, and [place] today to rule out ectopic pregnancy B positive Ultrasound shows SIUP with cardiac activity c/w dating by LMP  Otherwise exam unremarkable. Discussed with patient tx of current  symptoms including nausea, constipation, & URI.  Plans on going to  [place] for prenatal care and will call them to schedule new OB.   Assessment and Plan  A; 1. Normal IUP (intrauterine pregnancy) on prenatal ultrasound, first trimester   2. Abdominal pain during pregnancy in first trimester   3. Nausea and vomiting during pregnancy prior to [redacted] weeks gestation   4. Constipation during pregnancy in first trimester   5. Common cold    P: Discharge home Rx phenergan & colace OTC meds in pregnancy list given Start prenatal care Discussed reasons to return to MAU --- if sinus symptoms don't improve in 2 weeks with symptomatic tx may consider abx Education given on tx of constipation  Jorje Guild 10/07/2017, 7:33 PM

## 2017-10-07 NOTE — MAU Note (Signed)
Pt states she has been having lower abd pain for the last 3 days. LMP 08/26/2017, positive home preg test. Also reports headache head congestion.

## 2017-10-07 NOTE — Discharge Instructions (Signed)
Safe Medications in Pregnancy   Acne: Benzoyl Peroxide Salicylic Acid  Backache/Headache: Tylenol: 2 regular strength every 4 hours OR              2 Extra strength every 6 hours  Colds/Coughs/Allergies: Benadryl (alcohol free) 25 mg every 6 hours as needed Breath right strips Claritin Cepacol throat lozenges Chloraseptic throat spray Cold-Eeze- up to three times per day Cough drops, alcohol free Flonase (by prescription only) Guaifenesin Mucinex Robitussin DM (plain only, alcohol free) Saline nasal spray/drops Sudafed (pseudoephedrine) & Actifed ** use only after [redacted] weeks gestation and if you do not have high blood pressure Tylenol Vicks Vaporub Zinc lozenges Zyrtec   Constipation: Colace Ducolax suppositories Fleet enema Glycerin suppositories Metamucil Milk of magnesia Miralax Senokot Smooth move tea  Diarrhea: Kaopectate Imodium A-D  *NO pepto Bismol  Hemorrhoids: Anusol Anusol HC Preparation H Tucks  Indigestion: Tums Maalox Mylanta Zantac  Pepcid  Insomnia: Benadryl (alcohol free) 25mg  every 6 hours as needed Tylenol PM Unisom, no Gelcaps  Leg Cramps: Tums MagGel  Nausea/Vomiting:  Bonine Dramamine Emetrol Ginger extract Sea bands Meclizine  Nausea medication to take during pregnancy:  Unisom (doxylamine succinate 25 mg tablets) Take one tablet daily at bedtime. If symptoms are not adequately controlled, the dose can be increased to a maximum recommended dose of two tablets daily (1/2 tablet in the morning, 1/2 tablet mid-afternoon and one at bedtime). Vitamin B6 100mg  tablets. Take one tablet twice a day (up to 200 mg per day).  Skin Rashes: Aveeno products Benadryl cream or 25mg  every 6 hours as needed Calamine Lotion 1% cortisone cream  Yeast infection: Gyne-lotrimin 7 Monistat 7  Gum/tooth pain: Anbesol  **If taking multiple medications, please check labels to avoid duplicating the same active ingredients **take  medication as directed on the label ** Do not exceed 4000 mg of tylenol in 24 hours **Do not take medications that contain aspirin or ibuprofen     Constipation, Adult Constipation is when a person has fewer bowel movements in a week than normal, has difficulty having a bowel movement, or has stools that are dry, hard, or larger than normal. Constipation may be caused by an underlying condition. It may become worse with age if a person takes certain medicines and does not take in enough fluids. Follow these instructions at home: Eating and drinking   Eat foods that have a lot of fiber, such as fresh fruits and vegetables, whole grains, and beans.  Limit foods that are high in fat, low in fiber, or overly processed, such as french fries, hamburgers, cookies, candies, and soda.  Drink enough fluid to keep your urine clear or pale yellow. General instructions  Exercise regularly or as told by your health care provider.  Go to the restroom when you have the urge to go. Do not hold it in.  Take over-the-counter and prescription medicines only as told by your health care provider. These include any fiber supplements.  Practice pelvic floor retraining exercises, such as deep breathing while relaxing the lower abdomen and pelvic floor relaxation during bowel movements.  Watch your condition for any changes.  Keep all follow-up visits as told by your health care provider. This is important. Contact a health care provider if:  You have pain that gets worse.  You have a fever.  You do not have a bowel movement after 4 days.  You vomit.  You are not hungry.  You lose weight.  You are bleeding from the anus.  You have thin, pencil-like stools. Get help right away if:  You have a fever and your symptoms suddenly get worse.  You leak stool or have blood in your stool.  Your abdomen is bloated.  You have severe pain in your abdomen.  You feel dizzy or you faint. This  information is not intended to replace advice given to you by your health care provider. Make sure you discuss any questions you have with your health care provider. Document Released: 03/08/2004 Document Revised: 12/29/2015 Document Reviewed: 11/29/2015 Elsevier Interactive Patient Education  2018 [employer].    Sinusitis, Adult Sinusitis is soreness and inflammation of your sinuses. Sinuses are hollow spaces in the bones around your face. They are located:  Around your eyes.  In the middle of your forehead.  Behind your nose.  In your cheekbones.  Your sinuses and nasal passages are lined with a stringy fluid (mucus). Mucus normally drains out of your sinuses. When your nasal tissues get inflamed or swollen, the mucus can get trapped or blocked so air cannot flow through your sinuses. This lets bacteria, viruses, and funguses grow, and that leads to infection. Follow these instructions at home: Medicines  Take, use, or apply over-the-counter and prescription medicines only as told by your doctor. These may include nasal sprays.  If you were prescribed an antibiotic medicine, take it as told by your doctor. Do not stop taking the antibiotic even if you start to feel better. Hydrate and Humidify  Drink enough water to keep your pee (urine) clear or pale yellow.  Use a cool mist humidifier to keep the humidity level in your home above 50%.  Breathe in steam for 10-15 minutes, 3-4 times a day or as told by your doctor. You can do this in the bathroom while a hot shower is running.  Try not to spend time in cool or dry air. Rest  Rest as much as possible.  Sleep with your head raised (elevated).  Make sure to get enough sleep each night. General instructions  Put a warm, moist washcloth on your face 3-4 times a day or as told by your doctor. This will help with discomfort.  Wash your hands often with soap and water. If there is no soap and water, use hand sanitizer.  Do  not smoke. Avoid being around people who are smoking (secondhand smoke).  Keep all follow-up visits as told by your doctor. This is important. Contact a doctor if:  You have a fever.  Your symptoms get worse.  Your symptoms do not get better within 10 days. Get help right away if:  You have a very bad headache.  You cannot stop throwing up (vomiting).  You have pain or swelling around your face or eyes.  You have trouble seeing.  You feel confused.  Your neck is stiff.  You have trouble breathing. This information is not intended to replace advice given to you by your health care provider. Make sure you discuss any questions you have with your health care provider. Document Released: 11/27/2007 Document Revised: 02/04/2016 Document Reviewed: 04/05/2015 Elsevier Interactive Patient Education  [employer].

## 2017-10-16 DIAGNOSIS — J019 Acute sinusitis, unspecified: Secondary | ICD-10-CM | POA: Diagnosis not present

## 2017-10-16 DIAGNOSIS — G43719 Chronic migraine without aura, intractable, without status migrainosus: Secondary | ICD-10-CM | POA: Diagnosis not present

## 2017-11-18 ENCOUNTER — Encounter: Payer: Self-pay | Admitting: Student

## 2017-11-18 DIAGNOSIS — D573 Sickle-cell trait: Secondary | ICD-10-CM | POA: Insufficient documentation

## 2017-11-19 ENCOUNTER — Ambulatory Visit (INDEPENDENT_AMBULATORY_CARE_PROVIDER_SITE_OTHER): Payer: 59 | Admitting: Student

## 2017-11-19 ENCOUNTER — Encounter: Payer: Self-pay | Admitting: Student

## 2017-11-19 VITALS — BP 113/65 | HR 85 | Wt 181.7 lb

## 2017-11-19 DIAGNOSIS — Z3481 Encounter for supervision of other normal pregnancy, first trimester: Secondary | ICD-10-CM | POA: Diagnosis not present

## 2017-11-19 DIAGNOSIS — Z124 Encounter for screening for malignant neoplasm of cervix: Secondary | ICD-10-CM | POA: Diagnosis not present

## 2017-11-19 DIAGNOSIS — Z1151 Encounter for screening for human papillomavirus (HPV): Secondary | ICD-10-CM

## 2017-11-19 DIAGNOSIS — Z3480 Encounter for supervision of other normal pregnancy, unspecified trimester: Secondary | ICD-10-CM

## 2017-11-19 DIAGNOSIS — O99011 Anemia complicating pregnancy, first trimester: Secondary | ICD-10-CM

## 2017-11-19 DIAGNOSIS — Z113 Encounter for screening for infections with a predominantly sexual mode of transmission: Secondary | ICD-10-CM

## 2017-11-19 DIAGNOSIS — R51 Headache: Secondary | ICD-10-CM

## 2017-11-19 DIAGNOSIS — D573 Sickle-cell trait: Secondary | ICD-10-CM

## 2017-11-19 DIAGNOSIS — O26891 Other specified pregnancy related conditions, first trimester: Secondary | ICD-10-CM

## 2017-11-19 LAB — POCT URINALYSIS DIP (DEVICE)
BILIRUBIN URINE: NEGATIVE
Glucose, UA: NEGATIVE mg/dL
Hgb urine dipstick: NEGATIVE
Ketones, ur: NEGATIVE mg/dL
LEUKOCYTES UA: NEGATIVE
Nitrite: NEGATIVE
Protein, ur: NEGATIVE mg/dL
Urobilinogen, UA: 0.2 mg/dL (ref 0.0–1.0)
pH: 6.5 (ref 5.0–8.0)

## 2017-11-19 MED ORDER — CYCLOBENZAPRINE HCL 5 MG PO TABS: 5.0000 mg | ORAL_TABLET | Freq: Three times a day (TID) | ORAL | 0 refills | Status: DC | PRN

## 2017-11-19 NOTE — Progress Notes (Signed)
Subjective:   Megan Coleman is a 32 y.o. G3P2002 at [redacted]w[redacted]d by LMP being seen today for her first obstetrical visit.  Her obstetrical history is significant for sickle cell trait, hx c/section, & hx of myomectomy. Pregnancy history fully reviewed.  Patient has sickle cell trait. FOB has been tested and does not have the trait or disease. Has a history of 1 TSVD followed by a term c/section due to history of myomectomy. Understands that she will need a repeat cesarean.  Reports daily nausea and vomiting. Vomited 1-2 times per day. Is taking antiemetic that has been helping. Also reports headaches; occur >4 times per week. Has been taking tylenol & fioricet without relief. Requesting appointment with headache clinic.  LLQ pain. States she had the same pain with her last pregnancy. Pain is sharp and constant. States she knows the pain will not improve and she expects it d/t hx of surgeries.  Had episode of bright red bleeding on Sunday. Did not seek treatment. Bleeding has since resolved.   Patient reports nausea, vomiting and headaches.  HISTORY: OB History  Gravida Para Term Preterm AB Living  3 2 2  0 0 2  SAB TAB Ectopic Multiple Live Births  0 0 0 0 2    # Outcome Date GA Lbr Len/2nd Weight Sex Delivery Anes PTL Lv  3 Current           2 Term 10/01/13 [redacted]w[redacted]d  7 lb 0.4 oz (3.185 kg) F CS-LTranv Spinal  LIV     Name: TATIONA, STECH     Apgar1: 8  Apgar5: 9  1 Term 2003    M Vag-Spont  N LIV   Past Medical History:  Diagnosis Date  . Abdominal wall mass of left lower quadrant, possible endometrioma 07/31/2011  . Abscess    lower left abdomen/groin area  . Endometriosis of abdominal wall s/p excision [id number] 07/31/2011  . Generalized headaches   . Lymph nodes enlarged   . Sickle cell trait [facility])    Past Surgical History:  Procedure Laterality Date  . APPENDECTOMY  04/02/2009   In [place]  . CESAREAN SECTION N/A 10/01/2013   Procedure: CESAREAN SECTION;  Surgeon:  Truett Mainland, DO;  Location: [facility];  Service: Obstetrics;  Laterality: N/A;  . LAPAROSCOPY  08/28/2011   Procedure: LAPAROSCOPY DIAGNOSTIC;  Surgeon: Adin Hector, MD;  Location: [facility];  Service: General;  Laterality: N/A;  Diagnostic Laparoscopy with Removal of Abdominal Wall Mass  Open Ventral Wall Hernia Repair with Mesh   . MASS EXCISION  08/28/2011   Procedure: EXCISION MASS;  Surgeon: Adin Hector, MD;  Location: [facility];  Service: General;  Laterality: N/A;  Removal of Mass Abdominal Wall   . MYOMECTOMY  07/03/2006   In [place]  . VENTRAL HERNIA REPAIR  08/28/2011   Procedure: HERNIA REPAIR VENTRAL ADULT;  Surgeon: Adin Hector, MD;  Location: [facility];  Service: General;  Laterality: N/A;   Open Ventral Wall Hernia with Mesh    History reviewed. No pertinent family history. Social History   Tobacco Use  . Smoking status: Never Smoker  . Smokeless tobacco: Never Used  Substance Use Topics  . Alcohol use: No  . Drug use: No   No Known Allergies Current Outpatient Medications on File Prior to Visit  Medication Sig Dispense Refill  . butalbital-acetaminophen-caffeine (FIORICET, ESGIC) 50-325-40 MG tablet Take 1 tablet by mouth 2 (two) times daily.    Marland Kitchen  prenatal vitamin w/FE, FA (PRENATAL 1 + 1) 27-1 MG TABS tablet Take 1 tablet by mouth daily at 12 noon.    Marland Kitchen amitriptyline (ELAVIL) 10 MG tablet Take 10 mg by mouth at bedtime.  5  . docusate sodium (COLACE) 100 MG capsule Take 1 capsule (100 mg total) by mouth 2 (two) times daily. (Patient not taking: Reported on 11/19/2017) 60 capsule 0  . ibuprofen (ADVIL,MOTRIN) 200 MG tablet Take 200-400 mg by mouth every 6 (six) hours as needed for moderate pain.    . promethazine (PHENERGAN) 25 MG tablet Take 1 tablet (25 mg total) by mouth every 6 (six) hours as needed for nausea or vomiting. (Patient not taking: Reported on 11/19/2017) 30 tablet 0   No current facility-administered medications on file prior to visit.    Exam    Vitals:   11/19/17 0831  BP: 113/65  Pulse: 85  Weight: 181 lb 11.2 oz (82.4 kg)   Fetal Heart Rate (bpm): 164  Uterus:     Pelvic Exam: Perineum: no hemorrhoids, normal perineum   Vulva: normal external genitalia, no lesions   Vagina:  normal mucosa, normal discharge. No blood.    Cervix: no lesions and normal, pap smear done.   System: General: well-developed, well-nourished female in no acute distress   Breast:  normal appearance, no masses or tenderness   Skin: normal coloration and turgor, no rashes   Neurologic: oriented, normal, negative, normal mood   Extremities: normal strength, tone, and muscle mass, ROM of all joints is normal   HEENT sclera clear, anicteric   Mouth/Teeth mucous membranes moist, pharynx normal without lesions and dental hygiene good   Neck supple and no masses   Cardiovascular: regular rate and rhythm   Respiratory:  no respiratory distress, normal breath sounds   Abdomen: soft, TTP in LLQ; bowel sounds normal; no masses,  no organomegaly     Assessment:   Pregnancy: [id number] Patient Active Problem List   Diagnosis Date Noted  . Supervision of other normal pregnancy, antepartum 11/19/2017  . Sickle cell trait ([place]) 11/18/2017  . Thrombocytopenia ([place]) 10/29/2013  . S/P C-section 10/01/2013  . H/O myomectomy 09/23/2013  . Endometriosis of abdominal wall s/p excision [id number] 07/31/2011     Plan:  1. Supervision of other normal pregnancy, antepartum  - Obstetric Panel, Including HIV - SMN1 COPY NUMBER ANALYSIS (SMA Carrier Screen) - Cystic fibrosis gene test - Cytology - PAP - Culture, OB Urine - POCT urinalysis dip (device) - prenatal vitamin w/FE, FA (PRENATAL 1 + 1) 27-1 MG TABS tablet; Take 1 tablet by mouth daily at 12 noon. - Hemoglobin A1c  2. Sickle cell trait ([place]) -FOB previously tested and negative - Culture, OB Urine  3. Headache in pregnancy, antepartum, first trimester -d/c fioricet -Msg to [facility] for appt with  [facility]/Head[facility] clinic - cyclobenzaprine (FLEXERIL) 5 MG tablet; Take 1 tablet (5 mg total) by mouth 3 (three) times daily as needed for muscle spasms.  Dispense: 20 tablet; Refill: 0    Initial labs drawn. Continue prenatal vitamins. Genetic Screening discussed, NIPS: ordered. Ultrasound discussed; fetal anatomic survey: will order at next visit. Problem list reviewed and updated. The nature of [place] with multiple MDs and other Advanced Practice Providers was explained to patient; also emphasized that residents, students are part of our team. Routine obstetric precautions reviewed. Return in about 1 month (around 12/17/2017) for Routine OB.   Jorje Guild 10:34 AM 11/19/17

## 2017-11-19 NOTE — Progress Notes (Signed)
Pt states on Sunday she was bleeding heavy for 2 to 3 hrs & then it stopped. Megan Coleman snot happened since.

## 2017-11-19 NOTE — Patient Instructions (Addendum)
 Contraception Choices Contraception, also called birth control, refers to methods or devices that prevent pregnancy. Hormonal methods Contraceptive implant A contraceptive implant is a thin, plastic tube that contains a hormone. It is inserted into the upper part of the arm. It can remain in place for up to 3 years. Progestin-only injections Progestin-only injections are injections of progestin, a synthetic form of the hormone progesterone. They are given every 3 months by a health care provider. Birth control pills Birth control pills are pills that contain hormones that prevent pregnancy. They must be taken once a day, preferably at the same time each day. Birth control patch The birth control patch contains hormones that prevent pregnancy. It is placed on the skin and must be changed once a week for three weeks and removed on the fourth week. A prescription is needed to use this method of contraception. Vaginal ring A vaginal ring contains hormones that prevent pregnancy. It is placed in the vagina for three weeks and removed on the fourth week. After that, the process is repeated with a new ring. A prescription is needed to use this method of contraception. Emergency contraceptive Emergency contraceptives prevent pregnancy after unprotected sex. They come in pill form and can be taken up to 5 days after sex. They work best the sooner they are taken after having sex. Most emergency contraceptives are available without a prescription. This method should not be used as your only form of birth control. Barrier methods Female condom A female condom is a thin sheath that is worn over the penis during sex. Condoms keep sperm from going inside a woman's body. They can be used with a spermicide to increase their effectiveness. They should be disposed after a single use. Female condom A female condom is a soft, loose-fitting sheath that is put into the vagina before sex. The condom keeps sperm from  going inside a woman's body. They should be disposed after a single use. Diaphragm A diaphragm is a soft, dome-shaped barrier. It is inserted into the vagina before sex, along with a spermicide. The diaphragm blocks sperm from entering the uterus, and the spermicide kills sperm. A diaphragm should be left in the vagina for 6-8 hours after sex and removed within 24 hours. A diaphragm is prescribed and fitted by a health care provider. A diaphragm should be replaced every 1-2 years, after giving birth, after gaining more than 15 lb (6.8 kg), and after pelvic surgery. Cervical cap A cervical cap is a round, soft latex or plastic cup that fits over the cervix. It is inserted into the vagina before sex, along with spermicide. It blocks sperm from entering the uterus. The cap should be left in place for 6-8 hours after sex and removed within 48 hours. A cervical cap must be prescribed and fitted by a health care provider. It should be replaced every 2 years. Sponge A sponge is a soft, circular piece of polyurethane foam with spermicide on it. The sponge helps block sperm from entering the uterus, and the spermicide kills sperm. To use it, you make it wet and then insert it into the vagina. It should be inserted before sex, left in for at least 6 hours after sex, and removed and thrown away within 30 hours. Spermicides Spermicides are chemicals that kill or block sperm from entering the cervix and uterus. They can come as a cream, jelly, suppository, foam, or tablet. A spermicide should be inserted into the vagina with an applicator at least 10-15 minutes   sex to allow time for it to work. The process must be repeated every time you have sex. Spermicides do not require a prescription. Intrauterine contraception Intrauterine device (IUD) An IUD is a T-shaped device that is put in a woman's uterus. There are two types:  Hormone IUD.This type contains progestin, a synthetic form of the hormone progesterone. This  type can stay in place for 3-5 years.  Copper IUD.This type is wrapped in copper wire. It can stay in place for 10 years.  Permanent methods of contraception Female tubal ligation In this method, a woman's fallopian tubes are sealed, tied, or blocked during surgery to prevent eggs from traveling to the uterus. Hysteroscopic sterilization In this method, a small, flexible insert is placed into each fallopian tube. The inserts cause scar tissue to form in the fallopian tubes and block them, so sperm cannot reach an egg. The procedure takes about 3 months to be effective. Another form of birth control must be used during those 3 months. Female sterilization This is a procedure to tie off the tubes that carry sperm (vasectomy). After the procedure, the man can still ejaculate fluid (semen). Natural planning methods Natural family planning In this method, a couple does not have sex on days when the woman could become pregnant. Calendar method This means keeping track of the length of each menstrual cycle, identifying the days when pregnancy can happen, and not having sex on those days. Ovulation method In this method, a couple avoids sex during ovulation. Symptothermal method This method involves not having sex during ovulation. The woman typically checks for ovulation by watching changes in her temperature and in the consistency of cervical mucus. Post-ovulation method In this method, a couple waits to have sex until after ovulation. Summary  Contraception, also called birth control, means methods or devices that prevent pregnancy.  Hormonal methods of contraception include implants, injections, pills, patches, vaginal rings, and emergency contraceptives.  Barrier methods of contraception can include female condoms, female condoms, diaphragms, cervical caps, sponges, and spermicides.  There are two types of IUDs (intrauterine devices). An IUD can be put in a woman's uterus to prevent pregnancy  for 3-5 years.  Permanent sterilization can be done through a procedure for males, females, or both.  Natural family planning methods involve not having sex on days when the woman could become pregnant. This information is not intended to replace advice given to you by your health care provider. Make sure you discuss any questions you have with your health care provider. Document Released: 06/10/2005 Document Revised: 07/13/2016 Document Reviewed: 07/13/2016 Elsevier Interactive Patient Education  2018 [employer].  How a Baby Grows During Pregnancy Pregnancy begins when a female's sperm enters a female's egg (fertilization). This happens in one of the tubes (fallopian tubes) that connect the ovaries to the womb (uterus). The fertilized egg is called an embryo until it reaches 10 weeks. From 10 weeks until birth, it is called a fetus. The fertilized egg moves down the fallopian tube to the uterus. Then it implants into the lining of the uterus and begins to grow. The developing fetus receives oxygen and nutrients through the pregnant woman's bloodstream and the tissues that grow (placenta) to support the fetus. The placenta is the life support system for the fetus. It provides nutrition and removes waste. Learning as much as you can about your pregnancy and how your baby is developing can help you enjoy the experience. It can also make you aware of when there might be  a problem and when to ask questions. How long does a typical pregnancy last? A pregnancy usually lasts 280 days, or about 40 weeks. Pregnancy is divided into three trimesters:  First trimester: 0-13 weeks.  Second trimester: 14-27 weeks.  Third trimester: 28-40 weeks.  The day when your baby is considered ready to be born (full term) is your estimated date of delivery. How does my baby develop month by month? First month  The fertilized egg attaches to the inside of the uterus.  Some cells will form the placenta. Others will  form the fetus.  The arms, legs, brain, spinal cord, lungs, and heart begin to develop.  At the end of the first month, the heart begins to beat.  Second month  The bones, inner ear, eyelids, hands, and feet form.  The genitals develop.  By the end of 8 weeks, all major organs are developing.  Third month  All of the internal organs are forming.  Teeth develop below the gums.  Bones and muscles begin to grow. The spine can flex.  The skin is transparent.  Fingernails and toenails begin to form.  Arms and legs continue to grow longer, and hands and feet develop.  The fetus is about 3 in (7.6 cm) long.  Fourth month  The placenta is completely formed.  The external sex organs, neck, outer ear, eyebrows, eyelids, and fingernails are formed.  The fetus can hear, swallow, and move its arms and legs.  The kidneys begin to produce urine.  The skin is covered with a white waxy coating (vernix) and very fine hair (lanugo).  Fifth month  The fetus moves around more and can be felt for the first time (quickening).  The fetus starts to sleep and wake up and may begin to suck its finger.  The nails grow to the end of the fingers.  The organ in the digestive system that makes bile (gallbladder) functions and helps to digest the nutrients.  If your baby is a girl, eggs are present in her ovaries. If your baby is a boy, testicles start to move down into his scrotum.  Sixth month  The lungs are formed, but the fetus is not yet able to breathe.  The eyes open. The brain continues to develop.  Your baby has fingerprints and toe prints. Your baby's hair grows thicker.  At the end of the second trimester, the fetus is about 9 in (22.9 cm) long.  Seventh month  The fetus kicks and stretches.  The eyes are developed enough to sense changes in light.  The hands can make a grasping motion.  The fetus responds to sound.  Eighth month  All organs and body systems are  fully developed and functioning.  Bones harden and taste buds develop. The fetus may hiccup.  Certain areas of the brain are still developing. The skull remains soft.  Ninth month  The fetus gains about  lb (0.23 kg) each week.  The lungs are fully developed.  Patterns of sleep develop.  The fetus's head typically moves into a head-down position (vertex) in the uterus to prepare for birth. If the buttocks move into a vertex position instead, the baby is breech.  The fetus weighs 6-9 lbs (2.72-4.08 kg) and is 19-20 in (48.26-50.8 cm) long.  What can I do to have a healthy pregnancy and help my baby develop? Eating and Drinking  Eat a healthy diet. ? Talk with your health care provider to make sure that you are  getting the nutrients that you and your baby need. ? Visit www.BuildDNA.es to learn about creating a healthy diet.  Gain a healthy amount of weight during pregnancy as advised by your health care provider. This is usually 25-35 pounds. You may need to: ? Gain more if you were underweight before getting pregnant or if you are pregnant with more than one baby. ? Gain less if you were overweight or obese when you got pregnant.  Medicines and Vitamins  Take prenatal vitamins as directed by your health care provider. These include vitamins such as folic acid, iron, calcium, and vitamin D. They are important for healthy development.  Take medicines only as directed by your health care provider. Read labels and ask a pharmacist or your health care provider whether over-the-counter medicines, supplements, and prescription drugs are safe to take during pregnancy.  Activities  Be physically active as advised by your health care provider. Ask your health care provider to recommend activities that are safe for you to do, such as walking or swimming.  Do not participate in strenuous or extreme sports.  Lifestyle  Do not drink alcohol.  Do not use any tobacco products,  including cigarettes, chewing tobacco, or electronic cigarettes. If you need help quitting, ask your health care provider.  Do not use illegal drugs.  Safety  Avoid exposure to mercury, lead, or other heavy metals. Ask your health care provider about common sources of these heavy metals.  Avoid listeria infection during pregnancy. Follow these precautions: ? Do not eat soft cheeses or deli meats. ? Do not eat hot dogs unless they have been warmed up to the point of steaming, such as in the microwave oven. ? Do not drink unpasteurized milk.  Avoid toxoplasmosis infection during pregnancy. Follow these precautions: ? Do not change your cat's litter box, if you have a cat. Ask someone else to do this for you. ? Wear gardening gloves while working in the yard.  General Instructions  Keep all follow-up visits as directed by your health care provider. This is important. This includes prenatal care and screening tests.  Manage any chronic health conditions. Work closely with your health care provider to keep conditions, such as diabetes, under control.  How do I know if my baby is developing well? At each prenatal visit, your health care provider will do several different tests to check on your health and keep track of your baby's development. These include:  Fundal height. ? Your health care provider will measure your growing belly from top to bottom using a tape measure. ? Your health care provider will also feel your belly to determine your baby's position.  Heartbeat. ? An ultrasound in the first trimester can confirm pregnancy and show a heartbeat, depending on how far along you are. ? Your health care provider will check your baby's heart rate at every prenatal visit. ? As you get closer to your delivery date, you may have regular fetal heart rate monitoring to make sure that your baby is not in distress.  Second trimester ultrasound. ? This ultrasound checks your baby's  development. It also indicates your baby's gender.  What should I do if I have concerns about my baby's development? Always talk with your health care provider about any concerns that you may have. This information is not intended to replace advice given to you by your health care provider. Make sure you discuss any questions you have with your health care provider. Document Released: 11/27/2007 Document Revised: 11/16/2015  Document Reviewed: 11/17/2013 Elsevier Interactive Patient Education  [employer].

## 2017-11-21 ENCOUNTER — Telehealth: Payer: Self-pay | Admitting: Student

## 2017-11-21 LAB — CYTOLOGY - PAP
Chlamydia: NEGATIVE
DIAGNOSIS: NEGATIVE
HPV: NOT DETECTED
Neisseria Gonorrhea: NEGATIVE
Trichomonas: NEGATIVE

## 2017-11-21 NOTE — Telephone Encounter (Signed)
Verified by name and DOB. Notified of pap smear -- NIL. Swab shows positive for BV. Verified pharmacy and allergies. Flagyl sent to pharmacy.   Jorje Guild, NP

## 2017-11-22 ENCOUNTER — Encounter: Payer: Self-pay | Admitting: Student

## 2017-11-23 LAB — URINE CULTURE, OB REFLEX

## 2017-11-23 LAB — CULTURE, OB URINE

## 2017-11-24 ENCOUNTER — Encounter: Payer: Self-pay | Admitting: Student

## 2017-11-25 ENCOUNTER — Other Ambulatory Visit: Payer: Self-pay | Admitting: General Practice

## 2017-11-25 DIAGNOSIS — B9689 Other specified bacterial agents as the cause of diseases classified elsewhere: Secondary | ICD-10-CM

## 2017-11-25 DIAGNOSIS — N760 Acute vaginitis: Principal | ICD-10-CM

## 2017-11-25 MED ORDER — METRONIDAZOLE 500 MG PO TABS: 500.0000 mg | ORAL_TABLET | Freq: Two times a day (BID) | ORAL | 0 refills | Status: DC

## 2017-11-26 ENCOUNTER — Encounter: Payer: Self-pay | Admitting: *Deleted

## 2017-11-26 LAB — SMN1 COPY NUMBER ANALYSIS (SMA CARRIER SCREENING)

## 2017-11-26 LAB — OBSTETRIC PANEL, INCLUDING HIV
ANTIBODY SCREEN: NEGATIVE
BASOS ABS: 0 10*3/uL (ref 0.0–0.2)
BASOS: 0 %
EOS (ABSOLUTE): 0.1 10*3/uL (ref 0.0–0.4)
Eos: 2 %
HEMATOCRIT: 35.4 % (ref 34.0–46.6)
HIV SCREEN 4TH GENERATION: NONREACTIVE
Hemoglobin: 11.4 g/dL (ref 11.1–15.9)
Hepatitis B Surface Ag: NEGATIVE
IMMATURE GRANS (ABS): 0 10*3/uL (ref 0.0–0.1)
Immature Granulocytes: 0 %
LYMPHS: 32 %
Lymphocytes Absolute: 1.4 10*3/uL (ref 0.7–3.1)
MCH: 27.5 pg (ref 26.6–33.0)
MCHC: 32.2 g/dL (ref 31.5–35.7)
MCV: 86 fL (ref 79–97)
MONOS ABS: 0.3 10*3/uL (ref 0.1–0.9)
Monocytes: 8 %
Neutrophils Absolute: 2.5 10*3/uL (ref 1.4–7.0)
Neutrophils: 58 %
Platelets: 212 10*3/uL (ref 150–450)
RBC: 4.14 x10E6/uL (ref 3.77–5.28)
RDW: 14.4 % (ref 12.3–15.4)
RPR Ser Ql: NONREACTIVE
Rh Factor: POSITIVE
Rubella Antibodies, IGG: 29.7 index (ref >0.99)
WBC: 4.2 10*3/uL (ref 3.4–10.8)

## 2017-11-26 LAB — HEMOGLOBIN A1C
Est. average glucose Bld gHb Est-mCnc: 114 mg/dL
Hgb A1c MFr Bld: 5.6 % (ref 4.8–5.6)

## 2017-11-26 LAB — CYSTIC FIBROSIS GENE TEST

## 2017-11-28 ENCOUNTER — Encounter: Payer: Self-pay | Admitting: *Deleted

## 2017-11-30 ENCOUNTER — Other Ambulatory Visit: Payer: Self-pay | Admitting: Student

## 2017-11-30 DIAGNOSIS — Z3480 Encounter for supervision of other normal pregnancy, unspecified trimester: Secondary | ICD-10-CM

## 2017-11-30 DIAGNOSIS — O26891 Other specified pregnancy related conditions, first trimester: Secondary | ICD-10-CM

## 2017-11-30 DIAGNOSIS — R51 Headache: Principal | ICD-10-CM

## 2017-12-06 ENCOUNTER — Encounter: Payer: Self-pay | Admitting: Student

## 2017-12-06 ENCOUNTER — Other Ambulatory Visit: Payer: Self-pay | Admitting: Student

## 2017-12-06 DIAGNOSIS — Z3480 Encounter for supervision of other normal pregnancy, unspecified trimester: Secondary | ICD-10-CM

## 2017-12-06 DIAGNOSIS — R51 Headache: Principal | ICD-10-CM

## 2017-12-06 DIAGNOSIS — O26891 Other specified pregnancy related conditions, first trimester: Secondary | ICD-10-CM

## 2017-12-06 MED ORDER — CYCLOBENZAPRINE HCL 5 MG PO TABS: 5.0000 mg | ORAL_TABLET | Freq: Three times a day (TID) | ORAL | 0 refills | Status: DC | PRN

## 2017-12-17 ENCOUNTER — Ambulatory Visit (INDEPENDENT_AMBULATORY_CARE_PROVIDER_SITE_OTHER): Payer: 59 | Admitting: Student

## 2017-12-17 VITALS — BP 116/67 | HR 87 | Wt 189.6 lb

## 2017-12-17 DIAGNOSIS — O26899 Other specified pregnancy related conditions, unspecified trimester: Secondary | ICD-10-CM

## 2017-12-17 DIAGNOSIS — Z3480 Encounter for supervision of other normal pregnancy, unspecified trimester: Secondary | ICD-10-CM

## 2017-12-17 DIAGNOSIS — O26891 Other specified pregnancy related conditions, first trimester: Secondary | ICD-10-CM

## 2017-12-17 DIAGNOSIS — R102 Pelvic and perineal pain: Secondary | ICD-10-CM

## 2017-12-17 DIAGNOSIS — R51 Headache: Secondary | ICD-10-CM

## 2017-12-17 MED ORDER — CYCLOBENZAPRINE HCL 5 MG PO TABS: 5.0000 mg | ORAL_TABLET | Freq: Three times a day (TID) | ORAL | 0 refills | Status: DC | PRN

## 2017-12-17 NOTE — Patient Instructions (Signed)
Second Trimester of Pregnancy The second trimester is from week 13 through week 28, month 4 through 6. This is often the time in pregnancy that you feel your best. Often times, morning sickness has lessened or quit. You may have more energy, and you may get hungry more often. Your unborn baby (fetus) is growing rapidly. At the end of the sixth month, he or she is about 9 inches long and weighs about 1 pounds. You will likely feel the baby move (quickening) between 18 and 20 weeks of pregnancy.  Research childbirth classes and hospital preregistration at [email or web address]  Follow these instructions at home:  Avoid all smoking, herbs, and alcohol. Avoid drugs not approved by your doctor.  Do not use any tobacco products, including cigarettes, chewing tobacco, and electronic cigarettes. If you need help quitting, ask your doctor. You may get counseling or other support to help you quit.  Only take medicine as told by your doctor. Some medicines are safe and some are not during pregnancy.  Exercise only as told by your doctor. Stop exercising if you start having cramps.  Eat regular, healthy meals.  Wear a good support bra if your breasts are tender.  Do not use hot tubs, steam rooms, or saunas.  Wear your seat belt when driving.  Avoid raw meat, uncooked cheese, and liter boxes and soil used by cats.  Take your prenatal vitamins.  Take 1500-2000 milligrams of calcium daily starting at the 20th week of pregnancy until you deliver your baby.  Try taking medicine that helps you poop (stool softener) as needed, and if your doctor approves. Eat more fiber by eating fresh fruit, vegetables, and whole grains. Drink enough fluids to keep your pee (urine) clear or pale yellow.  Take warm water baths (sitz baths) to soothe pain or discomfort caused by hemorrhoids. Use hemorrhoid cream if your doctor approves.  If you have puffy, bulging veins (varicose veins), wear support hose. Raise  (elevate) your feet for 15 minutes, 3-4 times a day. Limit salt in your diet.  Avoid heavy lifting, wear low heals, and sit up straight.  Rest with your legs raised if you have leg cramps or low back pain.  Visit your dentist if you have not gone during your pregnancy. Use a soft toothbrush to brush your teeth. Be gentle when you floss.  You can have sex (intercourse) unless your doctor tells you not to.  Go to your doctor visits.  Get help if:  You feel dizzy.  You have mild cramps or pressure in your lower belly (abdomen).  You have a nagging pain in your belly area.  You continue to feel sick to your stomach (nauseous), throw up (vomit), or have watery poop (diarrhea).  You have bad smelling fluid coming from your vagina.  You have pain with peeing (urination). Get help right away if:  You have a fever.  You are leaking fluid from your vagina.  You have spotting or bleeding from your vagina.  You have severe belly cramping or pain.  You lose or gain weight rapidly.  You have trouble catching your breath and have chest pain.  You notice sudden or extreme puffiness (swelling) of your face, hands, ankles, feet, or legs.  You have not felt the baby move in over an hour.  You have severe headaches that do not go away with medicine.  You have vision changes. This information is not intended to replace advice given to you by your health care provider. Make   sure you discuss any questions you have with your health care provider. Document Released: 09/04/2009 Document Revised: 11/16/2015 Document Reviewed: 08/11/2012 Elsevier Interactive Patient Education  2017 Elsevier Inc.    

## 2017-12-17 NOTE — Progress Notes (Signed)
   PRENATAL VISIT NOTE  Subjective:  Megan Coleman is a 32 y.o. G3P2002 at [redacted]w[redacted]d being seen today for ongoing prenatal care.  She is currently monitored for the following issues for this high-risk pregnancy and has Endometriosis of abdominal wall s/p excision [id number]; H/O myomectomy; S/P C-section; Thrombocytopenia ([place]); Sickle cell trait ([place]); and Supervision of other normal pregnancy, antepartum on their problem list.  Patient reports backache and carpal tunnel symptoms. Reports low back pain throughout the day. Also reports continued headaches. Flexeril has been helping for headaches and back. She has an appt with [facility] on 01/02/18.  Contractions: Not present. Vag. Bleeding: None.  Movement: Present. Denies leaking of fluid.   The following portions of the patient's history were reviewed and updated as appropriate: allergies, current medications, past family history, past medical history, past social history, past surgical history and problem list. Problem list updated.  Objective:   Vitals:   12/17/17 1032  BP: 116/67  Pulse: 87  Weight: 189 lb 9.6 oz (86 kg)    Fetal Status: Fetal Heart Rate (bpm): 160   Movement: Present   FH 2 FB below umbilicus  General:  Alert, oriented and cooperative. Patient is in no acute distress.  Skin: Skin is warm and dry. No rash noted.   Cardiovascular: Normal heart rate noted  Respiratory: Normal respiratory effort, no problems with respiration noted  Abdomen: Soft, gravid, appropriate for gestational age.  Pain/Pressure: Present     Pelvic: Cervical exam deferred        Extremities: Normal range of motion.  Edema: Trace  Mental Status: Normal mood and affect. Normal behavior. Normal judgment and thought content.   Assessment and Plan:  Pregnancy: G3P2002 at [redacted]w[redacted]d  1. Supervision of other normal pregnancy, antepartum -reviewed new OB labs.  -Discussed AFP. Pt difficult stick. Defers at this time.  - [place] MFM OB COMP + 14 WK;  Future  2. Pain of round ligament affecting pregnancy, antepartum   3. Headache in pregnancy, antepartum, first trimester  - cyclobenzaprine (FLEXERIL) 5 MG tablet; Take 1 tablet (5 mg total) by mouth 3 (three) times daily as needed for muscle spasms.  Dispense: 20 tablet; Refill: 0  Preterm labor symptoms and general obstetric precautions including but not limited to vaginal bleeding, contractions, leaking of fluid and fetal movement were reviewed in detail with the patient. Please refer to After Visit Summary for other counseling recommendations.  Return in about 1 month (around 01/14/2018) for Routine OB.  Future Appointments  Date Time Provider [place]  01/02/2018 11:00 AM [place] Darrol Angel [facility]  01/07/2018 11:00 AM [place] 3 [facility]  01/16/2018  1:15 PM Ardean Larsen, Mervyn Skeeters, CNM [facility]    Jorje Guild, NP

## 2017-12-31 ENCOUNTER — Encounter (HOSPITAL_COMMUNITY): Payer: Self-pay

## 2018-01-02 ENCOUNTER — Ambulatory Visit (INDEPENDENT_AMBULATORY_CARE_PROVIDER_SITE_OTHER): Payer: 59 | Admitting: Physician Assistant

## 2018-01-02 ENCOUNTER — Encounter: Payer: Self-pay | Admitting: Physician Assistant

## 2018-01-02 VITALS — BP 110/70 | HR 114 | Temp 98.7°F | Ht 67.0 in | Wt 191.4 lb

## 2018-01-02 DIAGNOSIS — S199XXA Unspecified injury of neck, initial encounter: Secondary | ICD-10-CM

## 2018-01-02 DIAGNOSIS — O26892 Other specified pregnancy related conditions, second trimester: Secondary | ICD-10-CM

## 2018-01-02 DIAGNOSIS — R51 Headache: Secondary | ICD-10-CM

## 2018-01-02 DIAGNOSIS — O26899 Other specified pregnancy related conditions, unspecified trimester: Secondary | ICD-10-CM

## 2018-01-02 DIAGNOSIS — S0990XA Unspecified injury of head, initial encounter: Secondary | ICD-10-CM

## 2018-01-02 MED ORDER — CYCLOBENZAPRINE HCL 10 MG PO TABS: 10.0000 mg | ORAL_TABLET | Freq: Three times a day (TID) | ORAL | 1 refills | Status: DC | PRN

## 2018-01-02 MED ORDER — BUTALBITAL-APAP-CAFFEINE 50-325-40 MG PO CAPS: 1.0000 | ORAL_CAPSULE | Freq: Four times a day (QID) | ORAL | 3 refills | Status: DC | PRN

## 2018-01-02 NOTE — Progress Notes (Signed)
History:  Megan Coleman is a 32 y.o. G3P2002  ([redacted] weeks pregnant) who presents to clinic today for new headache evaluation.  She fell in the shower over a year ago, hitting her left side of her head.  Since then, the headache is non-stop.  It is the entire left side of her head, with severe pain with pulsations.  Her left nose/sinuses hurt and the left nose runs non-stop.  There is some nausea but no vomiting, also no photophobia or phonophobia. The pain has gotten worse with the pregnancy.     She has tried medication that helped ease the pain but did not take away the headache.  She was seen by [facility]. Flexeril is somewhat helpful.   Past Medical History:  Diagnosis Date  . Abdominal wall mass of left lower quadrant, possible endometrioma 07/31/2011  . Abscess    lower left abdomen/groin area  . Endometriosis of abdominal wall s/p excision [id number] 07/31/2011  . Generalized headaches   . Lymph nodes enlarged   . Sickle cell trait ([place])     Social History   Socioeconomic History  . Marital status: Single    Spouse name: Not on file  . Number of children: Not on file  . Years of education: Not on file  . Highest education level: Not on file  Occupational History  . Not on file  Social Needs  . Financial resource strain: Not on file  . Food insecurity:    Worry: Not on file    Inability: Not on file  . Transportation needs:    Medical: Not on file    Non-medical: Not on file  Tobacco Use  . Smoking status: Never Smoker  . Smokeless tobacco: Never Used  Substance and Sexual Activity  . Alcohol use: No  . Drug use: No  . Sexual activity: Yes    Birth control/protection: None  Lifestyle  . Physical activity:    Days per week: Not on file    Minutes per session: Not on file  . Stress: Not on file  Relationships  . Social connections:    Talks on phone: Not on file    Gets together: Not on file    Attends religious service: Not on file    Active  member of club or organization: Not on file    Attends meetings of clubs or organizations: Not on file    Relationship status: Not on file  . Intimate partner violence:    Fear of current or ex partner: Not on file    Emotionally abused: Not on file    Physically abused: Not on file    Forced sexual activity: Not on file  Other Topics Concern  . Not on file  Social History Narrative   Originally from [place]    No family history on file.  No Known Allergies  Current Outpatient Medications on File Prior to Visit  Medication Sig Dispense Refill  . cyclobenzaprine (FLEXERIL) 5 MG tablet Take 1 tablet (5 mg total) by mouth 3 (three) times daily as needed for muscle spasms. 20 tablet 0  . prenatal vitamin w/FE, FA (PRENATAL 1 + 1) 27-1 MG TABS tablet Take 1 tablet by mouth daily at 12 noon.     No current facility-administered medications on file prior to visit.      Review of Systems:  All pertinent positive/negative included in HPI, all other review of systems are negative   Objective:  Physical  Exam BP 110/70   Pulse (!) 114   Temp 98.7 F (37.1 C)   Ht 5\' 7"  (1.702 m)   Wt 191 lb 6.4 oz (86.8 kg)   LMP 08/26/2017   BMI 29.98 kg/m  CONSTITUTIONAL: Well-developed, well-nourished female in no acute distress.  EYES: EOM intact ENT: Normocephalic CARDIOVASCULAR: Regular rate RESPIRATORY: Normal rate.   MUSCULOSKELETAL: Normal ROM SKIN: Warm, dry without erythema  NEUROLOGICAL: Alert, oriented, CN II-XII grossly intact, Appropriate balance PSYCH: Normal behavior, mood   Assessment & Plan:  Assessment: 1. Pregnancy headache, antepartum   2. Headache due to injury of head and neck      Plan: Flexeril for acute pain as this has already shown some benefit - may use liberally without worry for rebound HOWEVER sedation precautions should be followed.  Fioricet for acute pain as the flexeril is not always enough.  This can be used when she needs to be  alert Pt advised that other measures are likely to be more helpful when her pregnancy is complete.   Follow-up PRN  Paticia Stack, PA-C 01/02/2018 11:10 AM

## 2018-01-07 ENCOUNTER — Other Ambulatory Visit (HOSPITAL_COMMUNITY): Payer: Self-pay | Admitting: *Deleted

## 2018-01-07 ENCOUNTER — Ambulatory Visit (HOSPITAL_COMMUNITY)
Admission: RE | Admit: 2018-01-07 | Discharge: 2018-01-07 | Disposition: A | Discharge status: Home / Self Care | Payer: 59 | Source: Ambulatory Visit | Attending: Student | Admitting: Student

## 2018-01-07 DIAGNOSIS — O0992 Supervision of high risk pregnancy, unspecified, second trimester: Secondary | ICD-10-CM | POA: Diagnosis not present

## 2018-01-07 DIAGNOSIS — Z363 Encounter for antenatal screening for malformations: Secondary | ICD-10-CM | POA: Insufficient documentation

## 2018-01-07 DIAGNOSIS — Z3A19 19 weeks gestation of pregnancy: Secondary | ICD-10-CM | POA: Diagnosis not present

## 2018-01-07 DIAGNOSIS — Z3689 Encounter for other specified antenatal screening: Secondary | ICD-10-CM | POA: Insufficient documentation

## 2018-01-07 DIAGNOSIS — O34219 Maternal care for unspecified type scar from previous cesarean delivery: Secondary | ICD-10-CM | POA: Insufficient documentation

## 2018-01-07 DIAGNOSIS — Z3480 Encounter for supervision of other normal pregnancy, unspecified trimester: Secondary | ICD-10-CM

## 2018-01-07 DIAGNOSIS — Z362 Encounter for other antenatal screening follow-up: Secondary | ICD-10-CM

## 2018-01-12 ENCOUNTER — Encounter: Payer: Self-pay | Admitting: Physician Assistant

## 2018-01-12 DIAGNOSIS — R519 Headache, unspecified: Secondary | ICD-10-CM | POA: Insufficient documentation

## 2018-01-12 DIAGNOSIS — S0990XA Unspecified injury of head, initial encounter: Secondary | ICD-10-CM | POA: Insufficient documentation

## 2018-01-12 DIAGNOSIS — R51 Headache: Principal | ICD-10-CM

## 2018-01-12 DIAGNOSIS — S199XXA Unspecified injury of neck, initial encounter: Secondary | ICD-10-CM

## 2018-01-12 DIAGNOSIS — O26899 Other specified pregnancy related conditions, unspecified trimester: Secondary | ICD-10-CM | POA: Insufficient documentation

## 2018-01-12 NOTE — Patient Instructions (Signed)

## 2018-01-16 ENCOUNTER — Ambulatory Visit (INDEPENDENT_AMBULATORY_CARE_PROVIDER_SITE_OTHER): Payer: 59 | Admitting: Student

## 2018-01-16 VITALS — BP 128/64 | HR 108 | Wt 196.4 lb

## 2018-01-16 DIAGNOSIS — O26899 Other specified pregnancy related conditions, unspecified trimester: Secondary | ICD-10-CM

## 2018-01-16 DIAGNOSIS — Z3480 Encounter for supervision of other normal pregnancy, unspecified trimester: Secondary | ICD-10-CM

## 2018-01-16 DIAGNOSIS — R51 Headache: Secondary | ICD-10-CM

## 2018-01-16 NOTE — Progress Notes (Addendum)
PRENATAL VISIT NOTE  Subjective:  Megan Coleman is a 32 y.o. G3P2002 at [redacted]w[redacted]d being seen today for ongoing prenatal care.  She is currently monitored for the following issues for this low-risk pregnancy and has Endometriosis of abdominal wall s/p excision [id number]; H/O myomectomy; S/P C-section; Thrombocytopenia ([place]); Sickle cell trait ([place]); Supervision of other normal pregnancy, antepartum; Headache due to injury of head and neck; and Pregnancy headache, antepartum on their problem list.  Patient reports diarrhea. Pt states that she has been having diarrhea for week that is watery but denies any nausea or vomiting or other signs of infection. Contractions: Not present. Vag. Bleeding: None.  Movement: Absent. Denies leaking of fluid.   The following portions of the patient's history were reviewed and updated as appropriate: allergies, current medications, past family history, past medical history, past social history, past surgical history and problem list. Problem list updated.  Objective:   Vitals:   01/16/18 1316  BP: 128/64  Pulse: (!) 108  Weight: 196 lb 6.4 oz (89.1 kg)    Fetal Status: Fetal Heart Rate (bpm): 164   Movement: Absent     General:  Alert, oriented and cooperative. Patient is in no acute distress.  Skin: Skin is warm and dry. No rash noted.   Cardiovascular: Normal heart rate noted  Respiratory: Normal respiratory effort, no problems with respiration noted  Abdomen: Soft, gravid, appropriate for gestational age.  Pain/Pressure: Present     Pelvic: Cervical exam deferred        Extremities: Normal range of motion.  Edema: Trace  Mental Status: Normal mood and affect. Normal behavior. Normal judgment and thought content.   Assessment and Plan:  Pregnancy: G3P2002 at [redacted]w[redacted]d  1. Supervision of other normal pregnancy, antepartum -Reviewed [place] results -discussed BTL as option for birth control, recommended she sign papers if she is considering. Pt still  undecided.   2. Pregnancy Headache, antepartum -Well controlled, recommended she continue taking flexeril for headaches  3. Diarrhea -Does not seem to be from infectious or pregnancy cause, recommended increasing fluid intake to avoid dehydration.  There are no diagnoses linked to this encounter. Preterm labor symptoms and general obstetric precautions including but not limited to vaginal bleeding, contractions, leaking of fluid and fetal movement were reviewed in detail with the patient. Please refer to After Visit Summary for other counseling recommendations.  Return in about 1 month (around 02/13/2018), or LROB.  Future Appointments  Date Time Provider [place]  02/04/2018  9:30 AM [facility] [place] 1 [facility] MFC-US  02/10/2018  3:55 PM [place], Mervyn Skeeters, CNM [facility]    Sherene Sires, Medical Student    CNM attestation:  I have seen and examined this patient and agree with above documentation in the medical student's note.   Megan Coleman is a 32 y.o. G3P2002 at [redacted]w[redacted]d reporting that she is having diarrhea about one to 5 times a day. She has only felt the baby move once in the past month.   Denies LOF, VB, contractions, vaginal discharge.  PE: Gen: calm comfortable, NAD Resp: normal effort, no distress Heart: Regular rate Abd: Soft, NT, gravid, S=D   ROS, labs, PMH reviewed  No orders of the defined types were placed in this encounter.  No orders of the defined types were placed in this encounter.   Assessment: 1. Pregnancy headache, antepartum   2. Supervision of other normal pregnancy, antepartum     Plan: -Reviewed that normal fetal movements don't start until  24 weeks or later; encouraged hydration and limiting foods to BRAT type foods for now -Patient strongly thinking about BTL; explained that she can sign papers by 28 weeks and still consider her options after that - Return in 4 weeks  - Preterm labor precautions. - Follow-up as  scheduled at your doctor's office for next prenatal visit or sooner as needed if symptoms worsen. - Return to maternity admissions symptoms worsen  Megan Coleman, CNM 01/16/2018 1:54 PM

## 2018-01-16 NOTE — Progress Notes (Signed)
Pt claims only felt baby move 1x last week, hasn't moved since

## 2018-01-16 NOTE — Patient Instructions (Signed)
Laparoscopic Tubal Ligation Laparoscopic tubal ligation is a procedure to close the fallopian tubes. This is done so that you cannot get pregnant. When the fallopian tubes are closed, the eggs that your ovaries release cannot enter the uterus, and sperm cannot reach the released eggs. A laparoscopic tubal ligation is sometimes called "getting your tubes tied." You should not have this procedure if you want to get pregnant someday or if you are unsure about having more children. Tell a health care provider about:  Any allergies you have.  All medicines you are taking, including vitamins, herbs, eye drops, creams, and over-the-counter medicines.  Any problems you or family members have had with anesthetic medicines.  Any blood disorders you have.  Any surgeries you have had.  Any medical conditions you have.  Whether you are pregnant or may be pregnant.  Any past pregnancies. What are the risks? Generally, this is a safe procedure. However, problems may occur, including:  Infection.  Bleeding.  Injury to surrounding organs.  Side effects from anesthetics.  Failure of the procedure.  This procedure can increase your risk of a kind of pregnancy in which a fertilized egg attaches to the outside of the uterus (ectopic pregnancy). What happens before the procedure?  Ask your health care provider about: ? Changing or stopping your regular medicines. This is especially important if you are taking diabetes medicines or blood thinners. ? Taking medicines such as aspirin and ibuprofen. These medicines can thin your blood. Do not take these medicines before your procedure if your health care provider instructs you not to.  Follow instructions from your health care provider about eating and drinking restrictions.  Plan to have someone take you home after the procedure.  If you go home right after the procedure, plan to have someone with you for 24 hours. What happens during the  procedure?  You will be given one or more of the following: ? A medicine to help you relax (sedative). ? A medicine to numb the area (local anesthetic). ? A medicine to make you fall asleep (general anesthetic). ? A medicine that is injected into an area of your body to numb everything below the injection site (regional anesthetic).  An IV tube will be inserted into one of your veins. It will be used to give you medicines and fluids during the procedure.  Your bladder may be emptied with a small tube (catheter).  If you have been given a general anesthetic, a tube will be put down your throat to help you breathe.  Two small cuts (incisions) will be made in your lower abdomen and near your belly button.  Your abdomen will be inflated with a gas. This will let the surgeon see better and will give the surgeon room to work.  A thin, lighted tube (laparoscope) with a camera attached will be inserted into your abdomen through one of the incisions. Small instruments will be inserted through the other incision.  The fallopian tubes will be tied off, burned (cauterized), or blocked with a clip, ring, or clamp. A small portion in the center of each fallopian tube may be removed.  The gas will be released from the abdomen.  The incisions will be closed with stitches (sutures).  A bandage (dressing) will be placed over the incisions. The procedure may vary among health care providers and hospitals. What happens after the procedure?  Your blood pressure, heart rate, breathing rate, and blood oxygen level will be monitored often until the medicines you   were given have worn off.  You will be given medicine to help with pain, nausea, and vomiting as needed. This information is not intended to replace advice given to you by your health care provider. Make sure you discuss any questions you have with your health care provider. Document Released: 09/16/2000 Document Revised: 11/16/2015 Document  Reviewed: 05/21/2015 Elsevier Interactive Patient Education  2018 Elsevier Inc.  

## 2018-01-20 ENCOUNTER — Inpatient Hospital Stay (HOSPITAL_COMMUNITY)
Admission: AD | Admit: 2018-01-20 | Discharge: 2018-01-20 | Disposition: A | Discharge status: Home / Self Care | Payer: 59 | Source: Ambulatory Visit | Attending: Family Medicine | Admitting: Family Medicine

## 2018-01-20 ENCOUNTER — Encounter (HOSPITAL_COMMUNITY): Payer: Self-pay

## 2018-01-20 DIAGNOSIS — R1013 Epigastric pain: Secondary | ICD-10-CM | POA: Diagnosis present

## 2018-01-20 DIAGNOSIS — O9962 Diseases of the digestive system complicating childbirth: Secondary | ICD-10-CM | POA: Insufficient documentation

## 2018-01-20 DIAGNOSIS — O26892 Other specified pregnancy related conditions, second trimester: Secondary | ICD-10-CM

## 2018-01-20 DIAGNOSIS — O368130 Decreased fetal movements, third trimester, not applicable or unspecified: Secondary | ICD-10-CM | POA: Diagnosis not present

## 2018-01-20 DIAGNOSIS — Z3A21 21 weeks gestation of pregnancy: Secondary | ICD-10-CM | POA: Insufficient documentation

## 2018-01-20 DIAGNOSIS — R12 Heartburn: Secondary | ICD-10-CM | POA: Diagnosis not present

## 2018-01-20 HISTORY — DX: Benign neoplasm of connective and other soft tissue, unspecified: D21.9

## 2018-01-20 HISTORY — DX: Anemia, unspecified: D64.9

## 2018-01-20 LAB — URINALYSIS, ROUTINE W REFLEX MICROSCOPIC
Bilirubin Urine: NEGATIVE
Glucose, UA: NEGATIVE mg/dL
HGB URINE DIPSTICK: NEGATIVE
Ketones, ur: NEGATIVE mg/dL
Leukocytes, UA: NEGATIVE
Nitrite: NEGATIVE
Protein, ur: NEGATIVE mg/dL
Specific Gravity, Urine: 1.006 (ref 1.005–1.030)
pH: 6 (ref 5.0–8.0)

## 2018-01-20 MED ORDER — GI COCKTAIL ~~LOC~~
30.0000 mL | Freq: Once | ORAL | Status: AC
  Administered 2018-01-20: 30 mL via ORAL
  Filled 2018-01-20: qty 30

## 2018-01-20 MED ORDER — RANITIDINE HCL 150 MG PO TABS: 150.0000 mg | ORAL_TABLET | Freq: Two times a day (BID) | ORAL | 0 refills | Status: DC

## 2018-01-20 NOTE — Discharge Instructions (Signed)
Heartburn During Pregnancy Heartburn is pain or discomfort in the throat or chest. It may cause a burning feeling. It happens when stomach acid moves up into the tube that carries food from your mouth to your stomach (esophagus). Heartburn is common during pregnancy. It usually goes away or gets better after giving birth. Follow these instructions at home: Eating and drinking  Do not drink alcohol while you are pregnant.  Figure out which foods and beverages make you feel worse, and avoid them.  Beverages that you may want to avoid include: ? Coffee and tea (with or without caffeine). ? Energy drinks and sports drinks. ? Bubbly (carbonated) drinks or sodas. ? Citrus fruit juices.  Foods that you may want to avoid include: ? Chocolate and cocoa. ? Peppermint and mint flavorings. ? Garlic, onions, and horseradish. ? Spicy and acidic foods. These include peppers, chili powder, curry powder, vinegar, hot sauces, and barbecue sauce. ? Citrus fruits, such as oranges, lemons, and limes. ? Tomato-based foods, such as red sauce, chili, and salsa. ? Fried and fatty foods, such as donuts, french fries, potato chips, and high-fat dressings. ? High-fat meats, such as hot dogs, cold cuts, sausage, ham, and bacon. ? High-fat dairy items, such as whole milk, butter, and cheese.  Eat small meals often, instead of large meals.  Avoid drinking a lot of liquid with your meals.  Avoid eating meals during the 2-3 hours before you go to bed.  Avoid lying down right after you eat.  Do not exercise right after you eat. Medicines  Take over-the-counter and prescription medicines only as told by your doctor.  Do not take aspirin, ibuprofen, or other NSAIDs unless your doctor tells you to do that.  Your doctor may tell you to avoid medicines that have sodium bicarbonate in them. General instructions  If told, raise the head of your bed about 6 inches (15 cm). You can do this by putting blocks under  the legs. Sleeping with more pillows does not help with heartburn.  Do not use any products that contain nicotine or tobacco, such as cigarettes and e-cigarettes. If you need help quitting, ask your doctor.  Wear loose-fitting clothing.  Try to lower your stress, such as with yoga or meditation. If you need help, ask your doctor.  Stay at a healthy weight. If you are overweight, work with your doctor to safely lose weight.  Keep all follow-up visits as told by your doctor. This is important. Contact a doctor if:  You get new symptoms.  Your symptoms do not get better with treatment.  You have weight loss and you do not know why.  You have trouble swallowing.  You make loud sounds when you breathe (wheeze).  You have a cough that does not go away.  You have heartburn often for more than 2 weeks.  You feel sick to your stomach (nauseous), and this does not get better with treatment.  You are throwing up (vomiting), and this does not get better with treatment.  You have pain in your belly (abdomen). Get help right away if:  You have very bad chest pain that spreads to your arm, neck, or jaw.  You feel sweaty, dizzy, or light-headed.  You have trouble breathing.  You have pain when swallowing.  You throw up and your throw-up looks like blood or coffee grounds.  Your poop (stool) is bloody or black. This information is not intended to replace advice given to you by your health care provider.  Make sure you discuss any questions you have with your health care provider. Document Released: 07/13/2010 Document Revised: 02/26/2016 Document Reviewed: 02/26/2016 Elsevier Interactive Patient Education  2017 [employer].

## 2018-01-20 NOTE — MAU Provider Note (Signed)
History     CSN: [id number]  Arrival date and time: 01/20/18 1236   First Provider Initiated Contact with Patient 01/20/18 1502      Chief Complaint  Patient presents with  . Abdominal Pain  . Decreased Fetal Movement   HPI   Megan Coleman is 32 y.o. G30P2002 female at [redacted]w[redacted]d who presents for epigastric pain and burning sensation in her upper abdomen that started this morning. Reports it started prior to breakfast and has persisted since, increasing in intensity. This has occurred once before several years ago but was not severe enough to prompt patient to seek medical care and it self resolved. Has not tried taking anything at home for the symptoms. Denies LOF and vaginal bleeding. Reports decreased FM for one month.   OB History    Gravida  3   Para  2   Term  2   Preterm  0   AB  0   Living  2     SAB  0   TAB  0   Ectopic  0   Multiple  0   Live Births  2           Past Medical History:  Diagnosis Date  . Abdominal wall mass of left lower quadrant, possible endometrioma 07/31/2011  . Abscess    lower left abdomen/groin area  . Anemia   . Endometriosis of abdominal wall s/p excision [id number] 07/31/2011  . Fibroid   . Generalized headaches   . Lymph nodes enlarged   . Sickle cell trait [facility])     Past Surgical History:  Procedure Laterality Date  . APPENDECTOMY  04/02/2009   In [place]  . CESAREAN SECTION N/A 10/01/2013   Procedure: CESAREAN SECTION;  Surgeon: Truett Mainland, DO;  Location: [facility];  Service: Obstetrics;  Laterality: N/A;  . LAPAROSCOPY  08/28/2011   Procedure: LAPAROSCOPY DIAGNOSTIC;  Surgeon: Adin Hector, MD;  Location: [facility];  Service: General;  Laterality: N/A;  Diagnostic Laparoscopy with Removal of Abdominal Wall Mass  Open Ventral Wall Hernia Repair with Mesh   . MASS EXCISION  08/28/2011   Procedure: EXCISION MASS;  Surgeon: Adin Hector, MD;  Location: [facility];  Service: General;  Laterality: N/A;  Removal of Mass  Abdominal Wall   . MYOMECTOMY  07/03/2006   In [place]  . VENTRAL HERNIA REPAIR  08/28/2011   Procedure: HERNIA REPAIR VENTRAL ADULT;  Surgeon: Adin Hector, MD;  Location: [facility];  Service: General;  Laterality: N/A;   Open Ventral Wall Hernia with Mesh     No family history on file.  Social History   Tobacco Use  . Smoking status: Never Smoker  . Smokeless tobacco: Never Used  Substance Use Topics  . Alcohol use: No  . Drug use: No    Allergies: No Known Allergies  Medications Prior to Admission  Medication Sig Dispense Refill Last Dose  . Butalbital-APAP-Caffeine 50-325-40 MG capsule Take 1-2 capsules by mouth every 6 (six) hours as needed for headache. 30 capsule 3 Taking  . cyclobenzaprine (FLEXERIL) 10 MG tablet Take 1 tablet (10 mg total) by mouth every 8 (eight) hours as needed for muscle spasms. 30 tablet 1 Taking  . prenatal vitamin w/FE, FA (PRENATAL 1 + 1) 27-1 MG TABS tablet Take 1 tablet by mouth daily at 12 noon.   Taking    Review of Systems  Constitutional: Negative for chills and fever.  HENT: Negative for congestion.  Respiratory: Negative for chest tightness and shortness of breath.   Cardiovascular: Negative for chest pain.  Gastrointestinal: Positive for abdominal pain. Negative for constipation, diarrhea, nausea and vomiting.  Genitourinary: Negative for difficulty urinating, dysuria, vaginal bleeding and vaginal discharge.   Physical Exam   Blood pressure 118/67, pulse (!) 111, temperature 98.1 F (36.7 C), temperature source Oral, resp. rate 18, weight 199 lb (90.3 kg), last menstrual period 08/26/2017, currently breastfeeding.  Physical Exam  Constitutional: She appears well-developed and well-nourished. No distress.  HENT:  Head: Normocephalic and atraumatic.  Eyes: Conjunctivae and EOM are normal.  Cardiovascular: Normal rate and regular rhythm.  Respiratory: Effort normal and breath sounds normal.  GI: Soft. She exhibits no distension  and no mass. There is no tenderness. There is no rebound and no guarding.  Gravid.    Musculoskeletal: Normal range of motion. She exhibits no deformity.  Skin: Skin is warm and dry.    MAU Course  Procedures  MDM FHT obtained and appropriate. Symptoms sound most consistent with acid reflux. GI cocktail given. Upon reassessment, patient reports burning sensation and epigastric pain significantly improved.    Assessment and Plan   1. Heartburn during pregnancy in second trimester    Symptoms consistent with reflux during pregnancy. Explained that reflux is common during pregnancy due to the relaxation of the lower esophageal sphincter. Have prescribed trial of H2-blocker. Follow up with OB for routine PNC and prn.   Melina Schools 01/20/2018, 3:10 PM

## 2018-01-20 NOTE — MAU Note (Signed)
Pt C/O burning sensation all over her abdomen, also upper abdominal pain, feels drowsy.  Pain started this morning.  Denies bleeding or LOF.  Hasn't felt fetal movement since last month.

## 2018-02-04 ENCOUNTER — Ambulatory Visit (HOSPITAL_COMMUNITY)
Admission: RE | Admit: 2018-02-04 | Discharge: 2018-02-04 | Disposition: A | Discharge status: Home / Self Care | Payer: 59 | Source: Ambulatory Visit | Attending: Student | Admitting: Student

## 2018-02-04 ENCOUNTER — Encounter (HOSPITAL_COMMUNITY): Payer: Self-pay

## 2018-02-04 ENCOUNTER — Other Ambulatory Visit (HOSPITAL_COMMUNITY): Payer: Self-pay | Admitting: Obstetrics and Gynecology

## 2018-02-04 DIAGNOSIS — Z3686 Encounter for antenatal screening for cervical length: Secondary | ICD-10-CM | POA: Diagnosis not present

## 2018-02-04 DIAGNOSIS — Z362 Encounter for other antenatal screening follow-up: Secondary | ICD-10-CM | POA: Insufficient documentation

## 2018-02-04 DIAGNOSIS — Z3480 Encounter for supervision of other normal pregnancy, unspecified trimester: Secondary | ICD-10-CM

## 2018-02-04 DIAGNOSIS — O34219 Maternal care for unspecified type scar from previous cesarean delivery: Secondary | ICD-10-CM

## 2018-02-04 DIAGNOSIS — Z3A23 23 weeks gestation of pregnancy: Secondary | ICD-10-CM | POA: Diagnosis not present

## 2018-02-10 ENCOUNTER — Ambulatory Visit (INDEPENDENT_AMBULATORY_CARE_PROVIDER_SITE_OTHER): Payer: 59 | Admitting: Student

## 2018-02-10 VITALS — BP 123/65 | HR 102 | Wt 201.9 lb

## 2018-02-10 DIAGNOSIS — D573 Sickle-cell trait: Secondary | ICD-10-CM

## 2018-02-10 DIAGNOSIS — Z3480 Encounter for supervision of other normal pregnancy, unspecified trimester: Secondary | ICD-10-CM

## 2018-02-10 NOTE — Progress Notes (Signed)
   PRENATAL VISIT NOTE  Subjective:  Megan Coleman is a 31 y.o. G3P2002 at [redacted]w[redacted]d being seen today for ongoing prenatal care.  She is currently monitored for the following issues for this low-risk pregnancy and has Endometriosis of abdominal wall s/p excision [id number]; H/O myomectomy; S/P C-section; Thrombocytopenia ([place]); Sickle cell trait ([place]); Supervision of other normal pregnancy, antepartum; Headache due to injury of head and neck; and Pregnancy headache, antepartum on their problem list.  Patient reports hip pain.  Contractions: Not present. Vag. Bleeding: None.  Movement: Present. Denies leaking of fluid.   The following portions of the patient's history were reviewed and updated as appropriate: allergies, current medications, past family history, past medical history, past social history, past surgical history and problem list. Problem list updated.  Objective:   Vitals:   02/10/18 1553  BP: 123/65  Pulse: (!) 102  Weight: 201 lb 14.4 oz (91.6 kg)    Fetal Status: Fetal Heart Rate (bpm): 161 Fundal Height: 24 cm Movement: Present     General:  Alert, oriented and cooperative. Patient is in no acute distress.  Skin: Skin is warm and dry. No rash noted.   Cardiovascular: Normal heart rate noted  Respiratory: Normal respiratory effort, no problems with respiration noted  Abdomen: Soft, gravid, appropriate for gestational age.  Pain/Pressure: Present     Pelvic: Cervical exam deferred        Extremities: Normal range of motion.  Edema: Trace  Mental Status: Normal mood and affect. Normal behavior. Normal judgment and thought content.   Assessment and Plan:  Pregnancy: G3P2002 at [redacted]w[redacted]d  1. Supervision of other normal pregnancy, antepartum -Has hip pain; talked about stretches, heat, for relief. Continue to wear maternity support belt as much as possible.   2. Sickle cell trait ([place]) Repeat culture today - Culture, OB Urine  Preterm labor symptoms and general obstetric  precautions including but not limited to vaginal bleeding, contractions, leaking of fluid and fetal movement were reviewed in detail with the patient. Please refer to After Visit Summary for other counseling recommendations.  Return in about 1 week (around 02/17/2018), or 4 weeks LROB and 2 hour gtt.  Future Appointments  Date Time Provider [place]  02/20/2018  9:30 AM Teague Darrol Angel [facility]  03/11/2018  8:15 AM Jorje Guild, NP [facility]  03/11/2018  8:50 AM [facility] [place], [place]

## 2018-02-10 NOTE — Patient Instructions (Signed)

## 2018-02-12 LAB — URINE CULTURE, OB REFLEX

## 2018-02-12 LAB — CULTURE, OB URINE

## 2018-02-20 ENCOUNTER — Ambulatory Visit (INDEPENDENT_AMBULATORY_CARE_PROVIDER_SITE_OTHER): Payer: 59 | Admitting: Physician Assistant

## 2018-02-20 ENCOUNTER — Encounter: Payer: Self-pay | Admitting: Physician Assistant

## 2018-02-20 VITALS — BP 110/73 | HR 109 | Wt 201.8 lb

## 2018-02-20 DIAGNOSIS — G44329 Chronic post-traumatic headache, not intractable: Secondary | ICD-10-CM

## 2018-02-20 DIAGNOSIS — R51 Headache: Secondary | ICD-10-CM

## 2018-02-20 DIAGNOSIS — O26899 Other specified pregnancy related conditions, unspecified trimester: Secondary | ICD-10-CM

## 2018-02-20 NOTE — Patient Instructions (Signed)
Occipital Nerve Block Patient Information  Description: The occipital nerves originate in the cervical (neck) spinal cord and travel upward through muscle and tissue to supply sensation to the back of the head and top of the scalp.  In addition, the nerves control some of the muscles of the scalp.  Occipital neuralgia is an irritation of these nerves which can cause headaches, numbness of the scalp, and neck discomfort.     The occipital nerve block will interrupt nerve transmission through these nerves and can relieve pain and spasm.  The block consists of insertion of a small needle under the skin in the back of the head to deposit local anesthetic (numbing medicine) and/or steroids around the nerve.  The entire block usually lasts less than 5 minutes.  Conditions which may be treated by occipital blocks:   Muscular pain and spasm of the scalp  Nerve irritation, back of the head  Headaches  Upper neck pain  Preparation for the injection:  1. Do not eat any solid food or dairy products within 8 hours of your appointment. 2. You may drink clear liquids up to 3 hours before appointment.  Clear liquids include water, black coffee, juice or soda.  No milk or cream please. 3. You may take your regular medication, including pain medications, with a sip of water before you appointment.  Diabetics should hold regular insulin (if taken separately) and take 1/2 normal NPH dose the morning of the procedure.  Carry some sugar containing items with you to your appointment. 4. A driver must accompany you and be prepared to drive you home after your procedure. 5. Bring all your current medications with you. 6. An IV may be inserted and sedation may be given at the discretion of the physician. 7. A blood pressure cuff, EKG, and other monitors will often be applied during the procedure.  Some patients may need to have extra oxygen administered for a short period. 8. You will be asked to provide medical  information, including your allergies and medications, prior to the procedure.  We must know immediately if you are taking blood thinners (like Coumadin/Warfarin) or if you are allergic to IV iodine contrast (dye).  We must know if you could possible be pregnant.  9. Do not wear a high collared shirt or turtleneck.  Tie long hair up in the back if possible.  Possible side-effects:   Bleeding from needle site  Infection (rare, may require surgery)  Nerve injury (rare)  Hair on back of neck can be tinged with iodine scrub (this will wash out)  Light-headedness (temporary)  Pain at injection site (several days)  Decreased blood pressure (rare, temporary)  Seizure (very rare)  Call if you experience:   Hives or difficulty breathing ( go to the emergency room)  Inflammation or drainage at the injection site(s)  Please note:  Although the local anesthetic injected can often make your painful muscles or headache feel good for several hours after the injection, the pain may return.  It takes 3-7 days for steroids to work.  You may not notice any pain relief for at least one week.  If effective, we will often do a series of injections spaced 3-6 weeks apart to maximally decrease your pain.  If you have any questions, please call [phone] [facility]

## 2018-02-20 NOTE — Progress Notes (Signed)
History:  Megan Coleman is a 32 y.o. G0F7494 who presents to clinic today for headache.  She has chronic pain since her head injury a few years back.  Last visit, nerve blocks were performed.  She would like to have this done again as it helped for at least a week.     Past Medical History:  Diagnosis Date  . Abdominal wall mass of left lower quadrant, possible endometrioma 07/31/2011  . Abscess    lower left abdomen/groin area  . Anemia   . Endometriosis of abdominal wall s/p excision [id number] 07/31/2011  . Fibroid   . Generalized headaches   . Lymph nodes enlarged   . Sickle cell trait ([place])     Social History   Socioeconomic History  . Marital status: Single    Spouse name: Not on file  . Number of children: Not on file  . Years of education: Not on file  . Highest education level: Not on file  Occupational History  . Not on file  Social Needs  . Financial resource strain: Not on file  . Food insecurity:    Worry: Not on file    Inability: Not on file  . Transportation needs:    Medical: Not on file    Non-medical: Not on file  Tobacco Use  . Smoking status: Never Smoker  . Smokeless tobacco: Never Used  Substance and Sexual Activity  . Alcohol use: No  . Drug use: No  . Sexual activity: Yes    Birth control/protection: None  Lifestyle  . Physical activity:    Days per week: Not on file    Minutes per session: Not on file  . Stress: Not on file  Relationships  . Social connections:    Talks on phone: Not on file    Gets together: Not on file    Attends religious service: Not on file    Active member of club or organization: Not on file    Attends meetings of clubs or organizations: Not on file    Relationship status: Not on file  . Intimate partner violence:    Fear of current or ex partner: Not on file    Emotionally abused: Not on file    Physically abused: Not on file    Forced sexual activity: Not on file  Other Topics Concern  . Not on file   Social History Narrative   Originally from [place], [place]    No family history on file.  No Known Allergies  Current Outpatient Medications on File Prior to Visit  Medication Sig Dispense Refill  . prenatal vitamin w/FE, FA (PRENATAL 1 + 1) 27-1 MG TABS tablet Take 1 tablet by mouth daily at 12 noon.    . Butalbital-APAP-Caffeine 50-325-40 MG capsule Take 1-2 capsules by mouth every 6 (six) hours as needed for headache. (Patient not taking: Reported on 02/20/2018) 30 capsule 3  . cyclobenzaprine (FLEXERIL) 10 MG tablet Take 1 tablet (10 mg total) by mouth every 8 (eight) hours as needed for muscle spasms. (Patient not taking: Reported on 02/20/2018) 30 tablet 1  . ranitidine (ZANTAC) 150 MG tablet Take 1 tablet (150 mg total) by mouth 2 (two) times daily. (Patient not taking: Reported on 02/04/2018) 60 tablet 0   No current facility-administered medications on file prior to visit.      Review of Systems:  All pertinent positive/negative included in HPI, all other review of systems are negative   Objective:  Physical Exam  BP 110/73   Pulse (!) 109   Wt 201 lb 12.8 oz (91.5 kg)   LMP 08/26/2017   BMI 31.61 kg/m  CONSTITUTIONAL: Well-developed, well-nourished female in no acute distress.  EYES: EOM intact ENT: Normocephalic CARDIOVASCULAR: Regular rate   RESPIRATORY: Normal rate.  MUSCULOSKELETAL: Normal ROM SKIN: Warm, dry without erythema  NEUROLOGICAL: Alert, oriented, CN II-XII grossly intact, Appropriate balance PSYCH: Normal behavior, mood  PROCEDURE:  TRIGGER POINT INJECTIONS  Procedure: Mixture of 1%  Lidocaine, marcaine and dexamethazone in a ratio of 2:2:1  injected with 1cc each site in bilateral greater Occipital Nerves, bilateral lesser occipital nerves, left cervical paraspinal muscles, left trapezius, left temporalis.  Total amount injected: 8cc.  Pt tolerated procedure well.   Assessment & Plan:  Assessment: 1. Chronic post-traumatic headache, not  intractable   2. Pregnancy headache, antepartum      Plan: Nerve blocks and trigger point injections done today to help with pain.  Pt to continue using flexeril as prescribed.  Pt advised NOT to use fioricet more than 2-3 times per week to avoid rebound/medication overuse HA.   RTC PRN   Lacie Draft 02/20/2018 9:49 AM

## 2018-03-10 ENCOUNTER — Other Ambulatory Visit: Payer: Self-pay | Admitting: General Practice

## 2018-03-10 DIAGNOSIS — Z3480 Encounter for supervision of other normal pregnancy, unspecified trimester: Secondary | ICD-10-CM

## 2018-03-11 ENCOUNTER — Ambulatory Visit (INDEPENDENT_AMBULATORY_CARE_PROVIDER_SITE_OTHER): Payer: 59 | Admitting: Student

## 2018-03-11 ENCOUNTER — Encounter (HOSPITAL_COMMUNITY): Payer: Self-pay

## 2018-03-11 ENCOUNTER — Other Ambulatory Visit: Payer: 59

## 2018-03-11 VITALS — BP 126/68 | HR 98 | Wt 201.0 lb

## 2018-03-11 DIAGNOSIS — Z23 Encounter for immunization: Secondary | ICD-10-CM | POA: Diagnosis not present

## 2018-03-11 DIAGNOSIS — Z3480 Encounter for supervision of other normal pregnancy, unspecified trimester: Secondary | ICD-10-CM

## 2018-03-11 DIAGNOSIS — Z3483 Encounter for supervision of other normal pregnancy, third trimester: Secondary | ICD-10-CM

## 2018-03-11 DIAGNOSIS — N760 Acute vaginitis: Secondary | ICD-10-CM

## 2018-03-11 MED ORDER — TERCONAZOLE 0.8 % VA CREA: 1.0000 | TOPICAL_CREAM | Freq: Every day | VAGINAL | 0 refills | Status: DC

## 2018-03-11 NOTE — Progress Notes (Signed)
   PRENATAL VISIT NOTE  Subjective:  Megan Coleman is a 32 y.o. G3P2002 at [redacted]w[redacted]d being seen today for ongoing prenatal care.  She is currently monitored for the following issues for this low-risk pregnancy and has Endometriosis of abdominal wall s/p excision [id number]; H/O myomectomy; S/P C-section; Thrombocytopenia ([place]); Sickle cell trait ([place]); Supervision of other normal pregnancy, antepartum; Headache due to injury of head and neck; and Pregnancy headache, antepartum on their problem list.  Patient reports vaginal irritation. Feels like she has a yeast infection. Hasn't noticed discharge. No change in soaps or detergent. Hasn't treated symptoms.  Also reports for the last week has been feeling weak in the mornings. No fevers, URI symptoms, n/v/d.  Contractions: Not present. Vag. Bleeding: None.  Movement: Present. Denies leaking of fluid.   The following portions of the patient's history were reviewed and updated as appropriate: allergies, current medications, past family history, past medical history, past social history, past surgical history and problem list. Problem list updated.  Objective:   Vitals:   03/11/18 0834  BP: 126/68  Pulse: 98  Weight: 201 lb (91.2 kg)    Fetal Status: Fetal Heart Rate (bpm): 158 Fundal Height: 29 cm Movement: Present     General:  Alert, oriented and cooperative. Patient is in no acute distress.  Skin: Skin is warm and dry. No rash noted.   Cardiovascular: Normal heart rate noted  Respiratory: Normal respiratory effort, no problems with respiration noted  Abdomen: Soft, gravid, appropriate for gestational age.  Pain/Pressure: Present     Pelvic: Cervical exam deferred        Extremities: Normal range of motion.  Edema: Trace  Mental Status: Normal mood and affect. Normal behavior. Normal judgment and thought content.   Assessment and Plan:  Pregnancy: G3P2002 at [redacted]w[redacted]d  1. Supervision of other normal pregnancy, antepartum -28 wk labs  today -Improvement in headaches since last visit with [facility] -- states not using meds as much -Still considering BTL but undecided. Will schedule next visit with MD to discuss/schedule upcoming RCS & possible BTL  2. Need for Tdap vaccination  - Tdap vaccine greater than or equal to 7yo IM  3. Acute vaginitis  - terconazole (TERAZOL 3) 0.8 % vaginal cream; Place 1 applicator vaginally at bedtime.  Dispense: 20 g; Refill: 0  Preterm labor symptoms and general obstetric precautions including but not limited to vaginal bleeding, contractions, leaking of fluid and fetal movement were reviewed in detail with the patient. Please refer to After Visit Summary for other counseling recommendations.  Return in about 2 weeks (around 03/25/2018) for Routine OB w/MD to discuss c/section, then can return to APP schedule.  No future appointments.  Jorje Guild, NP

## 2018-03-11 NOTE — Patient Instructions (Signed)
Research childbirth classes and hospital preregistration at [email or web address]  Fetal Movement Counts Patient Name: ________________________________________________ Patient Due Date: ____________________ What is a fetal movement count? A fetal movement count is the number of times that you feel your baby move during a certain amount of time. This may also be called a fetal kick count. A fetal movement count is recommended for every pregnant woman. You may be asked to start counting fetal movements as early as week 28 of your pregnancy. Pay attention to when your baby is most active. You may notice your baby's sleep and wake cycles. You may also notice things that make your baby move more. You should do a fetal movement count:  When your baby is normally most active.  At the same time each day.  A good time to count movements is while you are resting, after having something to eat and drink. How do I count fetal movements? 1. Find a quiet, comfortable area. Sit, or lie down on your side. 2. Write down the date, the start time and stop time, and the number of movements that you felt between those two times. Take this information with you to your health care visits. 3. For 2 hours, count kicks, flutters, swishes, rolls, and jabs. You should feel at least 10 movements during 2 hours. 4. You may stop counting after you have felt 10 movements. 5. If you do not feel 10 movements in 2 hours, have something to eat and drink. Then, keep resting and counting for 1 hour. If you feel at least 4 movements during that hour, you may stop counting. Contact a health care provider if:  You feel fewer than 4 movements in 2 hours.  Your baby is not moving like he or she usually does. Date: ____________ Start time: ____________ Stop time: ____________ Movements: ____________ Date: ____________ Start time: ____________ Stop time: ____________ Movements: ____________ Date: ____________ Start time: ____________  Stop time: ____________ Movements: ____________ Date: ____________ Start time: ____________ Stop time: ____________ Movements: ____________ Date: ____________ Start time: ____________ Stop time: ____________ Movements: ____________ Date: ____________ Start time: ____________ Stop time: ____________ Movements: ____________ Date: ____________ Start time: ____________ Stop time: ____________ Movements: ____________ Date: ____________ Start time: ____________ Stop time: ____________ Movements: ____________ Date: ____________ Start time: ____________ Stop time: ____________ Movements: ____________ This information is not intended to replace advice given to you by your health care provider. Make sure you discuss any questions you have with your health care provider. Document Released: 07/10/2006 Document Revised: 02/07/2016 Document Reviewed: 07/20/2015 Elsevier Interactive Patient Education  2018 [employer]  Braxton Hicks Contractions Contractions of the uterus can occur throughout pregnancy, but they are not always a sign that you are in labor. You may have practice contractions called Braxton Hicks contractions. These false labor contractions are sometimes confused with true labor. What are Braxton Hicks contractions? Braxton Hicks contractions are tightening movements that occur in the muscles of the uterus before labor. Unlike true labor contractions, these contractions do not result in opening (dilation) and thinning of the cervix. Toward the end of pregnancy (32-34 weeks), Braxton Hicks contractions can happen more often and may become stronger. These contractions are sometimes difficult to tell apart from true labor because they can be very uncomfortable. You should not feel embarrassed if you go to the hospital with false labor. Sometimes, the only way to tell if you are in true labor is for your health care provider to look for changes in the cervix. The health care provider will   do a physical  exam and may monitor your contractions. If you are not in true labor, the exam should show that your cervix is not dilating and your water has not broken. If there are other health problems associated with your pregnancy, it is completely safe for you to be sent home with false labor. You may continue to have Braxton Hicks contractions until you go into true labor. How to tell the difference between true labor and false labor True labor  Contractions last 30-70 seconds.  Contractions become very regular.  Discomfort is usually felt in the top of the uterus, and it spreads to the lower abdomen and low back.  Contractions do not go away with walking.  Contractions usually become more intense and increase in frequency.  The cervix dilates and gets thinner. False labor  Contractions are usually shorter and not as strong as true labor contractions.  Contractions are usually irregular.  Contractions are often felt in the front of the lower abdomen and in the groin.  Contractions may go away when you walk around or change positions while lying down.  Contractions get weaker and are shorter-lasting as time goes on.  The cervix usually does not dilate or become thin. Follow these instructions at home:  Take over-the-counter and prescription medicines only as told by your health care provider.  Keep up with your usual exercises and follow other instructions from your health care provider.  Eat and drink lightly if you think you are going into labor.  If Braxton Hicks contractions are making you uncomfortable: ? Change your position from lying down or resting to walking, or change from walking to resting. ? Sit and rest in a tub of warm water. ? Drink enough fluid to keep your urine pale yellow. Dehydration may cause these contractions. ? Do slow and deep breathing several times an hour.  Keep all follow-up prenatal visits as told by your health care provider. This is  important. Contact a health care provider if:  You have a fever.  You have continuous pain in your abdomen. Get help right away if:  Your contractions become stronger, more regular, and closer together.  You have fluid leaking or gushing from your vagina.  You pass blood-tinged mucus (bloody show).  You have bleeding from your vagina.  You have low back pain that you never had before.  You feel your baby's head pushing down and causing pelvic pressure.  Your baby is not moving inside you as much as it used to. Summary  Contractions that occur before labor are called Braxton Hicks contractions, false labor, or practice contractions.  Braxton Hicks contractions are usually shorter, weaker, farther apart, and less regular than true labor contractions. True labor contractions usually become progressively stronger and regular and they become more frequent.  Manage discomfort from Braxton Hicks contractions by changing position, resting in a warm bath, drinking plenty of water, or practicing deep breathing. This information is not intended to replace advice given to you by your health care provider. Make sure you discuss any questions you have with your health care provider. Document Released: 10/24/2016 Document Revised: 10/24/2016 Document Reviewed: 10/24/2016 Elsevier Interactive Patient Education  2018 [employer]    

## 2018-03-12 LAB — CBC
HEMATOCRIT: 28.9 % — AB (ref 34.0–46.6)
HEMOGLOBIN: 9.4 g/dL — AB (ref 11.1–15.9)
MCH: 26 pg — ABNORMAL LOW (ref 26.6–33.0)
MCHC: 32.5 g/dL (ref 31.5–35.7)
MCV: 80 fL (ref 79–97)
Platelets: 165 10*3/uL (ref 150–450)
RBC: 3.61 x10E6/uL — ABNORMAL LOW (ref 3.77–5.28)
RDW: 13.9 % (ref 12.3–15.4)
WBC: 3.3 10*3/uL — ABNORMAL LOW (ref 3.4–10.8)

## 2018-03-12 LAB — RPR: RPR: NONREACTIVE

## 2018-03-12 LAB — GLUCOSE TOLERANCE, 2 HOURS W/ 1HR
GLUCOSE, 1 HOUR: 145 mg/dL (ref 65–179)
GLUCOSE, 2 HOUR: 132 mg/dL (ref 65–152)
GLUCOSE, FASTING: 78 mg/dL (ref 65–91)

## 2018-03-12 LAB — HIV ANTIBODY (ROUTINE TESTING W REFLEX): HIV SCREEN 4TH GENERATION: NONREACTIVE

## 2018-03-13 ENCOUNTER — Other Ambulatory Visit: Payer: Self-pay | Admitting: Student

## 2018-03-13 DIAGNOSIS — O99013 Anemia complicating pregnancy, third trimester: Secondary | ICD-10-CM

## 2018-03-13 MED ORDER — FERROUS SULFATE 325 (65 FE) MG PO TABS: 325.0000 mg | ORAL_TABLET | Freq: Two times a day (BID) | ORAL | 2 refills | Status: DC

## 2018-03-16 ENCOUNTER — Other Ambulatory Visit: Payer: Self-pay | Admitting: Family Medicine

## 2018-03-26 ENCOUNTER — Ambulatory Visit (INDEPENDENT_AMBULATORY_CARE_PROVIDER_SITE_OTHER): Payer: 59 | Admitting: Obstetrics & Gynecology

## 2018-03-26 DIAGNOSIS — Z3480 Encounter for supervision of other normal pregnancy, unspecified trimester: Secondary | ICD-10-CM

## 2018-03-26 NOTE — Patient Instructions (Addendum)

## 2018-03-26 NOTE — Progress Notes (Signed)
   PRENATAL VISIT NOTE  Subjective:  Megan Coleman is a 32 y.o. G3P2002 at [redacted]w[redacted]d being seen today for ongoing prenatal care.  She is currently monitored for the following issues for this low-risk pregnancy and has Endometriosis of abdominal wall s/p excision [id number]; H/O myomectomy; S/P C-section; Thrombocytopenia ([place]); Sickle cell trait ([place]); Supervision of other normal pregnancy, antepartum; Headache due to injury of head and neck; and Pregnancy headache, antepartum on their problem list.  Patient reports no complaints.  Contractions: Not present. Vag. Bleeding: None.  Movement: Present. Denies leaking of fluid.   The following portions of the patient's history were reviewed and updated as appropriate: allergies, current medications, past family history, past medical history, past social history, past surgical history and problem list. Problem list updated.  Objective:   Vitals:   03/26/18 0919  BP: 120/61  Pulse: (!) 106  Weight: 201 lb (91.2 kg)    Fetal Status: Fetal Heart Rate (bpm): 154   Movement: Present     General:  Alert, oriented and cooperative. Patient is in no acute distress.  Skin: Skin is warm and dry. No rash noted.   Cardiovascular: Normal heart rate noted  Respiratory: Normal respiratory effort, no problems with respiration noted  Abdomen: Soft, gravid, appropriate for gestational age.  Pain/Pressure: Present     Pelvic: Cervical exam deferred        Extremities: Normal range of motion.  Edema: Trace  Mental Status: Normal mood and affect. Normal behavior. Normal judgment and thought content.   Assessment and Plan:  Pregnancy: G3P2002 at [redacted]w[redacted]d  1. Supervision of other normal pregnancy, antepartum Repeat CS and BTL 37 weeks is scheduled  Preterm labor symptoms and general obstetric precautions including but not limited to vaginal bleeding, contractions, leaking of fluid and fetal movement were reviewed in detail with the patient. Please refer to After  Visit Summary for other counseling recommendations.  Return in about 2 weeks (around 04/09/2018).  No future appointments.  Emeterio Reeve, MD

## 2018-04-09 ENCOUNTER — Other Ambulatory Visit: Payer: Self-pay

## 2018-04-09 ENCOUNTER — Ambulatory Visit (INDEPENDENT_AMBULATORY_CARE_PROVIDER_SITE_OTHER): Payer: 59 | Admitting: Obstetrics and Gynecology

## 2018-04-09 VITALS — BP 122/62 | HR 102 | Wt 201.0 lb

## 2018-04-09 DIAGNOSIS — D509 Iron deficiency anemia, unspecified: Secondary | ICD-10-CM

## 2018-04-09 DIAGNOSIS — D649 Anemia, unspecified: Secondary | ICD-10-CM | POA: Insufficient documentation

## 2018-04-09 DIAGNOSIS — D573 Sickle-cell trait: Secondary | ICD-10-CM

## 2018-04-09 DIAGNOSIS — R002 Palpitations: Secondary | ICD-10-CM

## 2018-04-09 DIAGNOSIS — Z98891 History of uterine scar from previous surgery: Secondary | ICD-10-CM

## 2018-04-09 DIAGNOSIS — Z3480 Encounter for supervision of other normal pregnancy, unspecified trimester: Secondary | ICD-10-CM

## 2018-04-09 DIAGNOSIS — Z3483 Encounter for supervision of other normal pregnancy, third trimester: Secondary | ICD-10-CM

## 2018-04-09 DIAGNOSIS — O99013 Anemia complicating pregnancy, third trimester: Secondary | ICD-10-CM

## 2018-04-09 MED ORDER — FERUMOXYTOL INJECTION 510 MG/17 ML: 510.0000 mg | Freq: Once | INTRAVENOUS | 0 refills | Status: DC

## 2018-04-09 MED ORDER — FERROUS SULFATE 325 (65 FE) MG PO TABS: 325.0000 mg | ORAL_TABLET | Freq: Two times a day (BID) | ORAL | 2 refills | Status: DC

## 2018-04-09 NOTE — Progress Notes (Signed)
   PRENATAL VISIT NOTE  Subjective:  Megan Coleman is a 32 y.o. G3P2002 at [redacted]w[redacted]d being seen today for ongoing prenatal care.  She is currently monitored for the following issues for this low-risk pregnancy and has Endometriosis of abdominal wall s/p excision [id number]; H/O myomectomy; S/P C-section; Thrombocytopenia ([place]); Sickle cell trait ([place]); Supervision of other normal pregnancy, antepartum; Headache due to injury of head and neck; Pregnancy headache, antepartum; Anemia; and Palpitations on their problem list.  Patient reports palpations and weakness symptoms have worsened in the last week.   Contractions: Not present. Vag. Bleeding: None.  Movement: Present. Denies leaking of fluid.   The following portions of the patient's history were reviewed and updated as appropriate: allergies, current medications, past family history, past medical history, past social history, past surgical history and problem list. Problem list updated.  Objective:   Vitals:   04/09/18 1004  BP: 122/62  Pulse: (!) 102  Weight: 201 lb (91.2 kg)    Fetal Status: Fetal Heart Rate (bpm): 152 Fundal Height: 33 cm Movement: Present     General:  Alert, oriented and cooperative. Patient is in no acute distress.  Skin: Skin is warm and dry. No rash noted.   Cardiovascular: Normal heart rate noted  Respiratory: Normal respiratory effort, no problems with respiration noted  Abdomen: Soft, gravid, appropriate for gestational age.  Pain/Pressure: Present     Pelvic: Cervical exam deferred        Extremities: Normal range of motion.  Edema: Trace  Mental Status: Normal mood and affect. Normal behavior. Normal judgment and thought content.   Assessment and Plan:  Pregnancy: G3P2002 at [redacted]w[redacted]d  1. Iron deficiency anemia, unspecified iron deficiency anemia type - Vitamin B12 - Folate - Iron and TIBC - Ferritin - EKG 12-Lead  Fereheme infusion  scheduled for 10/24 @ 11:00 am  2. S/P C-section  Repeat:  Consent signed today 04/09/2018  3. Sickle cell trait ([facility]) - Urine Culture  4. Supervision of other normal pregnancy, antepartum  5. Palpitations.  - Tachycardiac today, feels more tired, likely 2/2 anemia. She ran out of iron pills and hasn't taken them in 2 weeks. Rx given today - EKG 12-Lead, Normal EKG, Reviewed EKG with Dr. Juleen China - Referral to Cardiology, discussed with Dr. Elly Modena.    There are no diagnoses linked to this encounter. Preterm labor symptoms and general obstetric precautions including but not limited to vaginal bleeding, contractions, leaking of fluid and fetal movement were reviewed in detail with the patient. Please refer to After Visit Summary for other counseling recommendations.  Return in about 2 weeks (around 04/23/2018), or at 11:00 for iron infusion.  Future Appointments  Date Time Provider [place]  04/16/2018 11:00 AM [facility] ROOM 9 [facility] None    Noni Saupe, NP

## 2018-04-10 ENCOUNTER — Other Ambulatory Visit: Payer: Self-pay | Admitting: Physician Assistant

## 2018-04-10 LAB — IRON AND TIBC
Iron Saturation: 18 % (ref 15–55)
Iron: 81 ug/dL (ref 27–159)
TIBC: 456 ug/dL — AB (ref 250–450)
UIBC: 375 ug/dL (ref 131–425)

## 2018-04-10 LAB — FOLATE: FOLATE: 11.9 ng/mL (ref >3.0)

## 2018-04-10 LAB — FERRITIN: FERRITIN: 38 ng/mL (ref 15–150)

## 2018-04-10 LAB — VITAMIN B12: VITAMIN B 12: 522 pg/mL (ref 232–1245)

## 2018-04-11 LAB — URINE CULTURE

## 2018-04-14 ENCOUNTER — Encounter: Payer: Self-pay | Admitting: *Deleted

## 2018-04-14 ENCOUNTER — Other Ambulatory Visit: Payer: Self-pay | Admitting: Obstetrics and Gynecology

## 2018-04-16 ENCOUNTER — Ambulatory Visit (HOSPITAL_COMMUNITY)
Admission: RE | Admit: 2018-04-16 | Discharge: 2018-04-16 | Disposition: A | Discharge status: Home / Self Care | Payer: 59 | Source: Ambulatory Visit | Attending: Obstetrics and Gynecology | Admitting: Obstetrics and Gynecology

## 2018-04-16 DIAGNOSIS — D509 Iron deficiency anemia, unspecified: Secondary | ICD-10-CM | POA: Insufficient documentation

## 2018-04-16 MED ORDER — SODIUM CHLORIDE 0.9 % IV SOLN
510.0000 mg | INTRAVENOUS | Status: DC
  Administered 2018-04-16: 510 mg via INTRAVENOUS
  Filled 2018-04-16: qty 17

## 2018-04-16 NOTE — Discharge Instructions (Signed)

## 2018-04-23 ENCOUNTER — Ambulatory Visit (HOSPITAL_COMMUNITY)
Admission: RE | Admit: 2018-04-23 | Discharge: 2018-04-23 | Disposition: A | Discharge status: Home / Self Care | Payer: 59 | Source: Ambulatory Visit | Attending: Obstetrics and Gynecology | Admitting: Obstetrics and Gynecology

## 2018-04-23 DIAGNOSIS — D509 Iron deficiency anemia, unspecified: Secondary | ICD-10-CM | POA: Diagnosis not present

## 2018-04-23 MED ORDER — SODIUM CHLORIDE 0.9 % IV SOLN
510.0000 mg | INTRAVENOUS | Status: DC
  Administered 2018-04-23: 510 mg via INTRAVENOUS
  Filled 2018-04-23: qty 17

## 2018-04-24 ENCOUNTER — Ambulatory Visit (INDEPENDENT_AMBULATORY_CARE_PROVIDER_SITE_OTHER): Payer: 59 | Admitting: Student

## 2018-04-24 VITALS — BP 115/67 | HR 98 | Wt 202.6 lb

## 2018-04-24 DIAGNOSIS — Z3480 Encounter for supervision of other normal pregnancy, unspecified trimester: Secondary | ICD-10-CM

## 2018-04-24 NOTE — Progress Notes (Signed)
   PRENATAL VISIT NOTE  Subjective:  Megan Coleman is a 32 y.o. G3P2002 at [redacted]w[redacted]d being seen today for ongoing prenatal care.  She is currently monitored for the following issues for this high-risk pregnancy and has Endometriosis of abdominal wall s/p excision [id number]; H/O myomectomy; S/P C-section; Thrombocytopenia ([place]); Sickle cell trait ([place]); Supervision of other normal pregnancy, antepartum; Headache due to injury of head and neck; Pregnancy headache, antepartum; Anemia; and Palpitations on their problem list.  Patient reports no complaints.  Contractions: Irregular. Vag. Bleeding: None.  Movement: Present. Denies leaking of fluid.   The following portions of the patient's history were reviewed and updated as appropriate: allergies, current medications, past family history, past medical history, past social history, past surgical history and problem list. Problem list updated.  Objective:   Vitals:   04/24/18 0954  BP: 115/67  Pulse: 98  Weight: 202 lb 9.6 oz (91.9 kg)    Fetal Status: Fetal Heart Rate (bpm): 149 Fundal Height: 35 cm Movement: Present     General:  Alert, oriented and cooperative. Patient is in no acute distress.  Skin: Skin is warm and dry. No rash noted.   Cardiovascular: Normal heart rate noted  Respiratory: Normal respiratory effort, no problems with respiration noted  Abdomen: Soft, gravid, appropriate for gestational age.  Pain/Pressure: Present     Pelvic: Cervical exam deferred        Extremities: Normal range of motion.  Edema: None  Mental Status: Normal mood and affect. Normal behavior. Normal judgment and thought content.   Assessment and Plan:  Pregnancy: G3P2002 at [redacted]w[redacted]d  1. Supervision of other normal pregnancy, antepartum -scheduled for RCS & BTL -signed tubal papers last visit -Completes feraheme infusions. Will check CBC next visit  Preterm labor symptoms and general obstetric precautions including but not limited to vaginal  bleeding, contractions, leaking of fluid and fetal movement were reviewed in detail with the patient. Please refer to After Visit Summary for other counseling recommendations.  Return in about 2 weeks (around 05/08/2018) for Routine OB.  Future Appointments  Date Time Provider [place]  05/08/2018 10:35 AM Jorje Guild, NP [facility] [place]  05/15/2018 10:35 AM Jorje Guild, NP [facility] [place]  05/25/2018  9:35 AM Tresea Mall, CNM [facility] [place]  06/01/2018  9:35 AM Tresea Mall, CNM [facility]    Jorje Guild, NP

## 2018-04-24 NOTE — Patient Instructions (Addendum)
Places to have your son circumcised:    [facility] [phone] while you are in hospital  [facility] [phone] $244 by 4 wks  [facility] [phone] $175 by 2 wks  [facility] [phone] $250 by 7 days [facility] [phone] $269 by 4 wks  These prices sometimes change but are roughly what you can expect to pay. Please call and confirm pricing.   Circumcision is considered an elective/non-medically necessary procedure. There are many reasons parents decide to have their sons circumsized. During the first year of life circumcised males have a reduced risk of urinary tract infections but after this year the rates between circumcised males and uncircumcised males are the same.  It is safe to have your son circumcised outside of the hospital and the places above perform them regularly.   Deciding about Circumcision in Baby Boys  (Up-to-date The Basics)  What is circumcision?  Circumcision is a surgery that removes the skin that covers the tip of the penis, called the "foreskin" Circumcision is usually done when a boy is between 66 and 51 days old. In the [place], circumcision is common. In some other countries, fewer boys are circumcised. Circumcision is a common tradition in some religions.  Should I have my baby boy circumcised?  There is no easy answer. Circumcision has some benefits. But it also has risks. After talking with your doctor, you will have to decide for yourself what is right for your family.  What are the benefits of circumcision?  Circumcised boys seem to have slightly lower rates of: ?Urinary tract infections ?Swelling of the opening at the tip of the penis Circumcised men seem to have slightly lower rates of: ?Urinary tract infections ?Swelling of the opening at the tip of the penis ?Penis  cancer ?HIV and other infections that you catch during sex ?Cervical cancer in the women they have sex with Even so, in the [place], the risks of these problems are small - even in boys and men who have not been circumcised. Plus, boys and men who are not circumcised can reduce these extra risks by: ?Cleaning their penis well ?Using condoms during sex  What are the risks of circumcision?  Risks include: ?Bleeding or infection from the surgery ?Damage to or amputation of the penis ?A chance that the doctor will cut off too much or not enough of the foreskin ?A chance that sex won't feel as good later in life Only about 1 out of every 200 circumcisions leads to problems. There is also a chance that your health insurance won't pay for circumcision.  How is circumcision done in baby boys?  First, the baby gets medicine for pain relief. This might be a cream on the skin or a shot into the base of the penis. Next, the doctor cleans the baby's penis well. Then he or she uses special tools to cut off the foreskin. Finally, the doctor wraps a bandage (called gauze) around the baby's penis. If you have your baby circumcised, his doctor or nurse will give you instructions on how to care for him after the surgery. It is important that you follow those instructions carefully.        BENEFITS OF BREASTFEEDING Many women wonder if they should breastfeed. Research shows that breast milk contains the perfect balance of vitamins, protein and fat that your baby needs to grow. It also contains antibodies that help your baby's immune system to fight off viruses and bacteria and can reduce the risk of sudden  infant death syndrome (SIDS). In addition, the colostrum (a fluid secreted from the breast in the first few days after delivery) helps your newborn's digestive system to grow and function well. Breast milk is easier to digest than formula. Also, if your baby is born preterm, breast milk can help to  reduce both short- and long-term health problems. BENEFITS OF BREASTFEEDING FOR MOM . Breastfeeding causes a hormone to be released that helps the uterus to contract and return to its normal size more quickly. . It aids in postpartum weight loss, reduces risk of breast and ovarian cancer, heart disease and rheumatoid arthritis. . It decreases the amount of bleeding after the baby is born. benefits of breastfeeding for baby . Provides comfort and nutrition . Protects baby against - Obesity - Diabetes - Asthma - Childhood cancers - Heart disease - Ear infections - Diarrhea - Pneumonia - Stomach problems - Serious allergies - Skin rashes . Promotes growth and development . Reduces the risk of baby having Sudden Infant Death Syndrome (SIDS) only breastmilk for the first 6 months . Protects baby against diseases/allergies . It's the perfect amount for tiny bellies . It restores baby's energy . Provides the best nutrition for baby . Giving water or formula can make baby more likely to get sick, decrease Mom's milk supply, make baby less content with breastfeeding Skin to Skin After delivery, the staff will place your baby on your chest. This helps with the following: . Regulates baby's temperature, breathing, heart rate and blood sugar . Increases Mom's milk supply . Promotes bonding . Keeps baby and Mom calm and decreases baby's crying Rooming In Your baby will stay in your room with you for the entire time you are in the hospital. This helps with the following: . Allows Mom to learn baby's feeding cues - Fluttering eyes - Sucking on tongue or hand - Rooting (opens mouth and turns head) - Nuzzling into the breast - Bringing hand to mouth . Allows breastfeeding on demand (when your baby is ready) . Helps baby to be calm and content . Ensures a good milk supply . Prevents complications with breastfeeding . Allows parents to learn to care for baby . Allows you to request  assistance with breastfeeding Importance of a good latch . Increases milk transfer to baby - baby gets enough milk . Ensures you have enough milk for your baby . Decreases nipple soreness . Don't use pacifiers and bottles - these cause baby to suck differently than breastfeeding . Promotes continuation of breastfeeding Risks of Formula Supplementation with Breastfeeding Giving your infant formula in addition to your breast-milk EXCEPT when medically necessary can lead to: Marland Kitchen Decreases your milk supply  . Loss of confidence in yourself for providing baby's nutrition  . Engorgement and possibly mastitis  . Asthma & allergies in the baby BREASTFEEDING FAQS How long should I breastfeed my baby? It is recommended that you provide your baby with breast milk only for the first 6 months and then continue for the first year and longer as desired. During the first few weeks after birth, your baby will need to feed 8-12 times every 24 hours, or every 2-3 hours. They will likely feed for 15-30 minutes. How can I help my baby begin breastfeeding? Babies are born with an instinct to breastfeed. A healthy baby can begin breastfeeding right away without specific help. At the hospital, a nurse (or lactation consultant) will help you begin the process and will give you tips on good positioning.  It may be helpful to take a breastfeeding class before you deliver in order to know what to expect. How can I help my baby latch on? In order to assist your baby in latching-on, cup your breast in your hand and stroke your baby's lower lip with your nipple to stimulate your baby's rooting reflex. Your baby will look like he or she is yawning, at which point you should bring the baby towards your breast, while aiming the nipple at the roof of his or her mouth. Remember to bring the baby towards you and not your breast towards the baby. How can I tell if my baby is latched-on? Your baby will have all of your nipple and part  of the dark area around the nipple in his or her mouth and your baby's nose will be touching your breast. You should see or hear the baby swallowing. If the baby is not latched-on properly, start the process over. To remove the suction, insert a clean finger between your breast and the baby's mouth. Should I switch breasts during feeding? After feeding on one side, switch the baby to your other breast. If he or she does not continue feeding - that is OK. Your baby will not necessarily need to feed from both breasts in a single feeding. On the next feeding, start with the other breast for efficiency and comfort. How can I tell if my baby is hungry? When your baby is hungry, they will nuzzle against your breast, make sucking noises and tongue motions and may put their hands near their mouth. Crying is a late sign of hunger, so you should not wait until this point. When they have received enough milk, they will unlatch from the breast. Is it okay to use a pacifier? Until your baby gets the hang of breastfeeding, experts recommend limiting pacifier usage. If you have questions about this, please contact your pediatrician. What can I do to ensure proper nutrition while breastfeeding? . Make sure that you support your own health and your baby's by eating a healthy, well-balanced diet . Your provider may recommend that you continue to take your prenatal vitamin . Drink plenty of fluids. It is a good rule to drink one glass of water before or after feeding . Alcohol will remain in the breast milk for as long as it will remain in the blood stream. If you choose to have a drink, it is recommended that you wait at least 2 hours before feeding . Moderate amounts of caffeine are OK . Some over-the-counter or prescription medications are not recommended during breastfeeding. Check with your provider if you have questions What types of birth control methods are safe while breastfeeding? Progestin-only methods,  including a daily pill, an IUD, the implant and the injection are safe while breastfeeding. Methods that contain estrogen (such as combination birth control pills, the vaginal ring and the patch) should not be used during the first month of breastfeeding as these can decrease your milk supply.

## 2018-04-27 ENCOUNTER — Telehealth (HOSPITAL_COMMUNITY): Payer: Self-pay | Admitting: *Deleted

## 2018-04-27 NOTE — Telephone Encounter (Signed)
Preadmission screen  

## 2018-04-28 ENCOUNTER — Encounter (HOSPITAL_COMMUNITY): Payer: Self-pay

## 2018-05-06 ENCOUNTER — Inpatient Hospital Stay (HOSPITAL_COMMUNITY)
Admission: AD | Admit: 2018-05-06 | Discharge: 2018-05-09 | DRG: 784 | Disposition: A | Discharge status: Home / Self Care | Payer: 59 | Attending: Family Medicine | Admitting: Family Medicine

## 2018-05-06 ENCOUNTER — Inpatient Hospital Stay (HOSPITAL_COMMUNITY): Payer: 59 | Admitting: Anesthesiology

## 2018-05-06 ENCOUNTER — Encounter (HOSPITAL_COMMUNITY): Payer: Self-pay | Admitting: *Deleted

## 2018-05-06 ENCOUNTER — Encounter (HOSPITAL_COMMUNITY)
Admission: AD | Disposition: A | Discharge status: Home / Self Care | Payer: Self-pay | Source: Home / Self Care | Attending: Family Medicine

## 2018-05-06 DIAGNOSIS — Z98891 History of uterine scar from previous surgery: Secondary | ICD-10-CM

## 2018-05-06 DIAGNOSIS — D696 Thrombocytopenia, unspecified: Secondary | ICD-10-CM | POA: Diagnosis present

## 2018-05-06 DIAGNOSIS — O34211 Maternal care for low transverse scar from previous cesarean delivery: Secondary | ICD-10-CM | POA: Diagnosis not present

## 2018-05-06 DIAGNOSIS — Z3A36 36 weeks gestation of pregnancy: Secondary | ICD-10-CM | POA: Diagnosis not present

## 2018-05-06 DIAGNOSIS — Z302 Encounter for sterilization: Secondary | ICD-10-CM | POA: Diagnosis not present

## 2018-05-06 DIAGNOSIS — O34219 Maternal care for unspecified type scar from previous cesarean delivery: Secondary | ICD-10-CM | POA: Diagnosis not present

## 2018-05-06 DIAGNOSIS — O4292 Full-term premature rupture of membranes, unspecified as to length of time between rupture and onset of labor: Secondary | ICD-10-CM | POA: Diagnosis present

## 2018-05-06 DIAGNOSIS — Z3480 Encounter for supervision of other normal pregnancy, unspecified trimester: Secondary | ICD-10-CM

## 2018-05-06 DIAGNOSIS — O9912 Other diseases of the blood and blood-forming organs and certain disorders involving the immune mechanism complicating childbirth: Secondary | ICD-10-CM | POA: Diagnosis present

## 2018-05-06 DIAGNOSIS — R002 Palpitations: Secondary | ICD-10-CM | POA: Diagnosis present

## 2018-05-06 DIAGNOSIS — O42013 Preterm premature rupture of membranes, onset of labor within 24 hours of rupture, third trimester: Secondary | ICD-10-CM | POA: Diagnosis not present

## 2018-05-06 DIAGNOSIS — O9902 Anemia complicating childbirth: Secondary | ICD-10-CM | POA: Diagnosis present

## 2018-05-06 DIAGNOSIS — Z9889 Other specified postprocedural states: Secondary | ICD-10-CM

## 2018-05-06 DIAGNOSIS — D573 Sickle-cell trait: Secondary | ICD-10-CM | POA: Diagnosis present

## 2018-05-06 DIAGNOSIS — Z3A00 Weeks of gestation of pregnancy not specified: Secondary | ICD-10-CM | POA: Diagnosis not present

## 2018-05-06 DIAGNOSIS — O4290 Premature rupture of membranes, unspecified as to length of time between rupture and onset of labor, unspecified weeks of gestation: Secondary | ICD-10-CM | POA: Diagnosis present

## 2018-05-06 DIAGNOSIS — D509 Iron deficiency anemia, unspecified: Secondary | ICD-10-CM

## 2018-05-06 DIAGNOSIS — D649 Anemia, unspecified: Secondary | ICD-10-CM | POA: Diagnosis present

## 2018-05-06 LAB — CBC
HEMATOCRIT: 33.5 % — AB (ref 36.0–46.0)
Hemoglobin: 10.9 g/dL — ABNORMAL LOW (ref 12.0–15.0)
MCH: 27.3 pg (ref 26.0–34.0)
MCHC: 32.5 g/dL (ref 30.0–36.0)
MCV: 84 fL (ref 80.0–100.0)
PLATELETS: 133 10*3/uL — AB (ref 150–400)
RBC: 3.99 MIL/uL (ref 3.87–5.11)
RDW: 17.7 % — AB (ref 11.5–15.5)
WBC: 3.4 10*3/uL — ABNORMAL LOW (ref 4.0–10.5)

## 2018-05-06 LAB — TYPE AND SCREEN
ABO/RH(D): B POS
Antibody Screen: NEGATIVE

## 2018-05-06 LAB — RPR: RPR Ser Ql: NONREACTIVE

## 2018-05-06 LAB — AMNISURE RUPTURE OF MEMBRANE (ROM) NOT AT ARMC: AMNISURE: POSITIVE

## 2018-05-06 SURGERY — Surgical Case
Anesthesia: Spinal | Wound class: Clean Contaminated

## 2018-05-06 MED ORDER — BETAMETHASONE SOD PHOS & ACET 6 (3-3) MG/ML IJ SUSP
12.0000 mg | Freq: Once | INTRAMUSCULAR | Status: AC
  Administered 2018-05-06: 12 mg via INTRAMUSCULAR
  Filled 2018-05-06: qty 2

## 2018-05-06 MED ORDER — MENTHOL 3 MG MT LOZG: 1.0000 | LOZENGE | OROMUCOSAL | Status: DC | PRN

## 2018-05-06 MED ORDER — MEPERIDINE HCL 25 MG/ML IJ SOLN: 6.2500 mg | INTRAMUSCULAR | Status: DC | PRN

## 2018-05-06 MED ORDER — ACETAMINOPHEN 10 MG/ML IV SOLN: 1000.0000 mg | Freq: Once | INTRAVENOUS | Status: DC | PRN

## 2018-05-06 MED ORDER — DIPHENHYDRAMINE HCL 50 MG/ML IJ SOLN: 12.5000 mg | INTRAMUSCULAR | Status: DC | PRN

## 2018-05-06 MED ORDER — SENNOSIDES-DOCUSATE SODIUM 8.6-50 MG PO TABS
2.0000 | ORAL_TABLET | ORAL | Status: DC
  Administered 2018-05-06 – 2018-05-08 (×3): 2 via ORAL
  Filled 2018-05-06 (×3): qty 2

## 2018-05-06 MED ORDER — DEXAMETHASONE SODIUM PHOSPHATE 10 MG/ML IJ SOLN
INTRAMUSCULAR | Status: DC | PRN
  Administered 2018-05-06: 4 mg via INTRAVENOUS

## 2018-05-06 MED ORDER — SIMETHICONE 80 MG PO CHEW
80.0000 mg | CHEWABLE_TABLET | Freq: Three times a day (TID) | ORAL | Status: DC
  Administered 2018-05-06 – 2018-05-09 (×7): 80 mg via ORAL
  Filled 2018-05-06 (×6): qty 1

## 2018-05-06 MED ORDER — SIMETHICONE 80 MG PO CHEW
80.0000 mg | CHEWABLE_TABLET | ORAL | Status: DC
  Administered 2018-05-06 – 2018-05-08 (×3): 80 mg via ORAL
  Filled 2018-05-06 (×3): qty 1

## 2018-05-06 MED ORDER — DIPHENHYDRAMINE HCL 25 MG PO CAPS
25.0000 mg | ORAL_CAPSULE | ORAL | Status: DC | PRN
  Filled 2018-05-06: qty 1

## 2018-05-06 MED ORDER — OXYCODONE HCL 5 MG PO TABS: 10.0000 mg | ORAL_TABLET | ORAL | Status: DC | PRN

## 2018-05-06 MED ORDER — TETANUS-DIPHTH-ACELL PERTUSSIS 5-2.5-18.5 LF-MCG/0.5 IM SUSP: 0.5000 mL | Freq: Once | INTRAMUSCULAR | Status: DC

## 2018-05-06 MED ORDER — DEXAMETHASONE SODIUM PHOSPHATE 4 MG/ML IJ SOLN
INTRAMUSCULAR | Status: AC
  Filled 2018-05-06: qty 1

## 2018-05-06 MED ORDER — NALBUPHINE HCL 10 MG/ML IJ SOLN: 5.0000 mg | Freq: Once | INTRAMUSCULAR | Status: DC | PRN

## 2018-05-06 MED ORDER — DIPHENHYDRAMINE HCL 25 MG PO CAPS: 25.0000 mg | ORAL_CAPSULE | Freq: Four times a day (QID) | ORAL | Status: DC | PRN

## 2018-05-06 MED ORDER — ACETAMINOPHEN 325 MG PO TABS
650.0000 mg | ORAL_TABLET | ORAL | Status: DC | PRN
  Administered 2018-05-07 – 2018-05-09 (×3): 650 mg via ORAL
  Filled 2018-05-06 (×3): qty 2

## 2018-05-06 MED ORDER — NALBUPHINE HCL 10 MG/ML IJ SOLN: 5.0000 mg | INTRAMUSCULAR | Status: DC | PRN

## 2018-05-06 MED ORDER — SOD CITRATE-CITRIC ACID 500-334 MG/5ML PO SOLN
30.0000 mL | ORAL | Status: AC
  Administered 2018-05-06: 30 mL via ORAL
  Filled 2018-05-06: qty 15

## 2018-05-06 MED ORDER — BUPIVACAINE IN DEXTROSE 0.75-8.25 % IT SOLN
INTRATHECAL | Status: DC | PRN
  Administered 2018-05-06: 1.6 mL via INTRATHECAL

## 2018-05-06 MED ORDER — SODIUM CHLORIDE 0.9 % IV SOLN
500.0000 mg | INTRAVENOUS | Status: AC
  Administered 2018-05-06: 500 mg via INTRAVENOUS
  Filled 2018-05-06: qty 500

## 2018-05-06 MED ORDER — PHENYLEPHRINE 8 MG IN D5W 100 ML (0.08MG/ML) PREMIX OPTIME
INJECTION | INTRAVENOUS | Status: AC
  Filled 2018-05-06: qty 100

## 2018-05-06 MED ORDER — MEPERIDINE HCL 25 MG/ML IJ SOLN
INTRAMUSCULAR | Status: DC | PRN
  Administered 2018-05-06: 25 mg via INTRAVENOUS

## 2018-05-06 MED ORDER — LACTATED RINGERS IV SOLN
INTRAVENOUS | Status: DC
  Administered 2018-05-06: 22:00:00 via INTRAVENOUS

## 2018-05-06 MED ORDER — MORPHINE SULFATE (PF) 0.5 MG/ML IJ SOLN
INTRAMUSCULAR | Status: DC | PRN
  Administered 2018-05-06: .15 mg via INTRATHECAL

## 2018-05-06 MED ORDER — MORPHINE SULFATE (PF) 0.5 MG/ML IJ SOLN
INTRAMUSCULAR | Status: AC
  Filled 2018-05-06: qty 10

## 2018-05-06 MED ORDER — BUPIVACAINE HCL (PF) 0.25 % IJ SOLN
INTRAMUSCULAR | Status: AC
  Filled 2018-05-06: qty 30

## 2018-05-06 MED ORDER — ZOLPIDEM TARTRATE 5 MG PO TABS: 5.0000 mg | ORAL_TABLET | Freq: Every evening | ORAL | Status: DC | PRN

## 2018-05-06 MED ORDER — DIBUCAINE 1 % RE OINT: 1.0000 "application " | TOPICAL_OINTMENT | RECTAL | Status: DC | PRN

## 2018-05-06 MED ORDER — SODIUM CHLORIDE 0.9% FLUSH: 3.0000 mL | INTRAVENOUS | Status: DC | PRN

## 2018-05-06 MED ORDER — SCOPOLAMINE 1 MG/3DAYS TD PT72
1.0000 | MEDICATED_PATCH | Freq: Once | TRANSDERMAL | Status: DC
  Administered 2018-05-06: 1.5 mg via TRANSDERMAL

## 2018-05-06 MED ORDER — FERROUS SULFATE 325 (65 FE) MG PO TABS
325.0000 mg | ORAL_TABLET | Freq: Two times a day (BID) | ORAL | Status: DC
  Administered 2018-05-06 – 2018-05-09 (×5): 325 mg via ORAL
  Filled 2018-05-06 (×5): qty 1

## 2018-05-06 MED ORDER — FENTANYL CITRATE (PF) 100 MCG/2ML IJ SOLN
INTRAMUSCULAR | Status: AC
  Filled 2018-05-06: qty 2

## 2018-05-06 MED ORDER — OXYTOCIN 10 UNIT/ML IJ SOLN
INTRAVENOUS | Status: DC | PRN
  Administered 2018-05-06: 40 [IU] via INTRAVENOUS

## 2018-05-06 MED ORDER — NALOXONE HCL 0.4 MG/ML IJ SOLN: 0.4000 mg | INTRAMUSCULAR | Status: DC | PRN

## 2018-05-06 MED ORDER — CEFAZOLIN SODIUM-DEXTROSE 2-4 GM/100ML-% IV SOLN
2.0000 g | INTRAVENOUS | Status: AC
  Administered 2018-05-06: 2 g via INTRAVENOUS
  Filled 2018-05-06: qty 100

## 2018-05-06 MED ORDER — PHENYLEPHRINE 8 MG IN D5W 100 ML (0.08MG/ML) PREMIX OPTIME
INJECTION | INTRAVENOUS | Status: DC | PRN
  Administered 2018-05-06: 60 ug/min via INTRAVENOUS

## 2018-05-06 MED ORDER — SIMETHICONE 80 MG PO CHEW: 80.0000 mg | CHEWABLE_TABLET | ORAL | Status: DC | PRN

## 2018-05-06 MED ORDER — LACTATED RINGERS IV SOLN
125.0000 mL/h | INTRAVENOUS | Status: DC
  Administered 2018-05-06 (×2): 125 mL/h via INTRAVENOUS

## 2018-05-06 MED ORDER — OXYTOCIN 10 UNIT/ML IJ SOLN
INTRAMUSCULAR | Status: AC
  Filled 2018-05-06: qty 4

## 2018-05-06 MED ORDER — OXYTOCIN 40 UNITS IN LACTATED RINGERS INFUSION - SIMPLE MED: 2.5000 [IU]/h | INTRAVENOUS | Status: AC

## 2018-05-06 MED ORDER — FENTANYL CITRATE (PF) 100 MCG/2ML IJ SOLN
INTRAMUSCULAR | Status: DC | PRN
  Administered 2018-05-06: 15 ug via INTRATHECAL

## 2018-05-06 MED ORDER — IBUPROFEN 600 MG PO TABS
600.0000 mg | ORAL_TABLET | Freq: Four times a day (QID) | ORAL | Status: DC
  Administered 2018-05-06 – 2018-05-09 (×11): 600 mg via ORAL
  Filled 2018-05-06 (×10): qty 1

## 2018-05-06 MED ORDER — NALOXONE HCL 4 MG/10ML IJ SOLN
1.0000 ug/kg/h | INTRAVENOUS | Status: DC | PRN
  Filled 2018-05-06: qty 5

## 2018-05-06 MED ORDER — WITCH HAZEL-GLYCERIN EX PADS: 1.0000 "application " | MEDICATED_PAD | CUTANEOUS | Status: DC | PRN

## 2018-05-06 MED ORDER — ONDANSETRON HCL 4 MG/2ML IJ SOLN
INTRAMUSCULAR | Status: AC
  Filled 2018-05-06: qty 2

## 2018-05-06 MED ORDER — COCONUT OIL OIL: 1.0000 "application " | TOPICAL_OIL | Status: DC | PRN

## 2018-05-06 MED ORDER — SCOPOLAMINE 1 MG/3DAYS TD PT72
MEDICATED_PATCH | TRANSDERMAL | Status: AC
  Filled 2018-05-06: qty 1

## 2018-05-06 MED ORDER — ONDANSETRON HCL 4 MG/2ML IJ SOLN: 4.0000 mg | Freq: Three times a day (TID) | INTRAMUSCULAR | Status: DC | PRN

## 2018-05-06 MED ORDER — MEPERIDINE HCL 25 MG/ML IJ SOLN
INTRAMUSCULAR | Status: AC
  Filled 2018-05-06: qty 1

## 2018-05-06 MED ORDER — LACTATED RINGERS IV SOLN
INTRAVENOUS | Status: DC | PRN
  Administered 2018-05-06: 12:00:00 via INTRAVENOUS

## 2018-05-06 MED ORDER — SODIUM CHLORIDE 0.9 % IR SOLN
Status: DC | PRN
  Administered 2018-05-06: 1

## 2018-05-06 MED ORDER — ONDANSETRON HCL 4 MG/2ML IJ SOLN
INTRAMUSCULAR | Status: DC | PRN
  Administered 2018-05-06: 4 mg via INTRAVENOUS

## 2018-05-06 MED ORDER — PRENATAL MULTIVITAMIN CH
1.0000 | ORAL_TABLET | Freq: Every day | ORAL | Status: DC
  Administered 2018-05-07 – 2018-05-08 (×2): 1 via ORAL
  Filled 2018-05-06 (×2): qty 1

## 2018-05-06 MED ORDER — OXYCODONE HCL 5 MG PO TABS
5.0000 mg | ORAL_TABLET | ORAL | Status: DC | PRN
  Administered 2018-05-06: 5 mg via ORAL
  Filled 2018-05-06: qty 1

## 2018-05-06 SURGICAL SUPPLY — 35 items
BENZOIN TINCTURE PRP APPL 2/3 (GAUZE/BANDAGES/DRESSINGS) ×3 IMPLANT
CHLORAPREP W/TINT 26ML (MISCELLANEOUS) ×3 IMPLANT
CLIP FILSHIE TUBAL LIGA STRL (Clip) ×6 IMPLANT
CLOSURE STERI STRIP 1/2 X4 (GAUZE/BANDAGES/DRESSINGS) ×2 IMPLANT
CLOSURE WOUND 1/2 X4 (GAUZE/BANDAGES/DRESSINGS) ×1
CLOTH BEACON ORANGE TIMEOUT ST (SAFETY) ×3 IMPLANT
DRSG OPSITE POSTOP 4X10 (GAUZE/BANDAGES/DRESSINGS) ×3 IMPLANT
ELECT REM PT RETURN 9FT ADLT (ELECTROSURGICAL) ×3
ELECTRODE REM PT RTRN 9FT ADLT (ELECTROSURGICAL) ×1 IMPLANT
EXTRACTOR VACUUM KIWI (MISCELLANEOUS) ×3 IMPLANT
EXTRACTOR VACUUM M CUP 4 TUBE (SUCTIONS) ×2 IMPLANT
EXTRACTOR VACUUM M CUP 4' TUBE (SUCTIONS) ×1
GLOVE BIOGEL PI IND STRL 7.0 (GLOVE) ×3 IMPLANT
GLOVE BIOGEL PI INDICATOR 7.0 (GLOVE) ×6
GLOVE ECLIPSE 6.5 STRL STRAW (GLOVE) ×3 IMPLANT
GOWN STRL REUS W/ TWL LRG LVL3 (GOWN DISPOSABLE) ×2 IMPLANT
GOWN STRL REUS W/TWL LRG LVL3 (GOWN DISPOSABLE) ×4
NEEDLE HYPO 22GX1.5 SAFETY (NEEDLE) ×3 IMPLANT
NS IRRIG 1000ML POUR BTL (IV SOLUTION) ×3 IMPLANT
PAD OB MATERNITY 4.3X12.25 (PERSONAL CARE ITEMS) ×3 IMPLANT
PAD PREP 24X48 CUFFED NSTRL (MISCELLANEOUS) ×3 IMPLANT
RETRACTOR WND ALEXIS 25 LRG (MISCELLANEOUS) IMPLANT
RTRCTR WOUND ALEXIS 25CM LRG (MISCELLANEOUS)
SPONGE LAP 18X18 RF (DISPOSABLE) ×9 IMPLANT
STRIP CLOSURE SKIN 1/2X4 (GAUZE/BANDAGES/DRESSINGS) ×2 IMPLANT
SUT PLAIN 2 0 XLH (SUTURE) ×3 IMPLANT
SUT VIC AB 0 CT1 27 (SUTURE) ×2
SUT VIC AB 0 CT1 27XBRD ANBCTR (SUTURE) ×1 IMPLANT
SUT VIC AB 0 CT1 36 (SUTURE) ×6 IMPLANT
SUT VIC AB 2-0 CT1 27 (SUTURE) ×2
SUT VIC AB 2-0 CT1 TAPERPNT 27 (SUTURE) ×1 IMPLANT
SUT VIC AB 4-0 KS 27 (SUTURE) ×3 IMPLANT
SYR CONTROL 10ML LL (SYRINGE) ×3 IMPLANT
TOWEL OR 17X24 6PK STRL BLUE (TOWEL DISPOSABLE) ×9 IMPLANT
TRAY FOLEY CATH SILVER 16FR (SET/KITS/TRAYS/PACK) ×3 IMPLANT

## 2018-05-06 NOTE — Anesthesia Procedure Notes (Signed)
Spinal  Patient location during procedure: OR Start time: 05/06/2018 10:57 AM End time: 05/06/2018 11:07 AM Staffing Anesthesiologist: Freddrick March, MD Performed: anesthesiologist  Preanesthetic Checklist Completed: patient identified, surgical consent, pre-op evaluation, timeout performed, IV checked, risks and benefits discussed and monitors and equipment checked Spinal Block Patient position: sitting Prep: site prepped and draped and DuraPrep Patient monitoring: cardiac monitor, continuous pulse ox and blood pressure Approach: midline Location: L3-4 Injection technique: single-shot Needle Needle type: Pencan  Needle gauge: 24 G Needle length: 9 cm Assessment Sensory level: T6 Additional Notes Functioning IV was confirmed and monitors were applied. Sterile prep and drape, including hand hygiene and sterile gloves were used. The patient was positioned and the spine was prepped. The skin was anesthetized with lidocaine.  Free flow of clear CSF was obtained prior to injecting local anesthetic into the CSF.  The spinal needle aspirated freely following injection.  The needle was carefully withdrawn.  The patient tolerated the procedure well.

## 2018-05-06 NOTE — Transfer of Care (Signed)
Immediate Anesthesia Transfer of Care Note  Patient: Megan Coleman  Procedure(s) Performed: CESAREAN SECTION WITH BILATERAL TUBAL LIGATION (N/A )  Patient Location: PACU  Anesthesia Type:Spinal  Level of Consciousness: awake and patient cooperative  Airway & Oxygen Therapy: Patient Spontanous Breathing  Post-op Assessment: Report given to RN and Post -op Vital signs reviewed and stable  Post vital signs: Reviewed and stable  Last Vitals:  Vitals Value Taken Time  BP    Temp    Pulse    Resp    SpO2      Last Pain:  Vitals:   05/06/18 0955  TempSrc: Oral  PainSc:          Complications: No apparent anesthesia complications

## 2018-05-06 NOTE — Anesthesia Postprocedure Evaluation (Signed)
Anesthesia Post Note  Patient: Megan Coleman  Procedure(s) Performed: CESAREAN SECTION WITH BILATERAL TUBAL LIGATION (N/A )     Patient location during evaluation: Mother Baby Anesthesia Type: Spinal Level of consciousness: awake and alert Pain management: pain level controlled Vital Signs Assessment: post-procedure vital signs reviewed and stable Respiratory status: spontaneous breathing, nonlabored ventilation and respiratory function stable Cardiovascular status: stable Postop Assessment: no headache, no backache, epidural receding and able to ambulate Anesthetic complications: no    Last Vitals:  Vitals:   05/06/18 1635 05/06/18 1752  BP: 129/72   Pulse: 69   Resp: 18   Temp: 36.8 C 36.7 C  SpO2: 100%     Last Pain:  Vitals:   05/06/18 1752  TempSrc: Oral  PainSc:    Pain Goal:                 Everette Rank

## 2018-05-06 NOTE — H&P (Addendum)
Obstetric Preoperative History and Physical  Megan Coleman is a 32 y.o. G3T5176 with IUP at [redacted]w[redacted]d presenting for SROM; states occurred at 0145 with clear fluid. Feeling very intermittent ctx and not uncomfortable. Patient with h/o previous myomectomy and prior LTCS x1. Desires BTL.    FOB is a trunk driver and is on his way to the hospital. She would like to wait to proceed with surgery until he is present if possible.   Prenatal Course Source of Care: [facility] with onset of care at 12 weeks Pregnancy complications or risks: Patient Active Problem List   Diagnosis Date Noted  . Anemia 04/09/2018  . Palpitations 04/09/2018  . Headache due to injury of head and neck 01/12/2018  . Pregnancy headache, antepartum 01/12/2018  . Supervision of other normal pregnancy, antepartum 11/19/2017  . Sickle cell trait ([place]) 11/18/2017  . Thrombocytopenia ([place]) 10/29/2013  . S/P C-section 10/01/2013  . H/O myomectomy 09/23/2013  . Endometriosis of abdominal wall s/p excision [id number] 07/31/2011   She plans to breastfeed, plans to bottle feed She desires bilateral tubal ligation for postpartum contraception.   Prenatal labs and studies: ABO, Rh: B/Positive/-- (05/29 0935) Antibody: Negative (05/29 0935) Rubella: 29.70 (05/29 0935) RPR: Non Reactive (09/18 0825)  HBsAg: Negative (05/29 0935)  HIV: Non Reactive (09/18 0825)  GBS: Unknown  2 hr Glucola: Negative  Genetic screening normal Anatomy US normal  Prenatal Transfer Tool  Maternal Diabetes: No Genetic Screening: Normal Maternal Ultrasounds/Referrals: Normal Fetal Ultrasounds or other Referrals:  None Maternal Substance Abuse:  No Significant Maternal Medications:  None Significant Maternal Lab Results: None  Past Medical History:  Diagnosis Date  . Abdominal wall mass of left lower quadrant, possible endometrioma 07/31/2011  . Abscess    lower left abdomen/groin area  . Anemia   . Endometriosis of abdominal wall s/p excision  [id number] 07/31/2011  . Fibroid   . Generalized headaches   . Lymph nodes enlarged   . Sickle cell trait [facility])     Past Surgical History:  Procedure Laterality Date  . APPENDECTOMY  04/02/2009   In [place]  . CESAREAN SECTION N/A 10/01/2013   Procedure: CESAREAN SECTION;  Surgeon: Truett Mainland, DO;  Location: [place];  Service: Obstetrics;  Laterality: N/A;  . LAPAROSCOPY  08/28/2011   Procedure: LAPAROSCOPY DIAGNOSTIC;  Surgeon: Adin Hector, MD;  Location: [facility];  Service: General;  Laterality: N/A;  Diagnostic Laparoscopy with Removal of Abdominal Wall Mass  Open Ventral Wall Hernia Repair with Mesh   . MASS EXCISION  08/28/2011   Procedure: EXCISION MASS;  Surgeon: Adin Hector, MD;  Location: [facility];  Service: General;  Laterality: N/A;  Removal of Mass Abdominal Wall   . MYOMECTOMY  07/03/2006   In [place]  . VENTRAL HERNIA REPAIR  08/28/2011   Procedure: HERNIA REPAIR VENTRAL ADULT;  Surgeon: Adin Hector, MD;  Location: [facility];  Service: General;  Laterality: N/A;   Open Ventral Wall Hernia with Mesh     OB History  Gravida Para Term Preterm AB Living  3 2 2  0 0 2  SAB TAB Ectopic Multiple Live Births  0 0 0 0 2    # Outcome Date GA Lbr Len/2nd Weight Sex Delivery Anes PTL Lv  3 Current           2 Term 10/01/13 [redacted]w[redacted]d  3185 g F CS-LTranv Spinal  LIV  1 Term 2003    M Vag-Spont  N LIV  Social History   Socioeconomic History  . Marital status: Single    Spouse name: Not on file  . Number of children: Not on file  . Years of education: Not on file  . Highest education level: Not on file  Occupational History  . Not on file  Social Needs  . Financial resource strain: Not hard at all  . Food insecurity:    Worry: Never true    Inability: Never true  . Transportation needs:    Medical: No    Non-medical: Not on file  Tobacco Use  . Smoking status: Never Smoker  . Smokeless tobacco: Never Used  Substance and Sexual Activity  . Alcohol use: No  .  Drug use: No  . Sexual activity: Yes    Birth control/protection: None  Lifestyle  . Physical activity:    Days per week: Not on file    Minutes per session: Not on file  . Stress: Not at all  Relationships  . Social connections:    Talks on phone: Not on file    Gets together: Not on file    Attends religious service: Not on file    Active member of club or organization: Not on file    Attends meetings of clubs or organizations: Not on file    Relationship status: Not on file  Other Topics Concern  . Not on file  Social History Narrative   Originally from [place], [place]    History reviewed. No pertinent family history.  Medications Prior to Admission  Medication Sig Dispense Refill Last Dose  . prenatal vitamin w/FE, FA (PRENATAL 1 + 1) 27-1 MG TABS tablet Take 1 tablet by mouth daily at 12 noon.   05/05/2018 at Unknown time  . Butalbital-APAP-Caffeine 50-325-40 MG capsule Take 1-2 capsules by mouth every 6 (six) hours as needed for headache. 30 capsule 3 04/27/2018 at Unknown time  . cyclobenzaprine (FLEXERIL) 10 MG tablet TAKE 1 TABLET BY MOUTH EVERY 8 HOURS AS NEEDED FOR  MUSCLE  SPASMS (Patient taking differently: Take 10 mg by mouth every 8 (eight) hours as needed for muscle spasms. ) 30 tablet 1 04/27/2018 at Unknown time  . ferrous sulfate (FERROUSUL) 325 (65 FE) MG tablet Take 1 tablet (325 mg total) by mouth 2 (two) times daily with a meal. (Patient not taking: Reported on 05/05/2018) 60 tablet 2 Not Taking at Unknown time  . ferumoxytol (FERAHEME) 510 MG/17ML SOLN injection Inject 17 mLs (510 mg total) into the vein once for 1 dose. (Patient not taking: Reported on 05/05/2018) 17 mL 0 Not Taking at Unknown time    No Known Allergies  Review of Systems: Negative except for what is mentioned in HPI.  Physical Exam: BP 134/86 (BP Location: Right Arm)   Pulse (!) 105   Temp 97.9 F (36.6 C) (Oral)   Resp (!) 22   Ht 5\' 7"  (1.702 m)   Wt 92.9 kg   LMP 08/26/2017    BMI 32.07 kg/m   CONSTITUTIONAL: Well-developed, well-nourished female in no acute distress.  HENT:  Normocephalic, atraumatic. Oropharynx is clear and moist EYES: Conjunctivae and EOM are normal. No scleral icterus.  NECK: Normal range of motion, supple SKIN: Skin is warm and dry. No rash noted. Not diaphoretic. No erythema. [place]: Alert and oriented to person, place, and time. Normal reflexes, muscle tone coordination. No cranial nerve deficit noted. PSYCHIATRIC: Normal mood and affect. Normal behavior. CARDIOVASCULAR: Normal heart rate noted, regular rhythm RESPIRATORY: Effort and  breath sounds normal, no problems with respiration noted ABDOMEN: Soft, nontender, nondistended, gravid. Well-healed Pfannenstiel incision. PELVIC: Deferred MUSCULOSKELETAL: Normal range of motion. No edema and no tenderness. 2+ distal pulses.  FHT: 150 bpm, moderate variability, +acels, no decels  [place]: Occasional   Pertinent Labs/Studies:   No results found for this or any previous visit (from the past 72 hour(s)).  Assessment and Plan :Megan Coleman is a 32 y.o. G3P2002 at [redacted]w[redacted]d being admitted for cesarean section and BTL due to SROM with h/o myomectomy. Given preterm status, BMZ given at presentation.The risks of cesarean section discussed with the patient included but were not limited to: bleeding which may require transfusion or reoperation; infection which may require antibiotics; injury to bowel, bladder, ureters or other surrounding organs; injury to the fetus; need for additional procedures including hysterectomy in the event of a life-threatening hemorrhage; placental abnormalities wth subsequent pregnancies, incisional problems, thromboembolic phenomenon and other postoperative/anesthesia complications. The patient concurred with the proposed plan, giving informed written consent for the procedure. Patient has been NPO since 11/12 at 2000 she will remain NPO for procedure. Anesthesia and OR  aware. Preoperative prophylactic antibiotics and SCDs ordered on call to the OR. To OR when ready.    Phill Myron, D.O. OB Fellow  05/06/2018, 5:21 AM

## 2018-05-06 NOTE — Progress Notes (Signed)
To OR via stretcher

## 2018-05-06 NOTE — MAU Note (Signed)
PT SAYS SROM AT 0145.   [facility] C/S FOR 11-19- ELECTIVE.     FEELS  SOME PAIN-  UC.

## 2018-05-06 NOTE — Progress Notes (Signed)
Received sign out from Drs Randolm Idol and Keo about patient. Here for PPROM after 34 weeks with history of myomectomy and CSx1. Repeat C-section will be performed with BTL, confirmed desire for sterilization.    The risks of cesarean section were discussed with the patient including but were not limited to: bleeding which may require transfusion or reoperation; infection which may require antibiotics; injury to bowel, bladder, ureters or other surrounding organs; injury to the fetus; need for additional procedures including hysterectomy in the event of a life-threatening hemorrhage; placental abnormalities wth subsequent pregnancies, incisional problems, thromboembolic phenomenon and other postoperative/anesthesia complications.  Patient also desires permanent sterilization.  Other reversible forms of contraception were discussed with patient; she declines all other modalities. Risks of procedure discussed with patient including but not limited to: risk of regret, permanence of method, bleeding, infection, injury to surrounding organs and need for additional procedures.  Failure risk of 1-2% with increased risk of ectopic gestation if pregnancy occurs was also discussed with patient.  The patient concurred with the proposed plan, giving informed written consent for the procedures.  Patient been NPO > 8hrs she will remain NPO for procedure. Anesthesia and OR aware.  Preoperative prophylactic antibiotics and SCDs ordered on call to the OR.  To OR when ready.  Caren Macadam, MD, MPH, ABFM Attending [place] for [facility]

## 2018-05-06 NOTE — Anesthesia Preprocedure Evaluation (Addendum)
Anesthesia Evaluation  Patient identified by MRN, date of birth, ID band Patient awake    Reviewed: Allergy & Precautions, NPO status , Patient's Chart, lab work & pertinent test results  Airway Mallampati: III  TM Distance: >3 FB Neck ROM: Full    Dental no notable dental hx. (+) Teeth Intact, Dental Advisory Given   Pulmonary neg pulmonary ROS,    Pulmonary exam normal breath sounds clear to auscultation       Cardiovascular negative cardio ROS Normal cardiovascular exam Rhythm:Regular Rate:Normal     Neuro/Psych  Headaches, negative psych ROS   GI/Hepatic negative GI ROS, Neg liver ROS,   Endo/Other  negative endocrine ROS  Renal/GU negative Renal ROS  negative genitourinary   Musculoskeletal negative musculoskeletal ROS (+)   Abdominal   Peds  Hematology  (+) Blood dyscrasia, Sickle cell trait and anemia ,   Anesthesia Other Findings Repeat C/S  Reproductive/Obstetrics (+) Pregnancy                            Anesthesia Physical Anesthesia Plan  ASA: III  Anesthesia Plan: Spinal   Post-op Pain Management:    Induction:   PONV Risk Score and Plan: 2 and Treatment may vary due to age or medical condition  Airway Management Planned: Natural Airway  Additional Equipment:   Intra-op Plan:   Post-operative Plan:   Informed Consent: I have reviewed the patients History and Physical, chart, labs and discussed the procedure including the risks, benefits and alternatives for the proposed anesthesia with the patient or authorized representative who has indicated his/her understanding and acceptance.   Dental advisory given  Plan Discussed with: CRNA  Anesthesia Plan Comments:         Anesthesia Quick Evaluation

## 2018-05-06 NOTE — Op Note (Addendum)
Megan Coleman PROCEDURE DATE: 05/06/2018  PREOPERATIVE DIAGNOSES: Intrauterine pregnancy at [redacted]w[redacted]d weeks gestation; history myomectomy and prior C-section presenting with rupture of membranes; undesired fertility  POSTOPERATIVE DIAGNOSES: The same  PROCEDURE: Repeat Low Transverse Cesarean Section, Bilateral Tubal Sterilization using Filshie clips  SURGEON:  Caren Macadam   ASSISTANT: Emeterio Reeve MD  OB FELLOW:  Donell Beers MD   An experienced assistant was required given the standard of surgical care given the complexity of the case.  This assistant was needed for exposure, dissection, suctioning, retraction, instrument exchange, assisting with delivery with administration of fundal pressure, and for overall help during the procedure.   INDICATIONS: Megan Coleman is a 32 y.o. G3P2002 at [redacted]w[redacted]d here for cesarean section and bilateral tubal sterilization secondary to the indications listed under preoperative diagnoses; please see preoperative note for further details.  The risks of surgery were discussed with the patient including but were not limited to: bleeding which may require transfusion or reoperation; infection which may require antibiotics; injury to bowel, bladder, ureters or other surrounding organs; injury to the fetus; need for additional procedures including hysterectomy in the event of a life-threatening hemorrhage; placental abnormalities wth subsequent pregnancies, incisional problems, thromboembolic phenomenon and other postoperative/anesthesia complications.  Patient also desires permanent sterilization.  Other reversible forms of contraception were discussed with patient; she declines all other modalities. Risks of procedure discussed with patient including but not limited to: risk of regret, permanence of method, bleeding, infection, injury to surrounding organs and need for additional procedures.  Failure risk of 1-2% with increased risk of ectopic  gestation if pregnancy occurs was also discussed with patient.  The patient concurred with the proposed plan, giving informed written consent for the procedures.    FINDINGS:  Viable female infant in cephalic presentation. Once infant was dislodged from pelvis due to abdominal laxity fetus converted to transverse and was rotated internally to cephalic. There was difficulty delivering the fetal head Dr. Emeterio Reeve was called into the procedure. We attempted vacuum delivery and found that due to non-compliant fascia and skin the fetal head was having difficulty delivering. The fascial incision was extended with bandage scissors.   Apgars 8 and 9.  Clear amniotic fluid.  Intact placenta, three vessel cord.  Normal uterus, fallopian tubes and ovaries bilaterally. Fallopian tubes sterilized with Filshie clips bilaterally.  There was virtual obliteration of tissue layers with nearly indistinguishable fascia and peritoneum.   ANESTHESIA: Spinal INTRAVENOUS FLUIDS: 2000 ml ESTIMATED BLOOD LOSS: 555 ml URINE OUTPUT:  1600 ml SPECIMENS: Placenta sent to pathology due to PPROM COMPLICATIONS: None immediate  PROCEDURE IN DETAIL:  The patient preoperatively received intravenous antibiotics and had sequential compression devices applied to her lower extremities.   She was then taken to the operating room where spinal anesthesia was administeredand was found to be adequate. She was then placed in a dorsal supine position with a leftward tilt, and prepped and draped in a sterile manner.  A foley catheter was placed into her bladder and attached to constant gravity.  After an adequate timeout was performed, a Pfannenstiel skin incision was made with scalpel and carried through to the underlying layer of fascia which due to scarring was indistinguishable from other tissue layers. We used the scalpel to enter the abdomen and unable to identify a clear fascia plane and entered the abdominal cavity. The layer was then  extended bilaterally using  the Mayo scissors.  The uterus was apparent upon entry to the abdominal cavity. Attention  was turned to the lower uterine segment where a low transverse hysterotomy was made with a scalpel and extended bilaterally bluntly.  The infant was successfully delivered, the cord was clamped and cut and the infant was handed over to awaiting neonatology team. Uterine massage was then administered, and the placenta delivered intact with a three-vessel cord. The uterus was then cleared of clot and debris.  The hysterotomy was closed with 0 Vicryl in a running locked fashion, and an imbricating layer was also placed with 0 Vicryl. Attention was then turned to the fallopian tubes, and Filshie clips were placed about 3 cm from the cornua, with care given to incorporate the underlying mesosalpinx on both sides, allowing for bilateral tubal sterilization. The pelvis was cleared of all clot and debris. Hemostasis was confirmed on all surfaces.  The peritoneum and the muscles were reapproximated using 0 Vicryl interrupted stitches. The combined fascia/muscular layer was then closed using 0 Vicryl.  The subcutaneous layer was irrigated.  The skin was closed with a 4-0 Vicryl subcuticular stitch. The patient tolerated the procedure well. Sponge, lap, instrument and needle counts were correct x 2.  She was taken to the recovery room in stable condition.   Caren Macadam, MD, MPH, ABFM Attending [place] for [facility]

## 2018-05-06 NOTE — Progress Notes (Signed)
Anesthesia notified regarding pt Cesarean scheduled for 1015.

## 2018-05-07 LAB — CBC
HEMATOCRIT: 28.1 % — AB (ref 36.0–46.0)
HEMATOCRIT: 29.7 % — AB (ref 36.0–46.0)
HEMOGLOBIN: 9.4 g/dL — AB (ref 12.0–15.0)
Hemoglobin: 9.8 g/dL — ABNORMAL LOW (ref 12.0–15.0)
MCH: 28.3 pg (ref 26.0–34.0)
MCH: 28 pg (ref 26.0–34.0)
MCHC: 33.5 g/dL (ref 30.0–36.0)
MCHC: 33 g/dL (ref 30.0–36.0)
MCV: 84.6 fL (ref 80.0–100.0)
MCV: 84.9 fL (ref 80.0–100.0)
NRBC: 0 % (ref 0.0–0.2)
NRBC: 0 % (ref 0.0–0.2)
Platelets: 134 10*3/uL — ABNORMAL LOW (ref 150–400)
Platelets: 122 10*3/uL — ABNORMAL LOW (ref 150–400)
RBC: 3.32 MIL/uL — AB (ref 3.87–5.11)
RBC: 3.5 MIL/uL — ABNORMAL LOW (ref 3.87–5.11)
RDW: 17.6 % — ABNORMAL HIGH (ref 11.5–15.5)
RDW: 17.9 % — ABNORMAL HIGH (ref 11.5–15.5)
WBC: 8.1 10*3/uL (ref 4.0–10.5)
WBC: 7.6 10*3/uL (ref 4.0–10.5)

## 2018-05-07 LAB — BASIC METABOLIC PANEL
Anion gap: 9 (ref 5–15)
BUN: 8 mg/dL (ref 6–20)
CHLORIDE: 105 mmol/L (ref 98–111)
CO2: 22 mmol/L (ref 22–32)
Calcium: 8.8 mg/dL — ABNORMAL LOW (ref 8.9–10.3)
Creatinine, Ser: 0.78 mg/dL (ref 0.44–1.00)
GFR calc non Af Amer: >60 mL/min (ref >60)
Glucose, Bld: 107 mg/dL — ABNORMAL HIGH (ref 70–99)
POTASSIUM: 3.7 mmol/L (ref 3.5–5.1)
Sodium: 136 mmol/L (ref 135–145)

## 2018-05-07 LAB — MAGNESIUM: Magnesium: 1.8 mg/dL (ref 1.7–2.4)

## 2018-05-07 NOTE — Lactation Note (Signed)
This note was copied from a baby's chart. Lactation Consultation Note  Patient Name: Megan Coleman [id number] Date: 05/07/2018  P2, LPTI, 12 hour female infant  2 am. BF concerns: LPTI, mom with hx of sickle cell trait, anemia and c/s delivery, inverted nipples, fitted with 20 mm NS. Mom is supplementing with 10 ml of formula after BF infant. Per mom, she has been wearing breast shells but not been using DEBP. LC gave breast shells and explained how to use do inverted nipples. Mom was instructed how to use NS 20 mm by nurse and [place] ask mom to demonstrate how to apply to breast.  Mom hand expressed and colostrum present mom thought she did not have any breast milk to give infant. Mom latched infant left breast using cross cradle hold, LC lower infant's lower jaw and asked mom bring infant body closer , nose to mouth for deeper latch. Infant BF for 8 minutes. Per mom, she will try to breastfeed infant more. Plans BF first and then supplement. LC discussed LPTI behaviors. LC discussed I & O. Mom shown how to use DEBP & how to disassemble, clean, & reassemble parts. Mom made aware of O/P services, breastfeeding support groups, community resources, and our phone # for post-discharge questions.   Mom's plan:  1. Mom will BF 8 to 12 times within 24 hours including nights. 2. Mom will pump every 3 hours and give back EBM before supplementing with formula. 3. Mom will ask for assistance with BF if need.  4. Mom will continue to wear breast shells, will pre-pump prior to applying NS and latching infant to breast.  Maternal Data    Feeding    LATCH Score                   Interventions    Lactation Tools Discussed/Used     Consult Status      Megan Coleman 05/07/2018, 7:28 AM

## 2018-05-07 NOTE — Progress Notes (Addendum)
Called to room by RN for HR elevated 232. On arrival rapid response team was at bedside as well as floor Therapist, sports. Patient stated she was feeling at baseline when she was noted to have elevated HR. Per RN in room, while going over circ care when patient appeared to be shaking. When vitals were checked HR was elevated to 232. MD was paged.  On arrival pulse was measured at 110 and cardio exam was RRR, no MRG. Lungs CTAB.   HR improved with deep breathing and valsalva to 90. Patient stated she felt better as well. Patient reports that she has had a history of cardiac disorders from childhood. Said she saw a cardiologist in [place] but could not remember her diagnosis. Patient no longer sees cards.   Assessment/Plan: 1. EKG ordered  2. BMP/CBC/Mg 3. q4h vital 4. Instructed patient to follow up with PCP/cardiologist, can consider holter monitor in future 5. If further events occur, will need EKG during event

## 2018-05-07 NOTE — Progress Notes (Signed)
Received call from another nurse in patient's room stating patient c/o of palpitations. Arrived to room and saw patient's chest shaking. Vital signs showed HTN and tachycardia with normal O2 saturation. Auscultation of heart verified tachycardia with clear lung sounds. Called for charge nurse and a rapid response. Dr. Tammi Klippel arrived and listened to patient. Order for EKG was placed and patient was told to perform vagal maneuver. Heart rate returned to normal. Dr. Tammi Klippel ordered labs and Q4hr vital signs for patient. Dr. Tammi Klippel also ordered a PRN EKG for episodes of tachycardia moving forward. Patient told to hit emergency call button if another episode happens again.

## 2018-05-07 NOTE — Progress Notes (Signed)
POSTPARTUM PROGRESS NOTE  POD #1  Subjective:  Megan Coleman is a 32 y.o. G3P2002 s/p rLTCS at [redacted]w[redacted]d.  She reports she doing well. No acute events overnight. She reports she is doing well. She denies any problems with ambulating, voiding or po intake. Denies nausea or vomiting. She has passed flatus but denies BM. Pain is well controlled.  Lochia is mild.  Objective: Blood pressure 95/61, pulse (!) 58, temperature 98.2 F (36.8 C), temperature source Oral, resp. rate 16, height 5\' 7"  (1.702 m), weight 92.9 kg, last menstrual period 08/26/2017, SpO2 100 %, currently breastfeeding.  Physical Exam:  General: alert, cooperative and no distress Chest: no respiratory distress Heart:regular rate, distal pulses intact Abdomen: soft, nontender,  Uterine Fundus: firm, appropriately tender DVT Evaluation: No calf swelling or tenderness Extremities: no edema Skin: warm, dry; incision clean/dry/intact w/ honeycomb dressing in place  Recent Labs    05/06/18 0540 05/07/18 0616  HGB 10.9* 9.4*  HCT 33.5* 28.1*    Assessment/Plan: Megan Coleman is a 32 y.o. G3P2002 s/p rLTCS at [redacted]w[redacted]d for elective repeat after SROM with clear fluid.  POD#1 - Doing welll; pain well controlled. H/H appropriate (10.9>9.4)  Routine postpartum care  OOB, ambulated  SCDs for VTE prophylaxis  Anemia: asymptomatic    Contraception: s/p BTL Feeding: breast  Dispo: Plan for discharge 05/09/18.   LOS: 1 day   Caroline More, DO PGY-2 05/07/2018, 7:48 AM

## 2018-05-07 NOTE — Progress Notes (Signed)
D/w mom and dad re: r/b to circumcision and they'd like to proceed. Infant examined and looks big enough for circ at 36weeks. Nursery aware and will try and do it today.   Durene Romans MD Attending [facility] (Faculty Practice) 05/07/2018 Time: 1500

## 2018-05-08 ENCOUNTER — Encounter: Payer: 59 | Admitting: Student

## 2018-05-08 NOTE — Progress Notes (Signed)
Post-Op Day 2, RLTCS for PROM  Subjective: No complaints, up ad lib, voiding and tolerating PO, passing flatus,small lochia,.plans to breastfeed, bilateral tubal ligation Had symptomatic palpitations yesterday, Nl EKG, no more episodes Objective: Blood pressure 107/77, pulse 68, temperature 99.5 F (37.5 C), temperature source Oral, resp. rate 16, height 5\' 7"  (1.702 m), weight 92.9 kg, last menstrual period 08/26/2017, SpO2 100 %, currently breastfeeding.  Physical Exam:  General: alert, cooperative and no distress Lochia:normal flow Chest: CTAB Heart: RRR no m/r/g Abdomen: +BS, soft, nontender, dsg c/d/intact, no erythema Uterine Fundus: firm DVT Evaluation: No evidence of DVT seen on physical exam. Extremities: trace edema  Recent Labs    05/07/18 0616 05/07/18 2004  HGB 9.4* 9.8*  HCT 28.1* 29.7*    Assessment/Plan: Plan for discharge tomorrow  Cardiology consult outpt   LOS: 2 days   Christin Fudge 05/08/2018, 7:29 AM

## 2018-05-08 NOTE — Lactation Note (Signed)
This note was copied from a baby's chart. Lactation Consultation Note  Patient Name: Megan Coleman [id number] Date: 05/08/2018 Reason for consult: Follow-up assessment;Late-preterm 34-36.6wks LC entered room mom was asleep, per dad , BF is going well, mom is BF and formula feeding currently  without problems. Mom has been using the  DEBP.  Maternal Data    Feeding    LATCH Score                   Interventions    Lactation Tools Discussed/Used     Consult Status Consult Status: Follow-up Date: 05/08/18 Follow-up type: In-patient    Vicente Serene 05/08/2018, 4:00 AM

## 2018-05-09 MED ORDER — DOCUSATE SODIUM 100 MG PO CAPS: 100.0000 mg | ORAL_CAPSULE | Freq: Two times a day (BID) | ORAL | 2 refills | Status: AC | PRN

## 2018-05-09 MED ORDER — OXYCODONE HCL 5 MG PO TABS: 5.0000 mg | ORAL_TABLET | ORAL | 0 refills | Status: DC | PRN

## 2018-05-09 NOTE — Discharge Summary (Signed)
Obstetrics Discharge Summary OB/GYN Faculty Practice   Patient Name: Megan Coleman DOB: 08/29/1985 MRN: [id number]  Date of admission: 05/06/2018 Delivering MD: Caren Macadam   Date of discharge: 05/09/2018  Admitting diagnosis: 34WKS WATER BROKE Intrauterine pregnancy: [redacted]w[redacted]d     Secondary diagnosis:   Principal Problem:   Status post repeat low transverse cesarean section Active Problems:   H/O myomectomy   Thrombocytopenia ([facility])   Sickle cell trait ([place])   Supervision of other normal pregnancy, antepartum   Anemia   PROM (premature rupture of membranes)   S/P repeat low transverse C-section      Discharge diagnosis: Preterm Pregnancy Delivered                                            Postpartum procedures: BTL Complications: postpartum palpitations  Hospital course: Megan Coleman is a 32 y.o. [redacted]w[redacted]d who was admitted for PROM. Her pregnancy was complicated by h/o myomectomy, thrombocytopenia, sickle cell trait, anemia, h/o c-section. Her labor course was notable for PROM. Delivery was complicated by none. Please see delivery/op note for additional details. Her postpartum course was complicated by palpitations with normal EKG. She was breastfeeding without difficulty. By day of discharge, she was passing flatus, urinating, eating and drinking without difficulty. Her pain was well-controlled, and she was discharged home with oxycodone 5 mg #15. She will follow-up in clinic in 1 week for wound check and 4 weeks for PP visit. She was instructed to follow-up with PCP regarding palpitations.   Physical exam  Vitals:   05/08/18 2110 05/08/18 2137 05/09/18 0230 05/09/18 0548  BP: 115/69 117/77 117/73 126/78  Pulse: 78 89 76 93  Resp: 18 16 16 18   Temp: 98 F (36.7 C) 99.1 F (37.3 C) 98.6 F (37 C) 99.1 F (37.3 C)  TempSrc: Oral Oral Oral Oral  SpO2: 100% 100%    Weight:      Height:       General: well-appearing, no acute distress Lochia:  appropriate Uterine Fundus: firm Incision: Dressing is clean, dry, and intact DVT Evaluation: No evidence of DVT seen on physical exam. Labs: Lab Results  Component Value Date   WBC 7.6 05/07/2018   HGB 9.8 (L) 05/07/2018   HCT 29.7 (L) 05/07/2018   MCV 84.9 05/07/2018   PLT 122 (L) 05/07/2018   CMP Latest Ref Rng & Units 05/07/2018  Glucose 70 - 99 mg/dL 107(H)  BUN 6 - 20 mg/dL 8  Creatinine 0.44 - 1.00 mg/dL 0.78  Sodium 135 - 145 mmol/L 136  Potassium 3.5 - 5.1 mmol/L 3.7  Chloride 98 - 111 mmol/L 105  CO2 22 - 32 mmol/L 22  Calcium 8.9 - 10.3 mg/dL 8.8(L)  Total Protein 6.5 - 8.1 g/dL -  Total Bilirubin 0.3 - 1.2 mg/dL -  Alkaline Phos 38 - 126 U/L -  AST 15 - 41 U/L -  ALT 14 - 54 U/L -    Discharge instructions: Per After Visit Summary and "Baby and Me Booklet"  After visit meds:  Allergies as of 05/09/2018   No Known Allergies     Medication List    STOP taking these medications   Butalbital-APAP-Caffeine 50-325-40 MG capsule     TAKE these medications   cyclobenzaprine 10 MG tablet Commonly known as:  FLEXERIL TAKE 1 TABLET BY MOUTH EVERY 8 HOURS AS NEEDED FOR  MUSCLE  SPASMS What changed:  See the new instructions.   docusate sodium 100 MG capsule Commonly known as:  COLACE Take 1 capsule (100 mg total) by mouth 2 (two) times daily as needed.   ferrous sulfate 325 (65 FE) MG tablet Take 1 tablet (325 mg total) by mouth 2 (two) times daily with a meal.   oxyCODONE 5 MG immediate release tablet Commonly known as:  Oxy IR/ROXICODONE Take 1 tablet (5 mg total) by mouth every 4 (four) hours as needed for up to 15 doses for moderate pain or severe pain.   prenatal multivitamin Tabs tablet Take 1 tablet by mouth at bedtime.       Postpartum contraception: Tubal Ligation Diet: Routine Diet Activity: Advance as tolerated. Pelvic rest for 6 weeks.   Outpatient follow up:1 week Follow-up Appt: Future Appointments  Date Time Provider [place]  05/20/2018  1:30 PM Alvan [place]  06/09/2018  8:55 AM Manya Silvas, CNM [facility]   Follow-up Visit:No follow-ups on file.  Newborn Data: Live born female  Birth Weight: 7 lb 10.9 oz (3485 g) APGAR: 8, 9  Newborn Delivery   Birth date/time:  05/06/2018 11:41:00 Delivery type:  C-Section, Vacuum Assisted Trial of labor:  No C-section categorization:  Repeat     Baby Feeding: Breast Disposition:home with mother  Mickel Duhamel, MD OB/GYN Fellow, Faculty Practice

## 2018-05-09 NOTE — Discharge Instructions (Signed)
Please schedule an appointment with your PCP or a cardiologist regarding your palpitations  Taking care of yourself after Baby arrives 1. Vaginal Bleeding: Vaginal bleeding is common after delivery, with the amount decreasing gradually over 1-2 weeks. If the bleeding increases, is mixed with pus, or is foul-smelling, call your doctor, as this may be a sign of infection.  2. Abdominal Pain: Abdominal cramping after delivery is common, especially when you breastfeed. The same hormones responsible for letting milk down to your nipple also contract your uterus. If the pain worsens, or occurs more frequently over 48 hrs after delivery, call your doctor.  3. Fevers: After delivery you are at increased risk of developing an infection. If you have a fever, increased vaginal bleeding, foul-smelling vaginal discharge, or increased abdominal pain, call your doctor.  4. Breast Feeding: Feeding every 1.5-3 hours keeps Baby satisfied and your milk in good supply. If 3 hours have gone by and Baby is sleeping, wake him/her up to feed. Nurse for 15-20 minutes on one breast before offering the other. Breast-fed babies often lose up to 7% of their birth weight in the first few days of life, but should start gaining about an ounce per day after 4 days.   5. Mastitis (Breast infection): Breaks in the skin or bacteria passing into your breast ducts can cause an infection. If you notice a triangular shaped area on your breast that is red, warm to the touch and tender, call your doctor. It is safe for Baby and helps you to heal faster if you keep breast feeding through this infection.  6. Postpartum Depression: Postpartum depression is very common after a woman delivers because of all the hormonal changes happening in her body. If you notice that you start to feel more sad or anxious than usual or have any thoughts of hurting yourself or Baby, tell someone right away.  If you have any questions or concerns, please call your  doctor.

## 2018-05-11 ENCOUNTER — Encounter (HOSPITAL_COMMUNITY)
Admission: RE | Admit: 2018-05-11 | Discharge: 2018-05-11 | Disposition: A | Discharge status: Home / Self Care | Payer: 59 | Source: Ambulatory Visit

## 2018-05-12 ENCOUNTER — Inpatient Hospital Stay (HOSPITAL_COMMUNITY): Admit: 2018-05-12 | Payer: 59 | Admitting: Family Medicine

## 2018-05-13 ENCOUNTER — Telehealth: Payer: Self-pay

## 2018-05-13 NOTE — Telephone Encounter (Signed)
Pt sent a message to the front office stating that she is having abdominal pain.   Called pt to make sure that she is okay.  Pt stated that she is sharp pain in the right side.  Pt confirmed that taking ibuprofen and oxycodone is effective for her pain.  I advised pt to take the ibuprofen q 6 hrs to help manage the pain.  However she is one week from delivery which would not be abnormal for her to have abdominal pain.  I also advised pt that we will evaulate her a incision check next week and if her pain increases to please go to [facility].  Pt stated understanding with no further questions.

## 2018-05-15 ENCOUNTER — Encounter: Payer: 59 | Admitting: Student

## 2018-05-20 ENCOUNTER — Ambulatory Visit (INDEPENDENT_AMBULATORY_CARE_PROVIDER_SITE_OTHER): Payer: 59 | Admitting: *Deleted

## 2018-05-20 VITALS — BP 99/62 | HR 62 | Wt 180.0 lb

## 2018-05-20 DIAGNOSIS — Z5189 Encounter for other specified aftercare: Secondary | ICD-10-CM

## 2018-05-20 NOTE — Progress Notes (Addendum)
Here for wound check.  Wound clean , dry, intact without redness or edema or discharge. Removed steristrips. Instructed pain to keep wound clean and dry, call if problems or go mau. Also reviewed her pp appt with her.  Vaughan Basta, RN

## 2018-05-25 ENCOUNTER — Encounter: Payer: 59 | Admitting: Advanced Practice Midwife

## 2018-06-01 ENCOUNTER — Encounter: Payer: 59 | Admitting: Advanced Practice Midwife

## 2018-06-09 ENCOUNTER — Ambulatory Visit (INDEPENDENT_AMBULATORY_CARE_PROVIDER_SITE_OTHER): Payer: 59 | Admitting: Advanced Practice Midwife

## 2018-06-09 ENCOUNTER — Encounter: Payer: Self-pay | Admitting: Advanced Practice Midwife

## 2018-06-09 NOTE — Progress Notes (Signed)
Subjective:     Megan Coleman is a 32 y.o. female who presents for a postpartum visit. She is 5 weeks postpartum following a low cervical transverse Cesarean section. I have fully reviewed the prenatal and intrapartum course. The delivery was at 36/1 gestational weeks. Outcome: repeat cesarean section, low transverse incision. Anesthesia: spinal. Postpartum course has been uncomplicated. Baby's course has been uncomplicated. Baby is feeding by both breast and bottle - Jerlyn Ly Start. Bleeding no bleeding. Bowel function is normal. Bladder function is normal. Patient is not sexually active. Contraception method is tubal ligation. Postpartum depression screening: negative.  The following portions of the patient's history were reviewed and updated as appropriate: allergies, current medications, past family history, past medical history, past social history, past surgical history and problem list.Last Pap 2019 Nml w/ neg co-testing,  Review of Systems Mild LLQ tenderness. Denies fever, chills, bleeding, drainage or opening of incision. No urinary complaints, GI complaints.   Objective:    LMP 08/26/2017   General:  alert, cooperative, appears stated age and no distress   Breasts:  declined  Lungs: clear to auscultation bilaterally  Heart:  regular rate and rhythm, S1, S2 normal, no murmur, click, rub or gallop  Abdomen: soft, non-tender; bowel sounds normal; no masses,  no organomegaly and incision well-healed. Abd mildly TTP LLQ. No mass ro erythema   Vulva:  not evaluated  Vagina: not evaluated        Assessment:     Nml  postpartum exam. Pap smear not done at today's visit.  Mild LLQ pain likely 2/2 BTL. No evidence of infection Plan:    1. Contraception: tubal ligation 2. Continue IBU for LLQ pain.  3. Follow up in: 1 year or as needed.   4. Next Pap due 2024.

## 2018-06-09 NOTE — Patient Instructions (Signed)

## 2018-10-13 DIAGNOSIS — J019 Acute sinusitis, unspecified: Secondary | ICD-10-CM | POA: Diagnosis not present

## 2019-07-22 ENCOUNTER — Ambulatory Visit (INDEPENDENT_AMBULATORY_CARE_PROVIDER_SITE_OTHER): Payer: 59 | Admitting: Neurology

## 2019-07-22 ENCOUNTER — Encounter: Payer: Self-pay | Admitting: Neurology

## 2019-07-22 ENCOUNTER — Other Ambulatory Visit: Payer: Self-pay

## 2019-07-22 VITALS — BP 102/62 | HR 78 | Temp 97.0°F | Ht 67.0 in | Wt 186.5 lb

## 2019-07-22 DIAGNOSIS — G44321 Chronic post-traumatic headache, intractable: Secondary | ICD-10-CM | POA: Diagnosis not present

## 2019-07-22 MED ORDER — INDOMETHACIN 25 MG PO CAPS: 25.0000 mg | ORAL_CAPSULE | Freq: Three times a day (TID) | ORAL | 3 refills | Status: DC

## 2019-07-22 MED ORDER — GABAPENTIN 300 MG PO CAPS: 300.0000 mg | ORAL_CAPSULE | Freq: Three times a day (TID) | ORAL | 11 refills | Status: DC

## 2019-07-22 NOTE — Progress Notes (Signed)
PATIENT: Megan Coleman DOB: [birth year]  Chief Complaint  Patient presents with  . Headache    Reports daily headaches. She is taking amitriptyine10mg  and Fiorcet every night.  Marland Kitchen PCP    Everardo Beals, NP     HISTORICAL  Megan Coleman is a 34 year old female, seen in request by her primary care nurse practitioner Everardo Beals for evaluation of headaches,  I have reviewed and summarized the referring note from the referring physician.  She began to have headaches since she fell in her bathtub in January 2018, struck her left temporal frontal region at the edge of the tub, she denied loss of consciousness, but the second day, she had a significant swelling on her left face, presented to the emergency room next day, x-ray showed no significant abnormality, but she had persistent left-sided headache ever since, she reported daily 7 out of 10 left-sided headaches, sometimes sensitivity of left temporal pain frontal skin, she denies visual loss, no swallowing difficulty, no lateralized motor or sensory deficit.   REVIEW OF SYSTEMS: Full 14 system review of systems performed and notable only for as above All other review of systems were negative.  ALLERGIES: No Known Allergies  HOME MEDICATIONS: Current Outpatient Medications  Medication Sig Dispense Refill  . amitriptyline (ELAVIL) 10 MG tablet Take 10 mg by mouth at bedtime.    . butalbital-aspirin-caffeine (FIORINAL) 50-325-40 MG capsule Take 1 capsule by mouth as needed.     No current facility-administered medications for this visit.    PAST MEDICAL HISTORY: Past Medical History:  Diagnosis Date  . Abdominal wall mass of left lower quadrant, possible endometrioma 07/31/2011  . Abscess    lower left abdomen/groin area  . Anemia   . Endometriosis of abdominal wall s/p excision [id number] 07/31/2011  . Fibroid   . Generalized headaches   . Headache   . Lymph nodes enlarged   . Sickle cell trait ([place])      PAST SURGICAL HISTORY: Past Surgical History:  Procedure Laterality Date  . APPENDECTOMY  04/02/2009   In [place]  . CESAREAN SECTION N/A 10/01/2013   Procedure: CESAREAN SECTION;  Surgeon: Truett Mainland, DO;  Location: [facility];  Service: Obstetrics;  Laterality: N/A;  . CESAREAN SECTION WITH BILATERAL TUBAL LIGATION N/A 05/06/2018   Procedure: CESAREAN SECTION WITH BILATERAL TUBAL LIGATION;  Surgeon: Caren Macadam, MD;  Location: [facility];  Service: Obstetrics;  Laterality: N/A;  . LAPAROSCOPY  08/28/2011   Procedure: LAPAROSCOPY DIAGNOSTIC;  Surgeon: Adin Hector, MD;  Location: [facility];  Service: General;  Laterality: N/A;  Diagnostic Laparoscopy with Removal of Abdominal Wall Mass  Open Ventral Wall Hernia Repair with Mesh   . MASS EXCISION  08/28/2011   Procedure: EXCISION MASS;  Surgeon: Adin Hector, MD;  Location: [facility];  Service: General;  Laterality: N/A;  Removal of Mass Abdominal Wall   . MYOMECTOMY  07/03/2006   In [place]  . VENTRAL HERNIA REPAIR  08/28/2011   Procedure: HERNIA REPAIR VENTRAL ADULT;  Surgeon: Adin Hector, MD;  Location: [facility];  Service: General;  Laterality: N/A;   Open Ventral Wall Hernia with Mesh     FAMILY HISTORY: Family History  Problem Relation Age of Onset  . Healthy Mother   . Healthy Father     SOCIAL HISTORY: Social History   Socioeconomic History  . Marital status: Single    Spouse name: Not on file  . Number of children: 3  .  Years of education: college  . Highest education level: Bachelor's degree (e.g., BA, AB, BS)  Occupational History  . Occupation: Press photographer  Tobacco Use  . Smoking status: Never Smoker  . Smokeless tobacco: Never Used  Substance and Sexual Activity  . Alcohol use: No  . Drug use: No  . Sexual activity: Yes    Birth control/protection: None  Other Topics Concern  . Not on file  Social History Narrative   Originally from [place], [place].   Lives at home with her  children.   Right-handed.   No daily use of caffeine.   Social Determinants of Health   Financial Resource Strain:   . Difficulty of Paying Living Expenses: Not on file  Food Insecurity:   . Worried About Charity fundraiser in the Last Year: Not on file  . Ran Out of Food in the Last Year: Not on file  Transportation Needs:   . Lack of Transportation (Medical): Not on file  . Lack of Transportation (Non-Medical): Not on file  Physical Activity:   . Days of Exercise per Week: Not on file  . Minutes of Exercise per Session: Not on file  Stress:   . Feeling of Stress : Not on file  Social Connections:   . Frequency of Communication with Friends and Family: Not on file  . Frequency of Social Gatherings with Friends and Family: Not on file  . Attends Religious Services: Not on file  . Active Member of Clubs or Organizations: Not on file  . Attends Archivist Meetings: Not on file  . Marital Status: Not on file  Intimate Partner Violence:   . Fear of Current or Ex-Partner: Not on file  . Emotionally Abused: Not on file  . Physically Abused: Not on file  . Sexually Abused: Not on file     PHYSICAL EXAM   Vitals:   07/22/19 0804  BP: 102/62  Pulse: 78  Temp: (!) 97 F (36.1 C)  Weight: 186 lb 8 oz (84.6 kg)  Height: 5\' 7"  (1.702 m)    Not recorded      Body mass index is 29.21 kg/m.  PHYSICAL EXAMNIATION:  Gen: NAD, conversant, well nourised, well groomed                     Cardiovascular: Regular rate rhythm, no peripheral edema, warm, nontender. Eyes: Conjunctivae clear without exudates or hemorrhage Neck: Supple, no carotid bruits. Pulmonary: Clear to auscultation bilaterally   NEUROLOGICAL EXAM:  MENTAL STATUS: Speech:    Speech is normal; fluent and spontaneous with normal comprehension.  Cognition:     Orientation to time, place and person     Normal recent and remote memory     Normal Attention span and concentration     Normal Language,  naming, repeating,spontaneous speech     Fund of knowledge   CRANIAL NERVES: CN II: Visual fields are full to confrontation. Pupils are round equal and briskly reactive to light. CN III, IV, VI: extraocular movement are normal. No ptosis. CN V: Facial sensation is intact to light touch CN VII: Face is symmetric with normal eye closure  CN VIII: Hearing is normal to causal conversation. CN IX, X: Phonation is normal. CN XI: Head turning and shoulder shrug are intact  MOTOR: There is no pronator drift of out-stretched arms. Muscle bulk and tone are normal. Muscle strength is normal.  REFLEXES: Reflexes are 2+ and symmetric at the biceps, triceps, knees,  and ankles. Plantar responses are flexor.  SENSORY: Intact to light touch, pinprick and vibratory sensation are intact in fingers and toes.  COORDINATION: There is no trunk or limb dysmetria noted.  GAIT/STANCE: Posture is normal. Gait is steady with normal steps, base, arm swing, and turning. Heel and toe walking are normal. Tandem gait is normal.  Romberg is absent.   DIAGNOSTIC DATA (LABS, IMAGING, TESTING) - I reviewed patient records, labs, notes, testing and imaging myself where available.   ASSESSMENT AND PLAN  Megan Coleman is a 34 y.o. female   Persistent left-sided headache  -Since her fall in January 2018  Differentiation diagnosis include migraine headaches versus left hemicranial continuum  Gabapentin 300 mg 3 times daily, indomethacin 25 mg 3 times daily as needed      Marcial Pacas, M.D. Ph.D.  [facility] [address] [place], [place] [address] Ph: [phone] Fax: [phone]  CC: Everardo Beals, NP

## 2019-09-21 NOTE — Progress Notes (Addendum)
PATIENT: Megan Coleman DOB: January 05, 1986  REASON FOR VISIT: follow up HISTORY FROM: patient  HISTORY OF PRESENT ILLNESS: Today 09/22/19  HISTORY  Megan Coleman is a 34 year old female, seen in request by her primary care nurse practitioner Everardo Beals for evaluation of headaches,  I have reviewed and summarized the referring note from the referring physician.  She began to have headaches since she fell in her bathtub in January 2018, struck her left temporal frontal region at the edge of the tub, she denied loss of consciousness, but the second day, she had a significant swelling on her left face, presented to the emergency room next day, x-ray showed no significant abnormality, but she had persistent left-sided headache ever since, she reported daily 7 out of 10 left-sided headaches, sometimes sensitivity of left temporal pain frontal skin, she denies visual loss, no swallowing difficulty, no lateralized motor or sensory deficit.  Update September 22, 2019 SS: She remains on gabapentin 300 mg 3 times a day, indomethacin 25 twice daily.  Her headaches have improved intensity with medication, but are still daily.  Located on the left side temporal, frontal, occipital, can be sensitive to touch.  She denies any nausea or photophobia.  She works full-time at [employer], she has 2 kids age 20 & 59, headaches do not prevent her from doing anything.  She is also taking amitriptyline at bedtime.  She is no longer taking Fiorinal.  She has noted sinus issues, when she sneezes will have blood in tissue.  No changes to vision.  She presents today unaccompanied.  REVIEW OF SYSTEMS: Out of a complete 14 system review of symptoms, the patient complains only of the following symptoms, and all other reviewed systems are negative.  Headache  ALLERGIES: No Known Allergies  HOME MEDICATIONS: Outpatient Medications Prior to Visit  Medication Sig Dispense Refill  . amitriptyline (ELAVIL) 10  MG tablet Take 10 mg by mouth at bedtime.    . butalbital-aspirin-caffeine (FIORINAL) 50-325-40 MG capsule Take 1 capsule by mouth as needed.    . gabapentin (NEURONTIN) 300 MG capsule Take 1 capsule (300 mg total) by mouth 3 (three) times daily. 90 capsule 11  . indomethacin (INDOCIN) 25 MG capsule Take 1 capsule (25 mg total) by mouth 3 (three) times daily with meals. 90 capsule 3   No facility-administered medications prior to visit.    PAST MEDICAL HISTORY: Past Medical History:  Diagnosis Date  . Abdominal wall mass of left lower quadrant, possible endometrioma 07/31/2011  . Abscess    lower left abdomen/groin area  . Anemia   . Endometriosis of abdominal wall s/p excision [id number] 07/31/2011  . Fibroid   . Generalized headaches   . Headache   . Lymph nodes enlarged   . Sickle cell trait ([place])     PAST SURGICAL HISTORY: Past Surgical History:  Procedure Laterality Date  . APPENDECTOMY  04/02/2009   In [place]  . CESAREAN SECTION N/A 10/01/2013   Procedure: CESAREAN SECTION;  Surgeon: Truett Mainland, DO;  Location: [facility];  Service: Obstetrics;  Laterality: N/A;  . CESAREAN SECTION WITH BILATERAL TUBAL LIGATION N/A 05/06/2018   Procedure: CESAREAN SECTION WITH BILATERAL TUBAL LIGATION;  Surgeon: Caren Macadam, MD;  Location: [facility];  Service: Obstetrics;  Laterality: N/A;  . LAPAROSCOPY  08/28/2011   Procedure: LAPAROSCOPY DIAGNOSTIC;  Surgeon: Adin Hector, MD;  Location: [facility];  Service: General;  Laterality: N/A;  Diagnostic Laparoscopy with Removal of Abdominal  Wall Mass  Open Ventral Wall Hernia Repair with Mesh   . MASS EXCISION  08/28/2011   Procedure: EXCISION MASS;  Surgeon: Adin Hector, MD;  Location: [facility];  Service: General;  Laterality: N/A;  Removal of Mass Abdominal Wall   . MYOMECTOMY  07/03/2006   In [place]  . VENTRAL HERNIA REPAIR  08/28/2011   Procedure: HERNIA REPAIR VENTRAL ADULT;  Surgeon: Adin Hector, MD;  Location[facility];  Service: General;  Laterality: N/A;   Open Ventral Wall Hernia with Mesh     FAMILY HISTORY: Family History  Problem Relation Age of Onset  . Healthy Mother   . Healthy Father     SOCIAL HISTORY: Social History   Socioeconomic History  . Marital status: Single    Spouse name: Not on file  . Number of children: 3  . Years of education: college  . Highest education level: Bachelor's degree (e.g., BA, AB, BS)  Occupational History  . Occupation: Press photographer  Tobacco Use  . Smoking status: Never Smoker  . Smokeless tobacco: Never Used  Substance and Sexual Activity  . Alcohol use: No  . Drug use: No  . Sexual activity: Yes    Birth control/protection: None  Other Topics Concern  . Not on file  Social History Narrative   Originally from [place], [place].   Lives at home with her children.   Right-handed.   No daily use of caffeine.   Social Determinants of Health   Financial Resource Strain:   . Difficulty of Paying Living Expenses:   Food Insecurity:   . Worried About Charity fundraiser in the Last Year:   . Arboriculturist in the Last Year:   Transportation Needs:   . Film/video editor (Medical):   Marland Kitchen Lack of Transportation (Non-Medical):   Physical Activity:   . Days of Exercise per Week:   . Minutes of Exercise per Session:   Stress:   . Feeling of Stress :   Social Connections:   . Frequency of Communication with Friends and Family:   . Frequency of Social Gatherings with Friends and Family:   . Attends Religious Services:   . Active Member of Clubs or Organizations:   . Attends Archivist Meetings:   Marland Kitchen Marital Status:   Intimate Partner Violence:   . Fear of Current or Ex-Partner:   . Emotionally Abused:   Marland Kitchen Physically Abused:   . Sexually Abused:    PHYSICAL EXAM  Vitals:   09/22/19 0928  BP: 113/73  Pulse: 73  Temp: (!) 97.1 F (36.2 C)  Weight: 187 lb (84.8 kg)  Height: 5\' 7"  (1.702 m)   Body mass index is 29.29  kg/m.  Generalized: Well developed, in no acute distress   Neurological examination  Mentation: Alert oriented to time, place, history taking. Follows all commands speech and language fluent Cranial nerve II-XII: Pupils were equal round reactive to light. Extraocular movements were full, visual field were full on confrontational test. Facial sensation and strength were normal.  Head turning and shoulder shrug were normal and symmetric. Motor: The motor testing reveals 5 over 5 strength of all 4 extremities. Good symmetric motor tone is noted throughout.  Sensory: Sensory testing is intact to soft touch on all 4 extremities. No evidence of extinction is noted.  Coordination: Cerebellar testing reveals good finger-nose-finger and heel-to-shin bilaterally.  Gait and station: Gait is normal. Tandem gait is normal. Romberg is negative.  No drift is seen.   DIAGNOSTIC DATA (LABS, IMAGING, TESTING) - I reviewed patient records, labs, notes, testing and imaging myself where available.  Lab Results  Component Value Date   WBC 7.6 05/07/2018   HGB 9.8 (L) 05/07/2018   HCT 29.7 (L) 05/07/2018   MCV 84.9 05/07/2018   PLT 122 (L) 05/07/2018      Component Value Date/Time   NA 136 05/07/2018 2004   K 3.7 05/07/2018 2004   CL 105 05/07/2018 2004   CO2 22 05/07/2018 2004   GLUCOSE 107 (H) 05/07/2018 2004   BUN 8 05/07/2018 2004   CREATININE 0.78 05/07/2018 2004   CALCIUM 8.8 (L) 05/07/2018 2004   PROT 8.2 (H) 01/28/2016 1022   ALBUMIN 4.6 01/28/2016 1022   AST 25 01/28/2016 1022   ALT 19 01/28/2016 1022   ALKPHOS 60 01/28/2016 1022   BILITOT 0.7 01/28/2016 1022   GFRNONAA >60 05/07/2018 2004   GFRAA >60 05/07/2018 2004   No results found for: CHOL, HDL, LDLCALC, LDLDIRECT, TRIG, CHOLHDL Lab Results  Component Value Date   HGBA1C 5.6 11/19/2017   Lab Results  Component Value Date   VITAMINB12 522 04/09/2018   No results found for: TSH    ASSESSMENT AND PLAN 34 y.o. year old  female  has a past medical history of Abdominal wall mass of left lower quadrant, possible endometrioma (07/31/2011), Abscess, Anemia, Endometriosis of abdominal wall s/p excision [id number] (07/31/2011), Fibroid, Generalized headaches, Headache, Lymph nodes enlarged, and Sickle cell trait ([place]). here with:  1.  Persistent left-sided headache -Since her fall in January 2018 -Differential diagnosis includes migraine headache versus left hemicranial continuum  -Will order MRI of the brain without contrast -Continue gabapentin 300 mg twice a day -Continue indomethacin 25 mg 3 times a day as needed (has been helpful) -Need to consider ENT referral in the future, report of blood when sneezing, sinus issues? -Return in 3 months or sooner if needed  Addendum: Called patient, following Dr. Rhea Belton recommendation's take indomethacin as needed, not routinely. If MRI of the brain in unremarkable, continued headache, can consider addition medication, possibly Effexor, Inderal, CGRP.   I spent 20 minutes of face-to-face and non-face-to-face time with patient.  This included previsit chart review, lab review, study review, order entry, electronic health record documentation, patient education.    Butler Denmark, AGNP-C, DNP 09/22/2019, 9:31 AM [facility] [address] [place], [place] [address] [phone]

## 2019-09-22 ENCOUNTER — Other Ambulatory Visit: Payer: Self-pay

## 2019-09-22 ENCOUNTER — Ambulatory Visit (INDEPENDENT_AMBULATORY_CARE_PROVIDER_SITE_OTHER): Payer: 59 | Admitting: Neurology

## 2019-09-22 ENCOUNTER — Encounter: Payer: Self-pay | Admitting: Neurology

## 2019-09-22 VITALS — BP 113/73 | HR 73 | Temp 97.1°F | Ht 67.0 in | Wt 187.0 lb

## 2019-09-22 DIAGNOSIS — G44321 Chronic post-traumatic headache, intractable: Secondary | ICD-10-CM | POA: Diagnosis not present

## 2019-09-22 DIAGNOSIS — G8929 Other chronic pain: Secondary | ICD-10-CM | POA: Diagnosis not present

## 2019-09-22 DIAGNOSIS — R519 Headache, unspecified: Secondary | ICD-10-CM

## 2019-09-22 NOTE — Patient Instructions (Signed)
Check MRI of the brain  Continue gabapentin, indomethacin as needed See you back in 3 months

## 2019-09-22 NOTE — Progress Notes (Signed)
I have reviewed and agreed above plan.  Patient has been on indomethacine 25mg  tid since Jan 28th 2021.  Please also advise her to cut back it to prn to avoid long term side effect. May consider other preventive medications,such as effexor, inderal, even CGRP antagonist.

## 2019-09-23 ENCOUNTER — Telehealth: Payer: Self-pay | Admitting: Neurology

## 2019-09-23 NOTE — Telephone Encounter (Signed)
no to the covid questions MR Brain wo contrast [place]: [id number] (exp. 09/22/19 to 11/06/19). Patient is scheduled at [facility] for 09/29/19.

## 2019-09-29 ENCOUNTER — Other Ambulatory Visit: Payer: Self-pay

## 2019-09-29 ENCOUNTER — Ambulatory Visit: Payer: 59

## 2019-09-29 DIAGNOSIS — R519 Headache, unspecified: Secondary | ICD-10-CM | POA: Diagnosis not present

## 2019-09-29 DIAGNOSIS — G8929 Other chronic pain: Secondary | ICD-10-CM

## 2019-09-30 ENCOUNTER — Telehealth: Payer: Self-pay | Admitting: Neurology

## 2019-09-30 MED ORDER — TOPIRAMATE 25 MG PO TABS: ORAL_TABLET | ORAL | 3 refills | Status: DC

## 2019-09-30 NOTE — Telephone Encounter (Signed)
I called the patient.  MRI of the brain was completely normal.  We will trial her on Topamax.  Differential diagnosis includes migraine headache vs left hemicranial continuum. Per up to date this could be beneficial in both cases.  She will work up to 75 mg at bedtime.  She has had tubal ligation.  Is already taking amitriptyline, gabapentin, could consider Inderal or CGRP in the future.   IMPRESSION: 09/30/2019  Normal MRI brain (without).

## 2019-12-15 ENCOUNTER — Encounter: Payer: Self-pay | Admitting: Neurology

## 2019-12-16 ENCOUNTER — Encounter: Payer: Self-pay | Admitting: *Deleted

## 2019-12-16 NOTE — Progress Notes (Signed)
Pt was ok with this note. Will send in mychart if needs it signed will let us know.

## 2019-12-22 ENCOUNTER — Encounter: Payer: Self-pay | Admitting: Neurology

## 2019-12-22 ENCOUNTER — Ambulatory Visit (INDEPENDENT_AMBULATORY_CARE_PROVIDER_SITE_OTHER): Payer: 59 | Admitting: Neurology

## 2019-12-22 VITALS — BP 106/66 | HR 74 | Ht 67.0 in | Wt 187.4 lb

## 2019-12-22 DIAGNOSIS — G44321 Chronic post-traumatic headache, intractable: Secondary | ICD-10-CM

## 2019-12-22 MED ORDER — BUTALBITAL-APAP-CAFFEINE 50-325-40 MG PO TABS: 1.0000 | ORAL_TABLET | Freq: Four times a day (QID) | ORAL | 3 refills | Status: DC | PRN

## 2019-12-22 MED ORDER — TOPIRAMATE 50 MG PO TABS: ORAL_TABLET | ORAL | 6 refills | Status: DC

## 2019-12-22 NOTE — Progress Notes (Signed)
PATIENT: Megan Coleman DOB: 1986/01/03  REASON FOR VISIT: follow up HISTORY FROM: patient  HISTORY OF PRESENT ILLNESS: Today 12/22/19  HISTORY  Megan S Seilengahis a 34 year old female, seen in request byher primary care nurse practitioner Millsaps, Kimberlyfor evaluation of headaches,  I have reviewed and summarized the referring note from the referring physician.She began to have headaches since she fell in her bathtub in January 2018, struck her left temporal frontal region at the edge of the tub, she denied loss of consciousness, but the second day, she had a significant swelling on her left face, presented to the emergency room next day, x-ray showed no significant abnormality, but she had persistent left-sided headache ever since, she reported daily 7 out of 10 left-sided headaches, sometimes sensitivity of left temporal pain frontal skin, she denies visual loss, no swallowing difficulty, no lateralized motor or sensory deficit.  Update September 22, 2019 SS: She remains on gabapentin 300 mg 3 times a day, indomethacin 25 twice daily.  Her headaches have improved intensity with medication, but are still daily.  Located on the left side temporal, frontal, occipital, can be sensitive to touch.  She denies any nausea or photophobia.  She works full-time at [employer], she has 2 kids age 76 & 21, headaches do not prevent her from doing anything.  She is also taking amitriptyline at bedtime.  She is no longer taking Fiorinal.  She has noted sinus issues, when she sneezes will have blood in tissue.  No changes to vision.  She presents today unaccompanied.  Update December 22, 2019 SS: MRI of the brain was normal, given trial of Topamax 75 mg at bedtime.  Reports 85% improvement in headaches with Topamax.  Indicates by midafternoon, feels medication has worn off, develops headache on the left side.  Denies lacrimation, nasal drainage with headache.  Denies nausea, vomiting, blurry vision.   Is no longer taking amitriptyline or indomethacin.  With indomethacin 3 times daily, she never had complete relief of headache.  Headache does not prevent her from doing anything. Presents today unaccompanied.  REVIEW OF SYSTEMS: Out of a complete 14 system review of symptoms, the patient complains only of the following symptoms, and all other reviewed systems are negative.  Headache  ALLERGIES: No Known Allergies  HOME MEDICATIONS: Outpatient Medications Prior to Visit  Medication Sig Dispense Refill   gabapentin (NEURONTIN) 300 MG capsule Take 1 capsule (300 mg total) by mouth 3 (three) times daily. 90 capsule 11   indomethacin (INDOCIN) 25 MG capsule Take 1 capsule (25 mg total) by mouth 3 (three) times daily with meals. 90 capsule 3   amitriptyline (ELAVIL) 10 MG tablet Take 10 mg by mouth at bedtime.     butalbital-aspirin-caffeine (FIORINAL) 50-325-40 MG capsule Take 1 capsule by mouth as needed.     topiramate (TOPAMAX) 25 MG tablet Take 1 at bedtime x 3 days, then take 2 at bedtime x 3 days, then take 3 at bedtime (stay at this dose) 120 tablet 3   No facility-administered medications prior to visit.    PAST MEDICAL HISTORY: Past Medical History:  Diagnosis Date   Abdominal wall mass of left lower quadrant, possible endometrioma 07/31/2011   Abscess    lower left abdomen/groin area   Anemia    Endometriosis of abdominal wall s/p excision [id number] 07/31/2011   Fibroid    Generalized headaches    Headache    Lymph nodes enlarged    Sickle cell trait ([place])  PAST SURGICAL HISTORY: Past Surgical History:  Procedure Laterality Date   APPENDECTOMY  04/02/2009   In [place]   CESAREAN SECTION N/A 10/01/2013   Procedure: CESAREAN SECTION;  Surgeon: Truett Mainland, DO;  Location: [facility];  Service: Obstetrics;  Laterality: N/A;   CESAREAN SECTION WITH BILATERAL TUBAL LIGATION N/A 05/06/2018   Procedure: CESAREAN SECTION WITH BILATERAL TUBAL LIGATION;   Surgeon: Caren Macadam, MD;  Location: [facility];  Service: Obstetrics;  Laterality: N/A;   LAPAROSCOPY  08/28/2011   Procedure: LAPAROSCOPY DIAGNOSTIC;  Surgeon: Adin Hector, MD;  Location: [facility];  Service: General;  Laterality: N/A;  Diagnostic Laparoscopy with Removal of Abdominal Wall Mass  Open Ventral Wall Hernia Repair with Mesh    MASS EXCISION  08/28/2011   Procedure: EXCISION MASS;  Surgeon: Adin Hector, MD;  Location: [facility];  Service: General;  Laterality: N/A;  Removal of Mass Abdominal Wall    MYOMECTOMY  07/03/2006   In [place]   VENTRAL HERNIA REPAIR  08/28/2011   Procedure: HERNIA REPAIR VENTRAL ADULT;  Surgeon: Adin Hector, MD;  Location[facility];  Service: General;  Laterality: N/A;   Open Ventral Wall Hernia with Mesh     FAMILY HISTORY: Family History  Problem Relation Age of Onset   Healthy Mother    Healthy Father     SOCIAL HISTORY: Social History   Socioeconomic History   Marital status: Single    Spouse name: Not on file   Number of children: 3   Years of education: college   Highest education level: Bachelor's degree (e.g., BA, AB, BS)  Occupational History   Occupation: sales  Tobacco Use   Smoking status: Never Smoker   Smokeless tobacco: Never Used  Scientific laboratory technician Use: Never used  Substance and Sexual Activity   Alcohol use: No   Drug use: No   Sexual activity: Yes    Birth control/protection: None  Other Topics Concern   Not on file  Social History Narrative   Originally from [place], [place].   Lives at home with her children.   Right-handed.   No daily use of caffeine.   Social Determinants of Health   Financial Resource Strain:    Difficulty of Paying Living Expenses:   Food Insecurity:    Worried About Charity fundraiser in the Last Year:    Arboriculturist in the Last Year:   Transportation Needs:    Film/video editor (Medical):    Lack of Transportation  (Non-Medical):   Physical Activity:    Days of Exercise per Week:    Minutes of Exercise per Session:   Stress:    Feeling of Stress :   Social Connections:    Frequency of Communication with Friends and Family:    Frequency of Social Gatherings with Friends and Family:    Attends Religious Services:    Active Member of Clubs or Organizations:    Attends Archivist Meetings:    Marital Status:   Intimate Partner Violence:    Fear of Current or Ex-Partner:    Emotionally Abused:    Physically Abused:    Sexually Abused:    PHYSICAL EXAM  Vitals:   12/22/19 0924  BP: 106/66  Pulse: 74  Weight: 187 lb 6.4 oz (85 kg)  Height: 5\' 7"  (1.702 m)   Body mass index is 29.35 kg/m.  Generalized: Well developed, in no acute distress  Neurological examination  Mentation: Alert oriented to time, place, history taking. Follows all commands speech and language fluent Cranial nerve II-XII: Pupils were equal round reactive to light. Extraocular movements were full, visual field were full on confrontational test. Facial sensation and strength were normal. Head turning and shoulder shrug  were normal and symmetric. Motor: The motor testing reveals 5 over 5 strength of all 4 extremities. Good symmetric motor tone is noted throughout.  Sensory: Sensory testing is intact to soft touch on all 4 extremities. No evidence of extinction is noted.  Coordination: Cerebellar testing reveals good finger-nose-finger and heel-to-shin bilaterally.  Gait and station: Gait is normal.  Reflexes: Deep tendon reflexes are symmetric and normal bilaterally.   DIAGNOSTIC DATA (LABS, IMAGING, TESTING) - I reviewed patient records, labs, notes, testing and imaging myself where available.  Lab Results  Component Value Date   WBC 7.6 05/07/2018   HGB 9.8 (L) 05/07/2018   HCT 29.7 (L) 05/07/2018   MCV 84.9 05/07/2018   PLT 122 (L) 05/07/2018      Component Value Date/Time   NA 136  05/07/2018 2004   K 3.7 05/07/2018 2004   CL 105 05/07/2018 2004   CO2 22 05/07/2018 2004   GLUCOSE 107 (H) 05/07/2018 2004   BUN 8 05/07/2018 2004   CREATININE 0.78 05/07/2018 2004   CALCIUM 8.8 (L) 05/07/2018 2004   PROT 8.2 (H) 01/28/2016 1022   ALBUMIN 4.6 01/28/2016 1022   AST 25 01/28/2016 1022   ALT 19 01/28/2016 1022   ALKPHOS 60 01/28/2016 1022   BILITOT 0.7 01/28/2016 1022   GFRNONAA >60 05/07/2018 2004   GFRAA >60 05/07/2018 2004   No results found for: CHOL, HDL, LDLCALC, LDLDIRECT, TRIG, CHOLHDL Lab Results  Component Value Date   HGBA1C 5.6 11/19/2017   Lab Results  Component Value Date   VITAMINB12 522 04/09/2018   No results found for: TSH  ASSESSMENT AND PLAN 34 y.o. year old female  has a past medical history of Abdominal wall mass of left lower quadrant, possible endometrioma (07/31/2011), Abscess, Anemia, Endometriosis of abdominal wall s/p excision [id number] (07/31/2011), Fibroid, Generalized headaches, Headache, Lymph nodes enlarged, and Sickle cell trait ([place]). here with:  1.  Persistent left-sided headache -Since her fall in January 2018 -Differential diagnosis includes migraine headache versus left hemicranium continua -MRI of the brain was normal -85% improvement on Topamax, increase Topamax 50 mg in the morning, 100 mg at bedtime -Continue gabapentin 300 mg 3 times a day -Will order Fioricet, 12 tablets monthly, for severe headaches, for mild to moderate headache treat with Tylenol or Aleve -Did not continue indomethacin, even when taking 3 times a day, did not have complete resolution of headache -Follow-up in 4 months or sooner if needed, may consider addition of CGRP, also trial of triptan  I spent 20 minutes of face-to-face and non-face-to-face time with patient.  This included previsit chart review, lab review, study review, order entry, electronic health record documentation, patient education.  Butler Denmark, AGNP-C, DNP 12/22/2019, 9:55  AM [facility] [address], [place], [place] [address] [phone]

## 2019-12-22 NOTE — Patient Instructions (Addendum)
Increase Topamax 50 mg in the morning, take 100 mg at bedtime Take Fioricet for severe headache Tylenol or Aleve for mild headache Stay on gabapentin  See you back in 4 months

## 2020-01-27 ENCOUNTER — Encounter: Payer: Self-pay | Admitting: Neurology

## 2020-02-02 ENCOUNTER — Other Ambulatory Visit: Payer: Self-pay

## 2020-02-03 ENCOUNTER — Other Ambulatory Visit: Payer: Self-pay | Admitting: *Deleted

## 2020-02-03 MED ORDER — BUTALBITAL-APAP-CAFFEINE 50-325-40 MG PO TABS: 1.0000 | ORAL_TABLET | Freq: Four times a day (QID) | ORAL | 3 refills | Status: DC | PRN

## 2020-02-03 MED ORDER — TOPIRAMATE 50 MG PO TABS: ORAL_TABLET | ORAL | 3 refills | Status: DC

## 2020-02-03 NOTE — Telephone Encounter (Signed)
I called the patient, [name] cancelled her refills at [facility], she needs new ones sent to [facility], I will do this.

## 2020-02-24 ENCOUNTER — Other Ambulatory Visit: Payer: Self-pay | Admitting: *Deleted

## 2020-02-24 MED ORDER — GABAPENTIN 300 MG PO CAPS: 300.0000 mg | ORAL_CAPSULE | Freq: Three times a day (TID) | ORAL | 0 refills | Status: DC

## 2020-04-17 NOTE — Progress Notes (Signed)
PATIENT: Megan Coleman DOB: Jun 02, 1986  REASON FOR VISIT: follow up HISTORY FROM: patient  HISTORY OF PRESENT ILLNESS: Today 04/18/20  HISTORY Megan S Seilengahis a 34 year old female, seen in request byher primary care nurse practitioner Millsaps, Kimberlyfor evaluation of headaches,  I have reviewed and summarized the referring note from the referring physician.She began to have headaches since she fell in her bathtub in January 2018, struck her left temporal frontal region at the edge of the tub, she denied loss of consciousness, but the second day, she had a significant swelling on her left face, presented to the emergency room next day, x-ray showed no significant abnormality, but she had persistent left-sided headache ever since, she reported daily 7 out of 10 left-sided headaches, sometimes sensitivity of left temporal pain frontal skin, she denies visual loss, no swallowing difficulty, no lateralized motor or sensory deficit.  UpdateMarch 31, 2021 SS:She remains on gabapentin 300 mg 3 times a day, indomethacin 25 twice daily. Her headaches have improved intensity with medication, but are still daily. Located on the left side temporal, frontal,occipital, can be sensitive to touch. She denies any nausea or photophobia. She works full-time at [employer] has 2 kidsage 1 & 5,headaches do not prevent her from doing anything. She is also taking amitriptyline at bedtime. She is no longer taking Fiorinal.She has noted sinus issues, when she sneezes will have blood in tissue.No changes to vision. She presents today unaccompanied.  Update December 22, 2019 SS: MRI of the brain was normal, given trial of Topamax 75 mg at bedtime.  Reports 85% improvement in headaches with Topamax.  Indicates by midafternoon, feels medication has worn off, develops headache on the left side.  Denies lacrimation, nasal drainage with headache.  Denies nausea, vomiting, blurry  vision.  Is no longer taking amitriptyline or indomethacin.  With indomethacin 3 times daily, she never had complete relief of headache.  Headache does not prevent her from doing anything. Presents today unaccompanied.  Update April 18, 2020 SS: Remains on Topamax 50 mg am/100 mg pm, gabapentin 300 mg 3 times a day, Fioricet for severe headache.  Headaches are 90% better, still reports 2-3 severe headaches a week, on the left side, pressure, bad enough she will have to lay down, denies migraine features.  She tries not to miss work.  Is not planning pregnancy, had her tubes tied.  Fioricet worked well, never refilled, takes Tylenol for mild headaches, not daily.  Has 2 kids.  REVIEW OF SYSTEMS: Out of a complete 14 system review of symptoms, the patient complains only of the following symptoms, and all other reviewed systems are negative.  Headache  ALLERGIES: No Known Allergies  HOME MEDICATIONS: Outpatient Medications Prior to Visit  Medication Sig Dispense Refill  . butalbital-acetaminophen-caffeine (FIORICET) 50-325-40 MG tablet Take 1 tablet by mouth every 6 (six) hours as needed for headache. 12 tablet 3  . topiramate (TOPAMAX) 50 MG tablet Take 1 in the morning, take 2 at night 270 tablet 3  . gabapentin (NEURONTIN) 300 MG capsule Take 1 capsule (300 mg total) by mouth 3 (three) times daily. 270 capsule 0  . indomethacin (INDOCIN) 25 MG capsule Take 1 capsule (25 mg total) by mouth 3 (three) times daily with meals. 90 capsule 3   No facility-administered medications prior to visit.    PAST MEDICAL HISTORY: Past Medical History:  Diagnosis Date  . Abdominal wall mass of left lower quadrant, possible endometrioma 07/31/2011  . Abscess    lower  left abdomen/groin area  . Anemia   . Endometriosis of abdominal wall s/p excision [id number] 07/31/2011  . Fibroid   . Generalized headaches   . Headache   . Lymph nodes enlarged   . Sickle cell trait ([place])     PAST SURGICAL HISTORY: Past  Surgical History:  Procedure Laterality Date  . APPENDECTOMY  04/02/2009   In [place]  . CESAREAN SECTION N/A 10/01/2013   Procedure: CESAREAN SECTION;  Surgeon: Truett Mainland, DO;  Location: [facility];  Service: Obstetrics;  Laterality: N/A;  . CESAREAN SECTION WITH BILATERAL TUBAL LIGATION N/A 05/06/2018   Procedure: CESAREAN SECTION WITH BILATERAL TUBAL LIGATION;  Surgeon: Caren Macadam, MD;  Location: [facility];  Service: Obstetrics;  Laterality: N/A;  . LAPAROSCOPY  08/28/2011   Procedure: LAPAROSCOPY DIAGNOSTIC;  Surgeon: Adin Hector, MD;  Location: [facility];  Service: General;  Laterality: N/A;  Diagnostic Laparoscopy with Removal of Abdominal Wall Mass  Open Ventral Wall Hernia Repair with Mesh   . MASS EXCISION  08/28/2011   Procedure: EXCISION MASS;  Surgeon: Adin Hector, MD;  Location: [facility];  Service: General;  Laterality: N/A;  Removal of Mass Abdominal Wall   . MYOMECTOMY  07/03/2006   In [place]  . VENTRAL HERNIA REPAIR  08/28/2011   Procedure: HERNIA REPAIR VENTRAL ADULT;  Surgeon: Adin Hector, MD;  Location: [facility];  Service: General;  Laterality: N/A;   Open Ventral Wall Hernia with Mesh     FAMILY HISTORY: Family History  Problem Relation Age of Onset  . Healthy Mother   . Healthy Father     SOCIAL HISTORY: Social History   Socioeconomic History  . Marital status: Single    Spouse name: Not on file  . Number of children: 3  . Years of education: college  . Highest education level: Bachelor's degree (e.g., BA, AB, BS)  Occupational History  . Occupation: Press photographer  Tobacco Use  . Smoking status: Never Smoker  . Smokeless tobacco: Never Used  Vaping Use  . Vaping Use: Never used  Substance and Sexual Activity  . Alcohol use: No  . Drug use: No  . Sexual activity: Yes    Birth control/protection: None  Other Topics Concern  . Not on file  Social History Narrative   Originally from [place], [place].   Lives at home with  her children.   Right-handed.   No daily use of caffeine.   Social Determinants of Health   Financial Resource Strain:   . Difficulty of Paying Living Expenses: Not on file  Food Insecurity:   . Worried About Charity fundraiser in the Last Year: Not on file  . Ran Out of Food in the Last Year: Not on file  Transportation Needs:   . Lack of Transportation (Medical): Not on file  . Lack of Transportation (Non-Medical): Not on file  Physical Activity:   . Days of Exercise per Week: Not on file  . Minutes of Exercise per Session: Not on file  Stress:   . Feeling of Stress : Not on file  Social Connections:   . Frequency of Communication with Friends and Family: Not on file  . Frequency of Social Gatherings with Friends and Family: Not on file  . Attends Religious Services: Not on file  . Active Member of Clubs or Organizations: Not on file  . Attends Archivist Meetings: Not on file  . Marital Status: Not on file  Intimate Partner Violence:   . Fear of Current or Ex-Partner: Not on file  . Emotionally Abused: Not on file  . Physically Abused: Not on file  . Sexually Abused: Not on file   PHYSICAL EXAM  Vitals:   04/18/20 0932  BP: 103/67  Pulse: 72  Weight: 181 lb (82.1 kg)  Height: 5\' 7"  (1.702 m)   Body mass index is 28.35 kg/m.  Generalized: Well developed, in no acute distress  Neurological examination  Mentation: Alert oriented to time, place, history taking. Follows all commands speech and language fluent Cranial nerve II-XII: Pupils were equal round reactive to light. Extraocular movements were full, visual field were full on confrontational test. Facial sensation and strength were normal. Head turning and shoulder shrug  were normal and symmetric. Motor: The motor testing reveals 5 over 5 strength of all 4 extremities. Good symmetric motor tone is noted throughout.  Sensory: Sensory testing is intact to soft touch on all 4 extremities. No evidence of  extinction is noted.  Coordination: Cerebellar testing reveals good finger-nose-finger and heel-to-shin bilaterally.  Gait and station: Gait is normal.   Reflexes: Deep tendon reflexes are symmetric and normal bilaterally.   DIAGNOSTIC DATA (LABS, IMAGING, TESTING) - I reviewed patient records, labs, notes, testing and imaging myself where available.  Lab Results  Component Value Date   WBC 7.6 05/07/2018   HGB 9.8 (L) 05/07/2018   HCT 29.7 (L) 05/07/2018   MCV 84.9 05/07/2018   PLT 122 (L) 05/07/2018      Component Value Date/Time   NA 136 05/07/2018 2004   K 3.7 05/07/2018 2004   CL 105 05/07/2018 2004   CO2 22 05/07/2018 2004   GLUCOSE 107 (H) 05/07/2018 2004   BUN 8 05/07/2018 2004   CREATININE 0.78 05/07/2018 2004   CALCIUM 8.8 (L) 05/07/2018 2004   PROT 8.2 (H) 01/28/2016 1022   ALBUMIN 4.6 01/28/2016 1022   AST 25 01/28/2016 1022   ALT 19 01/28/2016 1022   ALKPHOS 60 01/28/2016 1022   BILITOT 0.7 01/28/2016 1022   GFRNONAA >60 05/07/2018 2004   GFRAA >60 05/07/2018 2004   No results found for: CHOL, HDL, LDLCALC, LDLDIRECT, TRIG, CHOLHDL Lab Results  Component Value Date   HGBA1C 5.6 11/19/2017   Lab Results  Component Value Date   VITAMINB12 522 04/09/2018   No results found for: TSH  ASSESSMENT AND PLAN 35 y.o. year old female  has a past medical history of Abdominal wall mass of left lower quadrant, possible endometrioma (07/31/2011), Abscess, Anemia, Endometriosis of abdominal wall s/p excision [id number] (07/31/2011), Fibroid, Generalized headaches, Headache, Lymph nodes enlarged, and Sickle cell trait ([place]). here with:  1.  Persistent left-sided headache -Since fall in January 2018 -DD includes migraine headache versus left hemicranium continuum -MRI of the brain was normal -Tried indomethacin, even when taking 3 times a day, did not have complete resolution of headache, stopped this -Continue gabapentin 300 mg 3 times a day -Continue Topamax 50 mg am/100  mg pm -Continue Fioricet as needed for acute headache that is severe, mild to moderate headache treat with Tylenol or Aleve -Start Emgality 240 mg loading dose for migraine prevention, followed by 120 mg monthly dose for maintenance (2-3 severe headaches weekly) -Follow-up in 6 months or sooner if -needed  I spent 20 minutes of face-to-face and non-face-to-face time with patient.  This included previsit chart review, lab review, study review, order entry, electronic health record documentation, patient education.  Butler Denmark, AGNP-C, DNP  04/18/2020, 9:54 AM [facility] [address] [place], [place] [address] [phone]

## 2020-04-18 ENCOUNTER — Encounter: Payer: Self-pay | Admitting: Neurology

## 2020-04-18 ENCOUNTER — Telehealth: Payer: Self-pay | Admitting: Neurology

## 2020-04-18 ENCOUNTER — Ambulatory Visit (INDEPENDENT_AMBULATORY_CARE_PROVIDER_SITE_OTHER): Payer: 59 | Admitting: Neurology

## 2020-04-18 VITALS — BP 103/67 | HR 72 | Ht 67.0 in | Wt 181.0 lb

## 2020-04-18 DIAGNOSIS — G44321 Chronic post-traumatic headache, intractable: Secondary | ICD-10-CM

## 2020-04-18 MED ORDER — EMGALITY 120 MG/ML ~~LOC~~ SOAJ: 120.0000 mg | SUBCUTANEOUS | 11 refills | Status: DC

## 2020-04-18 MED ORDER — GABAPENTIN 300 MG PO CAPS: 300.0000 mg | ORAL_CAPSULE | Freq: Three times a day (TID) | ORAL | 3 refills | Status: DC

## 2020-04-18 MED ORDER — EMGALITY 120 MG/ML ~~LOC~~ SOAJ: 240.0000 mg | Freq: Once | SUBCUTANEOUS | 0 refills | Status: AC

## 2020-04-18 NOTE — Telephone Encounter (Signed)
Received PA request for Emgality. PA was started via [email or web address]. Key is [id number]. Per CMM, will receive a determination within the next 72 hours. Will check back later for determination.

## 2020-04-18 NOTE — Patient Instructions (Signed)
Start Emgality 240 mg monthly injection as loading dose for migraine prevention, 30 days later do the Emgality 120 mg monthly injection, this is the maintenance dosing every 30 days  Continue Topamax, gabapentin   Take Fioricet for only severe headaches, no more than twice days a week   Take Tylenol or Aleve for milder headaches  See you back in 6 months

## 2020-04-25 NOTE — Telephone Encounter (Signed)
Received determination from [facility]. They have denied Emgality. They will approve Emgality once they have documentation that she is having at least 8 migraine days a month.   I reviewed her last OV note. It mentioned 2-3 severe headaches a week but not bad enough to classify them as migraines.   Would you like for me to try to appeal the denial?

## 2020-04-26 NOTE — Telephone Encounter (Signed)
Appeal letter has been typed, signed and faxed back to [facility]. Will await their decision.

## 2020-04-26 NOTE — Telephone Encounter (Signed)
Yes, please appeal, severe headaches are migraines, are left sided, have responded well to migraine medications such as Topamax. Migraines are 2-3 a week.

## 2020-05-23 ENCOUNTER — Encounter: Payer: Self-pay | Admitting: Neurology

## 2020-10-16 NOTE — Progress Notes (Signed)
PATIENT: Megan Coleman DOB: [birth year]  REASON FOR VISIT: follow up HISTORY FROM: patient  HISTORY OF PRESENT ILLNESS: Today 10/17/20  HISTORY Megan S Seilengahis a 35 year old female, seen in request byher primary care nurse practitioner Millsaps, Kimberlyfor evaluation of headaches,  I have reviewed and summarized the referring note from the referring physician.She began to have headaches since she fell in her bathtub in January 2018, struck her left temporal frontal region at the edge of the tub, she denied loss of consciousness, but the second day, she had a significant swelling on her left face, presented to the emergency room next day, x-ray showed no significant abnormality, but she had persistent left-sided headache ever since, she reported daily 7 out of 10 left-sided headaches, sometimes sensitivity of left temporal pain frontal skin, she denies visual loss, no swallowing difficulty, no lateralized motor or sensory deficit.  UpdateMarch 31, 2021 SS:She remains on gabapentin 300 mg 3 times a day, indomethacin 25 twice daily. Her headaches have improved intensity with medication, but are still daily. Located on the left side temporal, frontal,occipital, can be sensitive to touch. She denies any nausea or photophobia. She works full-time at [employer] has 2 kidsage 1 & 5,headaches do not prevent her from doing anything. She is also taking amitriptyline at bedtime. She is no longer taking Fiorinal.She has noted sinus issues, when she sneezes will have blood in tissue.No changes to vision. She presents today unaccompanied.  Update December 22, 2019 SS: MRI of the brain was normal, given trial of Topamax 75 mg at bedtime.  Reports 85% improvement in headaches with Topamax.  Indicates by midafternoon, feels medication has worn off, develops headache on the left side.  Denies lacrimation, nasal drainage with headache.  Denies nausea, vomiting, blurry  vision.  Is no longer taking amitriptyline or indomethacin.  With indomethacin 3 times daily, she never had complete relief of headache.  Headache does not prevent her from doing anything. Presents today unaccompanied.  Update April 18, 2020 SS: Remains on Topamax 50 mg am/100 mg pm, gabapentin 300 mg 3 times a day, Fioricet for severe headache.  Headaches are 90% better, still reports 2-3 severe headaches a week, on the left side, pressure, bad enough she will have to lay down, denies migraine features.  She tries not to miss work.  Is not planning pregnancy, had her tubes tied.  Fioricet worked well, never refilled, takes Tylenol for mild headaches, not daily.  Has 2 kids.  Update October 17, 2020 SS: Here today, continues to report daily headache, to the left side from frontal to occipital, feels tender to touch.  2 to 3 days a week the headaches are more significant.  Does not miss work, take Tylenol for headache.  Is only taking Topamax 100 mg at bedtime, gabapentin 300 mg twice daily.  Is worried about being on too much medication.  Emgality was ordered at last visit, not sure if approved?  Is seeing PCP for irregular heartbeat. H/a develops throughout the day.  REVIEW OF SYSTEMS: Out of a complete 14 system review of symptoms, the patient complains only of the following symptoms, and all other reviewed systems are negative.  Headache  ALLERGIES: No Known Allergies  HOME MEDICATIONS: Outpatient Medications Prior to Visit  Medication Sig Dispense Refill  . gabapentin (NEURONTIN) 300 MG capsule Take 1 capsule (300 mg total) by mouth 3 (three) times daily. 270 capsule 3  . topiramate (TOPAMAX) 50 MG tablet Take 1 in the morning, take  2 at night 270 tablet 3  . butalbital-acetaminophen-caffeine (FIORICET) 50-325-40 MG tablet Take 1 tablet by mouth every 6 (six) hours as needed for headache. 12 tablet 3  . Galcanezumab-gnlm (EMGALITY) 120 MG/ML SOAJ Inject 120 mg into the skin every 30 (thirty)  days. 1.12 mL 11   No facility-administered medications prior to visit.    PAST MEDICAL HISTORY: Past Medical History:  Diagnosis Date  . Abdominal wall mass of left lower quadrant, possible endometrioma 07/31/2011  . Abscess    lower left abdomen/groin area  . Anemia   . Endometriosis of abdominal wall s/p excision [id number] 07/31/2011  . Fibroid   . Generalized headaches   . Headache   . Lymph nodes enlarged   . Sickle cell trait ([place])     PAST SURGICAL HISTORY: Past Surgical History:  Procedure Laterality Date  . APPENDECTOMY  04/02/2009   In [place]  . CESAREAN SECTION N/A 10/01/2013   Procedure: CESAREAN SECTION;  Surgeon: Truett Mainland, DO;  Location: [facility];  Service: Obstetrics;  Laterality: N/A;  . CESAREAN SECTION WITH BILATERAL TUBAL LIGATION N/A 05/06/2018   Procedure: CESAREAN SECTION WITH BILATERAL TUBAL LIGATION;  Surgeon: Caren Macadam, MD;  Location: [facility];  Service: Obstetrics;  Laterality: N/A;  . LAPAROSCOPY  08/28/2011   Procedure: LAPAROSCOPY DIAGNOSTIC;  Surgeon: Adin Hector, MD;  Location: [facility];  Service: General;  Laterality: N/A;  Diagnostic Laparoscopy with Removal of Abdominal Wall Mass  Open Ventral Wall Hernia Repair with Mesh   . MASS EXCISION  08/28/2011   Procedure: EXCISION MASS;  Surgeon: Adin Hector, MD;  Location: [facility];  Service: General;  Laterality: N/A;  Removal of Mass Abdominal Wall   . MYOMECTOMY  07/03/2006   In [place]  . VENTRAL HERNIA REPAIR  08/28/2011   Procedure: HERNIA REPAIR VENTRAL ADULT;  Surgeon: Adin Hector, MD;  Location: [facility];  Service: General;  Laterality: N/A;   Open Ventral Wall Hernia with Mesh     FAMILY HISTORY: Family History  Problem Relation Age of Onset  . Healthy Mother   . Healthy Father     SOCIAL HISTORY: Social History   Socioeconomic History  . Marital status: Single    Spouse name: Not on file  . Number of children: 3  . Years of education: college  .  Highest education level: Bachelor's degree (e.g., BA, AB, BS)  Occupational History  . Occupation: Press photographer  Tobacco Use  . Smoking status: Never Smoker  . Smokeless tobacco: Never Used  Vaping Use  . Vaping Use: Never used  Substance and Sexual Activity  . Alcohol use: No  . Drug use: No  . Sexual activity: Yes    Birth control/protection: None  Other Topics Concern  . Not on file  Social History Narrative   Originally from [place], [place].   Lives at home with her children.   Right-handed.   No daily use of caffeine.   Social Determinants of Health   Financial Resource Strain: Not on file  Food Insecurity: Not on file  Transportation Needs: Not on file  Physical Activity: Not on file  Stress: Not on file  Social Connections: Not on file  Intimate Partner Violence: Not on file   PHYSICAL EXAM  Vitals:   10/17/20 0942  BP: (!) 100/57  Pulse: 76  Weight: 166 lb (75.3 kg)  Height: 5' 6"  (1.676 m)   Body mass index is 26.79 kg/m.  Generalized:  Well developed, in no acute distress  Neurological examination  Mentation: Alert oriented to time, place, history taking. Follows all commands speech and language fluent Cranial nerve II-XII: Pupils were equal round reactive to light. Extraocular movements were full, visual field were full on confrontational test. Facial sensation and strength were normal. Head turning and shoulder shrug  were normal and symmetric. Motor: The motor testing reveals 5 over 5 strength of all 4 extremities. Good symmetric motor tone is noted throughout.  Sensory: Sensory testing is intact to soft touch on all 4 extremities. No evidence of extinction is noted.  Coordination: Cerebellar testing reveals good finger-nose-finger and heel-to-shin bilaterally.  Gait and station: Gait is normal.   Reflexes: Deep tendon reflexes are symmetric and normal bilaterally.   DIAGNOSTIC DATA (LABS, IMAGING, TESTING) - I reviewed patient records, labs, notes,  testing and imaging myself where available.  Lab Results  Component Value Date   WBC 7.6 05/07/2018   HGB 9.8 (L) 05/07/2018   HCT 29.7 (L) 05/07/2018   MCV 84.9 05/07/2018   PLT 122 (L) 05/07/2018      Component Value Date/Time   NA 136 05/07/2018 2004   K 3.7 05/07/2018 2004   CL 105 05/07/2018 2004   CO2 22 05/07/2018 2004   GLUCOSE 107 (H) 05/07/2018 2004   BUN 8 05/07/2018 2004   CREATININE 0.78 05/07/2018 2004   CALCIUM 8.8 (L) 05/07/2018 2004   PROT 8.2 (H) 01/28/2016 1022   ALBUMIN 4.6 01/28/2016 1022   AST 25 01/28/2016 1022   ALT 19 01/28/2016 1022   ALKPHOS 60 01/28/2016 1022   BILITOT 0.7 01/28/2016 1022   GFRNONAA >60 05/07/2018 2004   GFRAA >60 05/07/2018 2004   No results found for: CHOL, HDL, LDLCALC, LDLDIRECT, TRIG, CHOLHDL Lab Results  Component Value Date   HGBA1C 5.6 11/19/2017   Lab Results  Component Value Date   VITAMINB12 522 04/09/2018   No results found for: TSH  ASSESSMENT AND PLAN 35 y.o. year old female  has a past medical history of Abdominal wall mass of left lower quadrant, possible endometrioma (07/31/2011), Abscess, Anemia, Endometriosis of abdominal wall s/p excision [id number] (07/31/2011), Fibroid, Generalized headaches, Headache, Lymph nodes enlarged, and Sickle cell trait ([place]). here with:  1.  Persistent left-sided headache -Since fall in January 2018 -DD includes migraine headache versus left hemicranium continuum -MRI of the brain was normal -Tried indomethacin, even when taking 3 times a day, did not have complete resolution of headache, stopped this -Continue gabapentin 300 mg 3 times a day -Continue Topamax 50 mg am/100 mg pm -Continue Tylenol for headache, only takes 2-3 days a week -Start Emgality 240 mg loading dose for migraine prevention, followed by 120 mg monthly dose for maintenance (2-3 severe headaches weekly), encouraged to reach out my chart message if cannot fill, will need PA -Check ESR, CRP due to persistent  left-sided headache, ensure not inflammatory process -Follow-up in 6 months or sooner if -needed  I spent 20 minutes of face-to-face and non-face-to-face time with patient.  This included previsit chart review, lab review, study review, order entry, electronic health record documentation, patient education.  Butler Denmark, AGNP-C, DNP 10/17/2020, 9:59 AM [facility] [address], [place], [place] [address] [phone]

## 2020-10-17 ENCOUNTER — Ambulatory Visit (INDEPENDENT_AMBULATORY_CARE_PROVIDER_SITE_OTHER): Payer: 59 | Admitting: Neurology

## 2020-10-17 ENCOUNTER — Encounter: Payer: Self-pay | Admitting: Neurology

## 2020-10-17 VITALS — BP 100/57 | HR 76 | Ht 66.0 in | Wt 166.0 lb

## 2020-10-17 DIAGNOSIS — G44321 Chronic post-traumatic headache, intractable: Secondary | ICD-10-CM | POA: Diagnosis not present

## 2020-10-17 MED ORDER — TOPIRAMATE 50 MG PO TABS: ORAL_TABLET | ORAL | 3 refills | Status: DC

## 2020-10-17 MED ORDER — EMGALITY 120 MG/ML ~~LOC~~ SOAJ: 240.0000 mg | Freq: Once | SUBCUTANEOUS | 0 refills | Status: AC

## 2020-10-17 MED ORDER — EMGALITY 120 MG/ML ~~LOC~~ SOAJ: 120.0000 mg | SUBCUTANEOUS | 11 refills | Status: DC

## 2020-10-17 NOTE — Patient Instructions (Signed)
Will start Emgality, initial injection 240 mg for loading dose, followed 30 days later by 120 mg injection this is the maintenance dosing every 30 days  Keep other medications  Give the emgality 3-4 months to see full benefit, send me a progress report at that time in my chart  See you back in 6 months

## 2020-10-18 LAB — C-REACTIVE PROTEIN: CRP: <1 mg/L (ref 0–10)

## 2020-10-18 LAB — SEDIMENTATION RATE: Sed Rate: 3 mm/hr (ref 0–32)

## 2020-10-19 ENCOUNTER — Telehealth: Payer: Self-pay

## 2020-10-19 ENCOUNTER — Other Ambulatory Visit: Payer: Self-pay | Admitting: Neurology

## 2020-10-19 NOTE — Telephone Encounter (Signed)
Pt verified by name and DOB,  normal results given per provider, pt voiced understanding all question answered.  She has not picked up medication yet pharmacy is out

## 2020-10-19 NOTE — Telephone Encounter (Signed)
-----   Message from Suzzanne Cloud, NP sent at 10/18/2020  5:49 AM EDT ----- Sent my chart message: Suzanna, Blood work is normal. Let me know how the injection medication goes! Judson Roch

## 2020-11-21 ENCOUNTER — Telehealth: Payer: Self-pay | Admitting: *Deleted

## 2020-11-21 ENCOUNTER — Encounter: Payer: Self-pay | Admitting: Neurology

## 2020-11-21 NOTE — Telephone Encounter (Addendum)
Initiated Key # for [employer] (Key: [id number]). [id number], determination pending. Received approval for emgality 11-21-20 to 11-20-21 [employer] REF # [id number].  (53ml per 30 days)

## 2020-12-28 ENCOUNTER — Other Ambulatory Visit: Payer: Self-pay | Admitting: *Deleted

## 2020-12-28 ENCOUNTER — Encounter: Payer: Self-pay | Admitting: Neurology

## 2020-12-28 MED ORDER — EMGALITY 120 MG/ML ~~LOC~~ SOAJ: 120.0000 mg | SUBCUTANEOUS | 3 refills | Status: DC

## 2021-01-05 ENCOUNTER — Encounter: Payer: Self-pay | Admitting: Neurology

## 2021-01-05 ENCOUNTER — Other Ambulatory Visit: Payer: Self-pay | Admitting: *Deleted

## 2021-01-05 MED ORDER — EMGALITY 120 MG/ML ~~LOC~~ SOAJ: 120.0000 mg | SUBCUTANEOUS | 0 refills | Status: DC

## 2021-01-15 ENCOUNTER — Other Ambulatory Visit: Payer: Self-pay | Admitting: *Deleted

## 2021-01-15 MED ORDER — EMGALITY 120 MG/ML ~~LOC~~ SOAJ: 120.0000 mg | SUBCUTANEOUS | 0 refills | Status: DC

## 2021-04-30 ENCOUNTER — Ambulatory Visit (INDEPENDENT_AMBULATORY_CARE_PROVIDER_SITE_OTHER): Payer: 59 | Admitting: Neurology

## 2021-04-30 ENCOUNTER — Encounter: Payer: Self-pay | Admitting: Neurology

## 2021-04-30 VITALS — BP 98/62 | HR 60 | Ht 66.0 in | Wt 168.5 lb

## 2021-04-30 DIAGNOSIS — R519 Headache, unspecified: Secondary | ICD-10-CM | POA: Diagnosis not present

## 2021-04-30 DIAGNOSIS — G44321 Chronic post-traumatic headache, intractable: Secondary | ICD-10-CM | POA: Diagnosis not present

## 2021-04-30 DIAGNOSIS — G8929 Other chronic pain: Secondary | ICD-10-CM | POA: Diagnosis not present

## 2021-04-30 MED ORDER — TOPIRAMATE 100 MG PO TABS: 100.0000 mg | ORAL_TABLET | Freq: Two times a day (BID) | ORAL | 3 refills | Status: DC

## 2021-04-30 MED ORDER — EMGALITY 120 MG/ML ~~LOC~~ SOAJ: 120.0000 mg | SUBCUTANEOUS | 3 refills | Status: DC

## 2021-04-30 MED ORDER — GABAPENTIN 300 MG PO CAPS: 300.0000 mg | ORAL_CAPSULE | Freq: Three times a day (TID) | ORAL | 3 refills | Status: DC

## 2021-04-30 NOTE — Progress Notes (Signed)
ASSESSMENT AND PLAN 35 y.o. year old female   Persistent left-sided headache -Since fall in January 2018 -DD includes migraine headache versus left hemicranium continuum -MRI of the brain was normal, ESR C-reactive protein was normal -Tried indomethacin, even when taking 3 times a day, did not have complete resolution of headache, stopped this Doing much better now with current combination Emgality 120 mg monthly, gabapentin 300 mg 3 times a day, Topamax 150 mg daily, still complains of mild degree persistent left side discomfort, tolerating medication well, will increase Topamax to 100 twice a day  Aleve as needed for worsening headache Return to clinic in 6 months, If she continues to do well, may consider tapering down the medicine to lower dose, first taper off Topamax to avoid the side effect,   DIAGNOSTIC DATA (LABS, IMAGING, TESTING) - I reviewed patient records, labs, notes, testing and imaging myself where available.  MRI of the brain without contrast April 2022 was normal Normal ESR C-reactive protein  HISTORY OF PRESENT ILLNESS:   Megan Coleman is a 35 year old female, seen in request by her primary care nurse practitioner Everardo Beals for evaluation of headaches,   I have reviewed and summarized the referring note from the referring physician.  She began to have headaches since she fell in her bathtub in January 2018, struck her left temporal frontal region at the edge of the tub, she denied loss of consciousness, but the second day, she had a significant swelling on her left face, presented to the emergency room next day, x-ray showed no significant abnormality, but she had persistent left-sided headache ever since, she reported daily 7 out of 10 left-sided headaches, sometimes sensitivity of left temporal pain frontal skin, she denies visual loss, no swallowing difficulty, no lateralized motor or sensory deficit.  Update April 30, 2021 She continue has  mild persistent left-sided pressure sensation, over the past few months, she was started on titrating dose of Emgality 120 mg monthly, gabapentin 300 mg 3 times a day, Topamax 150 mg daily, has helped her headache  Previously she was given a trial of indomethacin 25 mg 3 times a day without helping her headaches,  Personally reviewed MRI of the brain April 2022 that was normal   Laboratory evaluation showed normal ESR C-reactive protein,  PHYSICAL EXAM  Vitals:   04/30/21 1116  BP: 98/62  Pulse: 60  Weight: 168 lb 8 oz (76.4 kg)  Height: 5' 6"  (1.676 m)   Body mass index is 27.2 kg/m.   PHYSICAL EXAMNIATION:  Gen: NAD, conversant, well nourised, well groomed                     Cardiovascular: Regular rate rhythm, no peripheral edema, warm, nontender. Eyes: Conjunctivae clear without exudates or hemorrhage Neck: Supple, no carotid bruits. Pulmonary: Clear to auscultation bilaterally   NEUROLOGICAL EXAM:  MENTAL STATUS: Speech/Cognition: Awake, alert, normal speech, oriented to history taking and casual conversation.  CRANIAL NERVES: CN II: Visual fields are full to confrontation.  Pupils are round equal and briskly reactive to light. CN III, IV, VI: extraocular movement are normal. No ptosis. CN V: Facial sensation is intact to light touch. CN VII: Face is symmetric with normal eye closure and smile.  Persistent bilateral static ptosis, excessive movement of bilateral frontalis muscles CN VIII: Hearing is normal to casual conversation CN IX, X: Palate elevates symmetrically. Phonation is normal. CN XI: Head turning and shoulder shrug are intact   MOTOR:  Muscle bulk and tone are normal. Muscle strength is normal.  REFLEXES: Reflexes are 2  and symmetric at the biceps, triceps, knees and ankles. Plantar responses are flexor.  SENSORY: Intact to light touch, pinprick, positional and vibratory sensation at fingers and toes.  COORDINATION: There is no trunk or limb  ataxia.    GAIT/STANCE: Posture is normal. Gait is steady with normal steps, base, arm swing and turning.    REVIEW OF SYSTEMS: Out of a complete 14 system review of symptoms, the patient complains only of the following symptoms, and all other reviewed systems are negative.  Headache  ALLERGIES: No Known Allergies  HOME MEDICATIONS: Outpatient Medications Prior to Visit  Medication Sig Dispense Refill   gabapentin (NEURONTIN) 300 MG capsule Take 1 capsule (300 mg total) by mouth 3 (three) times daily. 270 capsule 3   Galcanezumab-gnlm (EMGALITY) 120 MG/ML SOAJ Inject 120 mg into the skin every 30 (thirty) days. 2 mL 0   topiramate (TOPAMAX) 50 MG tablet Take 1 in the morning, take 2 at night 270 tablet 3   Galcanezumab-gnlm (EMGALITY) 120 MG/ML SOAJ Inject 120 mg into the skin every 30 (thirty) days. 3 mL 3   No facility-administered medications prior to visit.    PAST MEDICAL HISTORY: Past Medical History:  Diagnosis Date   Abdominal wall mass of left lower quadrant, possible endometrioma 07/31/2011   Abscess    lower left abdomen/groin area   Anemia    Endometriosis of abdominal wall s/p excision [id number] 07/31/2011   Fibroid    Generalized headaches    Headache    Lymph nodes enlarged    Sickle cell trait ([place])     PAST SURGICAL HISTORY: Past Surgical History:  Procedure Laterality Date   APPENDECTOMY  04/02/2009   In [place]   CESAREAN SECTION N/A 10/01/2013   Procedure: CESAREAN SECTION;  Surgeon: Truett Mainland, DO;  Location: [facility];  Service: Obstetrics;  Laterality: N/A;   CESAREAN SECTION WITH BILATERAL TUBAL LIGATION N/A 05/06/2018   Procedure: CESAREAN SECTION WITH BILATERAL TUBAL LIGATION;  Surgeon: Caren Macadam, MD;  Location: [facility];  Service: Obstetrics;  Laterality: N/A;   LAPAROSCOPY  08/28/2011   Procedure: LAPAROSCOPY DIAGNOSTIC;  Surgeon: Adin Hector, MD;  Location: [facility];  Service: General;  Laterality: N/A;  Diagnostic  Laparoscopy with Removal of Abdominal Wall Mass  Open Ventral Wall Hernia Repair with Mesh    MASS EXCISION  08/28/2011   Procedure: EXCISION MASS;  Surgeon: Adin Hector, MD;  Location: [facility];  Service: General;  Laterality: N/A;  Removal of Mass Abdominal Wall    MYOMECTOMY  07/03/2006   In [place]   VENTRAL HERNIA REPAIR  08/28/2011   Procedure: HERNIA REPAIR VENTRAL ADULT;  Surgeon: Adin Hector, MD;  Location: [facility];  Service: General;  Laterality: N/A;   Open Ventral Wall Hernia with Mesh     FAMILY HISTORY: Family History  Problem Relation Age of Onset   Healthy Mother    Healthy Father     SOCIAL HISTORY: Social History   Socioeconomic History   Marital status: Single    Spouse name: Not on file   Number of children: 3   Years of education: college   Highest education level: Bachelor's degree (e.g., BA, AB, BS)  Occupational History   Occupation: sales  Tobacco Use   Smoking status: Never   Smokeless tobacco: Never  Vaping Use   Vaping Use: Never used  Substance  and Sexual Activity   Alcohol use: No   Drug use: No   Sexual activity: Yes    Birth control/protection: None  Other Topics Concern   Not on file  Social History Narrative   Originally from [place], [place].   Lives at home with her children.   Right-handed.   No daily use of caffeine.   Social Determinants of Health   Financial Resource Strain: Not on file  Food Insecurity: Not on file  Transportation Needs: Not on file  Physical Activity: Not on file  Stress: Not on file  Social Connections: Not on file  Intimate Partner Violence: Not on file    Marcial Pacas, M.D. Ph.D.  [facility] [place], [place] [address] Phone: [phone] Fax:      [phone]

## 2021-05-01 ENCOUNTER — Ambulatory Visit: Payer: 59 | Admitting: Neurology

## 2021-05-26 NOTE — Progress Notes (Signed)
Chart reviewed, agree above plan ?

## 2021-09-10 ENCOUNTER — Telehealth: Payer: Self-pay | Admitting: *Deleted

## 2021-09-10 MED ORDER — AJOVY 225 MG/1.5ML ~~LOC~~ SOAJ: 225.0000 mg | SUBCUTANEOUS | 11 refills | Status: DC

## 2021-09-10 NOTE — Telephone Encounter (Signed)
Meds ordered this encounter  ?Medications  ? Fremanezumab-vfrm (AJOVY) 225 MG/1.5ML SOAJ  ?  Sig: Inject 225 mg into the skin every 30 (thirty) days.  ?  Dispense:  1.68 mL  ?  Refill:  11  ?   ?

## 2021-09-10 NOTE — Addendum Note (Signed)
Addended by: Marcial Pacas on: 09/10/2021 04:21 PM ? ? Modules accepted: Orders ? ?

## 2021-09-10 NOTE — Telephone Encounter (Signed)
PA request received for Emgality. She has a new Buyer, retail w/ [employer] (VF#[id number]).  ? ?This plan requires the following to be documented in the mandatory submission of medical records: ? ?1) Prior to treatment, she has 15+ headache days per month. ?2) Prior to treatment, she had 8+ migraine days per month. ? ?Additionally, she must have tried and failed the following medications (with at least a two month trial): ? ?1) Ajovy or Aimovig ?2) amitriptyline or venlafaxine ?3) divalproex or topiramate ?4) atenolol, propranolol, nadolol, timolol, metoprolol  ? ?She is on [employer] (non-formulary w/ current plan). She has also tried topiramate and amitriptyine.  ? ? ?

## 2021-09-11 NOTE — Telephone Encounter (Signed)
Spoke to pt, she is aware of medication change  ?

## 2021-09-18 ENCOUNTER — Encounter: Payer: Self-pay | Admitting: Neurology

## 2021-09-18 NOTE — Telephone Encounter (Signed)
PA for Ajovy was approved by [employer]. "Request Reference Number: [id number]. AJOVY INJ 225/1.5 is approved through 03/21/2022. Your patient may now fill this prescription and it will be covered." ?

## 2021-09-18 NOTE — Telephone Encounter (Signed)
Patient sent a mychart message requesting a PA for Ajovy. Completed via CMM. Sent to [place]. Should have a determination within 1-3 business days. Key: [id number]. ?

## 2021-11-05 NOTE — Progress Notes (Signed)
? ? ?Patient: Megan Coleman ?Date of Birth: 1985/11/12 ? ?Reason for Visit: Follow up left-sided headache ?History from: Patient ?Primary Neurologist: Dr. Krista Blue  ? ?ASSESSMENT AND PLAN ?36 y.o. year old female  ? ?1.  Persistent left-sided headache, since fall in January 2018 ?-MRI of the brain, ESR, CRP were normal ?-Has previously tried indomethacin, amitriptyline, gabapentin, Topamax, Fioricet, Emgality  ?-Continue Ajovy for headache prevention ?-Continue gabapentin 300 mg 3 times daily, Topamax 100 mg twice daily, denies adverse side effects, does not want to reduce the dose ?-Current regimen has significantly improved intensity of headaches, we talked about considering a trial of Botox in the future, headaches do have migraine features, we will follow-up in 8 months or sooner if needed, headaches are 65% better since initial fall in 2018 ? ?HISTORY  ?Megan Coleman is a 36 year old female, seen in request by her primary care nurse practitioner Everardo Beals for evaluation of headaches, ?  ?I have reviewed and summarized the referring note from the referring physician.  She began to have headaches since she fell in her bathtub in January 2018, struck her left temporal frontal region at the edge of the tub, she denied loss of consciousness, but the second day, she had a significant swelling on her left face, presented to the emergency room next day, x-ray showed no significant abnormality, but she had persistent left-sided headache ever since, she reported daily 7 out of 10 left-sided headaches, sometimes sensitivity of left temporal pain frontal skin, she denies visual loss, no swallowing difficulty, no lateralized motor or sensory deficit. ?  ?Update April 30, 2021 ?She continue has mild persistent left-sided pressure sensation, over the past few months, she was started on titrating dose of Emgality 120 mg monthly, gabapentin 300 mg 3 times a day, Topamax 150 mg daily, has helped her  headache ? ?Previously she was given a trial of indomethacin 25 mg 3 times a day without helping her headaches, ?  ?Personally reviewed MRI of the brain April 2022 that was normal ?  ?Laboratory evaluation showed normal ESR C-reactive protein, ? ?Update Nov 06, 2021 SS: [name], has had 2 injections so far. Was on Emgality, likes Ajovy better. Feels pain is less severe. Still has headache to left side, but pain level is reduced. No side effect on topamax 100 mg twice daily(increased last visit). Also on gabapentin 300 mg 3 times daily. If pain increases will take Ibuprofen 1-2 times a week. The medications are really helping. Pain level is 4/10. This is good for her. Doesn't want to reduce the dose on gabapentin or Topamax. 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 Is back in school at [employer] getting her Master's in [employer]. Sometimes with headaches that are severe, can be sensitive to light and sound.  ? ?REVIEW OF SYSTEMS: Out of a complete 14 system review of symptoms, the patient complains only of the following symptoms, and all other reviewed systems are negative. ? ?See HPI ? ?ALLERGIES: ?No Known Allergies ? ?HOME MEDICATIONS: ?Outpatient Medications Prior to Visit  ?Medication Sig Dispense Refill  ? Fremanezumab-vfrm (AJOVY) 225 MG/1.5ML SOAJ Inject 225 mg into the skin every 30 (thirty) days. 1.68 mL 11  ? gabapentin (NEURONTIN) 300 MG capsule Take 1 capsule (300 mg total) by mouth 3 (three) times daily. 270 capsule 3  ? Galcanezumab-gnlm (EMGALITY) 120 MG/ML SOAJ Inject 120 mg into the skin every 30 (thirty) days. 3 mL 3  ? topiramate (TOPAMAX) 100 MG tablet Take  1 tablet (100 mg total) by mouth 2 (two) times daily. 180 tablet 3  ? ?No facility-administered medications prior to visit.  ? ? ?PAST MEDICAL HISTORY: ?Past Medical History:  ?Diagnosis Date  ? Abdominal wall mass of left lower quadrant, possible endometrioma 07/31/2011  ? Abscess   ? lower left abdomen/groin area  ? Anemia    ? Endometriosis of abdominal wall s/p excision [id number] 07/31/2011  ? Fibroid   ? Generalized headaches   ? Headache   ? Lymph nodes enlarged   ? Sickle cell trait ([place])   ? ? ?PAST SURGICAL HISTORY: ?Past Surgical History:  ?Procedure Laterality Date  ? APPENDECTOMY  04/02/2009  ? In [place]  ? CESAREAN SECTION N/A 10/01/2013  ? Procedure: CESAREAN SECTION;  Surgeon: Truett Mainland, DO;  Location: [facility];  Service: Obstetrics;  Laterality: N/A;  ? CESAREAN SECTION WITH BILATERAL TUBAL LIGATION N/A 05/06/2018  ? Procedure: CESAREAN SECTION WITH BILATERAL TUBAL LIGATION;  Surgeon: Caren Macadam, MD;  Location: [facility];  Service: Obstetrics;  Laterality: N/A;  ? LAPAROSCOPY  08/28/2011  ? Procedure: LAPAROSCOPY DIAGNOSTIC;  Surgeon: Adin Hector, MD;  Location: [facility];  Service: General;  Laterality: N/A;  Diagnostic Laparoscopy with Removal of Abdominal Wall Mass  Open Ventral Wall Hernia Repair with Mesh   ? MASS EXCISION  08/28/2011  ? Procedure: EXCISION MASS;  Surgeon: Adin Hector, MD;  Location: [facility];  Service: General;  Laterality: N/A;  Removal of Mass Abdominal Wall   ? MYOMECTOMY  07/03/2006  ? In [place]  ? VENTRAL HERNIA REPAIR  08/28/2011  ? Procedure: HERNIA REPAIR VENTRAL ADULT;  Surgeon: Adin Hector, MD;  Location: [facility];  Service: General;  Laterality: N/A;   Open Ventral Wall Hernia with Mesh   ? ? ?FAMILY HISTORY: ?Family History  ?Problem Relation Age of Onset  ? Healthy Mother   ? Healthy Father   ? ? ?SOCIAL HISTORY: ?Social History  ? ?Socioeconomic History  ? Marital status: Single  ?  Spouse name: Not on file  ? Number of children: 3  ? Years of education: college  ? Highest education level: Bachelor's degree (e.g., BA, AB, BS)  ?Occupational History  ? Occupation: Press photographer  ?Tobacco Use  ? Smoking status: Never  ? Smokeless tobacco: Never  ?Vaping Use  ? Vaping Use: Never used  ?Substance and Sexual Activity  ? Alcohol use: No  ? Drug use: No  ? Sexual  activity: Yes  ?  Birth control/protection: None  ?Other Topics Concern  ? Not on file  ?Social History Narrative  ? Originally from [place], [place].  ? Lives at home with her children.  ? Right-handed.  ? No daily use of caffeine.  ? ?Social Determinants of Health  ? ?Financial Resource Strain: Not on file  ?Food Insecurity: Not on file  ?Transportation Needs: Not on file  ?Physical Activity: Not on file  ?Stress: Not on file  ?Social Connections: Not on file  ?Intimate Partner Violence: Not on file  ? ? ?PHYSICAL EXAM ? ?Vitals:  ? 11/06/21 1120  ?BP: (!) 96/59  ?Pulse: 64  ?Weight: 163 lb (73.9 kg)  ?Height: 5' 7"  (1.702 m)  ? ?Body mass index is 25.53 kg/m?. ? ?Generalized: Well developed, in no acute distress  ?Neurological examination  ?Mentation: Alert oriented to time, place, history taking. Follows all commands speech and language fluent ?Cranial nerve II-XII: Pupils were equal round reactive to light. Extraocular  movements were full, visual field were full on confrontational test. Facial sensation and strength were normal.  Head turning and shoulder shrug  were normal and symmetric. ?Motor: The motor testing reveals 5 over 5 strength of all 4 extremities. Good symmetric motor tone is noted throughout.  ?Sensory: Sensory testing is intact to soft touch on all 4 extremities. No evidence of extinction is noted.  ?Coordination: Cerebellar testing reveals good finger-nose-finger and heel-to-shin bilaterally.  ?Gait and station: Gait is normal.  ?Reflexes: Deep tendon reflexes are symmetric and normal bilaterally.  ? ?DIAGNOSTIC DATA (LABS, IMAGING, TESTING) ?- I reviewed patient records, labs, notes, testing and imaging myself where available. ? ?Lab Results  ?Component Value Date  ? WBC 7.6 05/07/2018  ? HGB 9.8 (L) 05/07/2018  ? HCT 29.7 (L) 05/07/2018  ? MCV 84.9 05/07/2018  ? PLT 122 (L) 05/07/2018  ? ?   ?Component Value Date/Time  ? NA 136 05/07/2018 2004  ? K 3.7 05/07/2018 2004  ? CL 105 05/07/2018  2004  ? CO2 22 05/07/2018 2004  ? GLUCOSE 107 (H) 05/07/2018 2004  ? BUN 8 05/07/2018 2004  ? CREATININE 0.78 05/07/2018 2004  ? CALCIUM 8.8 (L) 05/07/2018 2004  ? PROT 8.2 (H) 01/28/2016 1022  ? ALBUMIN 4.6 01/28/2016

## 2021-11-06 ENCOUNTER — Ambulatory Visit (INDEPENDENT_AMBULATORY_CARE_PROVIDER_SITE_OTHER): Payer: Self-pay | Admitting: Neurology

## 2021-11-06 ENCOUNTER — Encounter: Payer: Self-pay | Admitting: Neurology

## 2021-11-06 VITALS — BP 96/59 | HR 64 | Ht 67.0 in | Wt 163.0 lb

## 2021-11-06 DIAGNOSIS — G8929 Other chronic pain: Secondary | ICD-10-CM

## 2021-11-06 DIAGNOSIS — R519 Headache, unspecified: Secondary | ICD-10-CM

## 2021-11-06 MED ORDER — GABAPENTIN 300 MG PO CAPS: 300.0000 mg | ORAL_CAPSULE | Freq: Three times a day (TID) | ORAL | 3 refills | Status: DC

## 2021-11-06 MED ORDER — TOPIRAMATE 100 MG PO TABS: 100.0000 mg | ORAL_TABLET | Freq: Two times a day (BID) | ORAL | 3 refills | Status: DC

## 2021-11-06 NOTE — Progress Notes (Signed)
Chart reviewed, agree above plan ?

## 2022-04-03 ENCOUNTER — Telehealth: Payer: Self-pay

## 2022-04-03 NOTE — Telephone Encounter (Signed)
PA for Ajovy has been sent  (Key: [id number])  Your information has been sent to [facility].

## 2022-07-09 ENCOUNTER — Ambulatory Visit: Payer: 59 | Admitting: Neurology

## 2022-07-09 ENCOUNTER — Encounter: Payer: Self-pay | Admitting: Neurology

## 2022-07-09 VITALS — BP 95/63 | HR 70 | Ht 67.0 in | Wt 164.0 lb

## 2022-07-09 DIAGNOSIS — G8929 Other chronic pain: Secondary | ICD-10-CM | POA: Diagnosis not present

## 2022-07-09 DIAGNOSIS — R519 Headache, unspecified: Secondary | ICD-10-CM | POA: Diagnosis not present

## 2022-07-09 DIAGNOSIS — G43909 Migraine, unspecified, not intractable, without status migrainosus: Secondary | ICD-10-CM | POA: Diagnosis not present

## 2022-07-09 MED ORDER — AJOVY 225 MG/1.5ML ~~LOC~~ SOAJ: 225.0000 mg | SUBCUTANEOUS | 11 refills | Status: DC

## 2022-07-09 MED ORDER — GABAPENTIN 400 MG PO CAPS: 400.0000 mg | ORAL_CAPSULE | Freq: Three times a day (TID) | ORAL | 1 refills | Status: DC

## 2022-07-09 MED ORDER — TOPIRAMATE 100 MG PO TABS: 100.0000 mg | ORAL_TABLET | Freq: Two times a day (BID) | ORAL | 3 refills | Status: DC

## 2022-07-09 NOTE — Patient Instructions (Signed)
Increase gabapentin 400 mg 3 times daily, continue the Topamax, Ajovy Call for medication adjustment See you back in 6 months

## 2022-07-09 NOTE — Progress Notes (Addendum)
Patient: Megan Coleman Date of Birth: [birth year]  Reason for Visit: Follow up left-sided headache History from: Patient Primary Neurologist: Dr. Krista Blue   ASSESSMENT AND PLAN 37 y.o. year old female   1.  Persistent left-sided headache, since fall in January 2018 2.  Episodic Migraine  -Overall, has had really good benefit with Ajovy, does note wearing off effect about 20 days into cycle, this resulted in increased left-sided pain, pain does not seem consistent with TN -Increase gabapentin to 400 mg 3 times a day -Continue Ajovy for headache prevention -Continue Topamax 100 mg twice a day -Has previously tried indomethacin, amitriptyline, gabapentin, Topamax, Fioricet, Emgality  -MRI of the brain, ESR, CRP were normal -Follow-up in 6 months or sooner if needed  Meds ordered this encounter  Medications   topiramate (TOPAMAX) 100 MG tablet    Sig: Take 1 tablet (100 mg total) by mouth 2 (two) times daily.    Dispense:  180 tablet    Refill:  3   Fremanezumab-vfrm (AJOVY) 225 MG/1.5ML SOAJ    Sig: Inject 225 mg into the skin every 30 (thirty) days.    Dispense:  1.68 mL    Refill:  11   gabapentin (NEURONTIN) 400 MG capsule    Sig: Take 1 capsule (400 mg total) by mouth 3 (three) times daily.    Dispense:  270 capsule    Refill:  1   HISTORY  Megan Coleman is a 37 year old female, seen in request by her primary care nurse practitioner Everardo Beals for evaluation of headaches,   I have reviewed and summarized the referring note from the referring physician.  She began to have headaches since she fell in her bathtub in January 2018, struck her left temporal frontal region at the edge of the tub, she denied loss of consciousness, but the second day, she had a significant swelling on her left face, presented to the emergency room next day, x-ray showed no significant abnormality, but she had persistent left-sided headache ever since, she reported daily 7 out of 10  left-sided headaches, sometimes sensitivity of left temporal pain frontal skin, she denies visual loss, no swallowing difficulty, no lateralized motor or sensory deficit.   Update April 30, 2021 She continue has mild persistent left-sided pressure sensation, over the past few months, she was started on titrating dose of Emgality 120 mg monthly, gabapentin 300 mg 3 times a day, Topamax 150 mg daily, has helped her headache  Previously she was given a trial of indomethacin 25 mg 3 times a day without helping her headaches,   Personally reviewed MRI of the brain April 2022 that was normal   Laboratory evaluation showed normal ESR C-reactive protein,  Update Nov 06, 2021 SS: Megan Coleman, has had 2 injections so far. Was on Emgality, likes Ajovy better. Feels pain is less severe. Still has headache to left side, but pain level is reduced. No side effect on topamax 100 mg twice daily(increased last visit). Also on gabapentin 300 mg 3 times daily. If pain increases will take Ibuprofen 1-2 times a week. The medications are really helping. Pain level is 4/10. This is good for her. Doesn't want to reduce the dose on gabapentin or Topamax. 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 Sometimes with headaches that are severe, can be sensitive to light and sound.   Update July 09, 2022 SS: Feels really good benefit from  Ajovy for 1st 20 days, then can wear off, can feel more left pressure to left temple, sometimes around left mouth under left eye. Also on gabapentin 300 mg TID, Topamax 100 mg BID. May add in Tylenol if needed. No worsening of facial pain with talking or eating.   REVIEW OF SYSTEMS: Out of a complete 14 system review of symptoms, the patient complains only of the following symptoms, and all other reviewed systems are negative.  See HPI  ALLERGIES: No Known Allergies  HOME MEDICATIONS: Outpatient Medications  Prior to Visit  Medication Sig Dispense Refill   Fremanezumab-vfrm (AJOVY) 225 MG/1.5ML SOAJ Inject 225 mg into the skin every 30 (thirty) days. 1.68 mL 11   gabapentin (NEURONTIN) 300 MG capsule Take 1 capsule (300 mg total) by mouth 3 (three) times daily. 270 capsule 3   topiramate (TOPAMAX) 100 MG tablet Take 1 tablet (100 mg total) by mouth 2 (two) times daily. 180 tablet 3   No facility-administered medications prior to visit.    PAST MEDICAL HISTORY: Past Medical History:  Diagnosis Date   Abdominal wall mass of left lower quadrant, possible endometrioma 07/31/2011   Abscess    lower left abdomen/groin area   Anemia    Endometriosis of abdominal wall s/p excision [id number] 07/31/2011   Fibroid    Generalized headaches    Headache    Lymph nodes enlarged    Sickle cell trait ([place])     PAST SURGICAL HISTORY: Past Surgical History:  Procedure Laterality Date   APPENDECTOMY  04/02/2009   In [place]   CESAREAN SECTION N/A 10/01/2013   Procedure: CESAREAN SECTION;  Surgeon: Truett Mainland, DO;  Location: [facility];  Service: Obstetrics;  Laterality: N/A;   CESAREAN SECTION WITH BILATERAL TUBAL LIGATION N/A 05/06/2018   Procedure: CESAREAN SECTION WITH BILATERAL TUBAL LIGATION;  Surgeon: Caren Macadam, MD;  Location: [facility];  Service: Obstetrics;  Laterality: N/A;   LAPAROSCOPY  08/28/2011   Procedure: LAPAROSCOPY DIAGNOSTIC;  Surgeon: Adin Hector, MD;  Location: [facility];  Service: General;  Laterality: N/A;  Diagnostic Laparoscopy with Removal of Abdominal Wall Mass  Open Ventral Wall Hernia Repair with Mesh    MASS EXCISION  08/28/2011   Procedure: EXCISION MASS;  Surgeon: Adin Hector, MD;  Location: [facility];  Service: General;  Laterality: N/A;  Removal of Mass Abdominal Wall    MYOMECTOMY  07/03/2006   In [place]   VENTRAL HERNIA REPAIR  08/28/2011   Procedure: HERNIA REPAIR VENTRAL ADULT;  Surgeon: Adin Hector, MD;  Location: [facility];  Service:  General;  Laterality: N/A;   Open Ventral Wall Hernia with Mesh     FAMILY HISTORY: Family History  Problem Relation Age of Onset   Healthy Mother    Healthy Father     SOCIAL HISTORY: Social History   Socioeconomic History   Marital status: Single    Spouse name: Not on file   Number of children: 3   Years of education: college   Highest education level: Bachelor's degree (e.g., BA, AB, BS)  Occupational History   Occupation: sales  Tobacco Use   Smoking status: Never   Smokeless tobacco: Never  Vaping Use   Vaping Use: Never used  Substance and Sexual Activity   Alcohol use: No   Drug use: No   Sexual activity: Yes    Birth control/protection: None  Other Topics Concern   Not on file  Social History Narrative  Originally from [place], [place].   Lives at home with her children.   Right-handed.   No daily use of caffeine.   Social Determinants of Health   Financial Resource Strain: Low Risk  (04/28/2018)   Overall Financial Resource Strain (CARDIA)    Difficulty of Paying Living Expenses: Not hard at all  Food Insecurity: No Food Insecurity (04/28/2018)   Hunger Vital Sign    Worried About Running Out of Food in the Last Year: Never true    Ran Out of Food in the Last Year: Never true  Transportation Needs: Unknown (04/28/2018)   PRAPARE - Hydrologist (Medical): No    Lack of Transportation (Non-Medical): Not on file  Physical Activity: Not on file  Stress: No Stress Concern Present (04/28/2018)   [place]    Feeling of Stress : Not at all  Social Connections: Not on file  Intimate Partner Violence: Not At Risk (04/28/2018)   Humiliation, Afraid, Rape, and Kick questionnaire    Fear of Current or Ex-Partner: No    Emotionally Abused: No    Physically Abused: No    Sexually Abused: No    PHYSICAL EXAM  Vitals:   07/09/22 1123  BP: 95/63  Pulse: 70   Weight: 164 lb (74.4 kg)  Height: '5\' 7"'$  (1.702 m)    Body mass index is 25.69 kg/m.  Generalized: Well developed, in no acute distress  Neurological examination  Mentation: Alert oriented to time, place, history taking. Follows all commands speech and language fluent Cranial nerve II-XII: Pupils were equal round reactive to light. Extraocular movements were full, visual field were full on confrontational test. Facial sensation and strength were normal.  Head turning and shoulder shrug  were normal and symmetric. Motor: The motor testing reveals 5 over 5 strength of all 4 extremities. Good symmetric motor tone is noted throughout.  Sensory: Sensory testing is intact to soft touch on all 4 extremities. No evidence of extinction is noted.  Coordination: Cerebellar testing reveals good finger-nose-finger and heel-to-shin bilaterally.  Gait and station: Gait is normal.  Reflexes: Deep tendon reflexes are symmetric and normal bilaterally.   DIAGNOSTIC DATA (LABS, IMAGING, TESTING) - I reviewed patient records, labs, notes, testing and imaging myself where available.  Lab Results  Component Value Date   WBC 7.6 05/07/2018   HGB 9.8 (L) 05/07/2018   HCT 29.7 (L) 05/07/2018   MCV 84.9 05/07/2018   PLT 122 (L) 05/07/2018      Component Value Date/Time   NA 136 05/07/2018 2004   K 3.7 05/07/2018 2004   CL 105 05/07/2018 2004   CO2 22 05/07/2018 2004   GLUCOSE 107 (H) 05/07/2018 2004   BUN 8 05/07/2018 2004   CREATININE 0.78 05/07/2018 2004   CALCIUM 8.8 (L) 05/07/2018 2004   PROT 8.2 (H) 01/28/2016 1022   ALBUMIN 4.6 01/28/2016 1022   AST 25 01/28/2016 1022   ALT 19 01/28/2016 1022   ALKPHOS 60 01/28/2016 1022   BILITOT 0.7 01/28/2016 1022   GFRNONAA >60 05/07/2018 2004   GFRAA >60 05/07/2018 2004   No results found for: "CHOL", "HDL", "LDLCALC", "LDLDIRECT", "TRIG", "CHOLHDL" Lab Results  Component Value Date   HGBA1C 5.6 11/19/2017   Lab Results  Component Value Date    VITAMINB12 522 04/09/2018   No results found for: "TSH"  Butler Denmark, AGNP-C, DNP 07/09/2022, 1:12 PM [facility] [address]  [place], [place] [address] [phone]

## 2022-07-12 ENCOUNTER — Encounter: Payer: Self-pay | Admitting: Neurology

## 2022-07-15 ENCOUNTER — Other Ambulatory Visit (HOSPITAL_COMMUNITY): Payer: Self-pay

## 2022-07-15 ENCOUNTER — Telehealth: Payer: Self-pay

## 2022-07-15 NOTE — Telephone Encounter (Signed)
Pharmacy Patient Advocate Encounter   Received notification from [facility] that prior authorization for AJOVY (fremanezumab-vfrm) injection '225MG'$ /1.5ML auto-injectors is required/requested.    PA submitted on 07/15/2022 to (ins) [employer] via CoverMyMeds Key [id number]  Status is pending

## 2022-07-15 NOTE — Telephone Encounter (Signed)
PA for Ajovy is needed, thank you.

## 2022-07-18 ENCOUNTER — Encounter: Payer: Self-pay | Admitting: Neurology

## 2022-07-18 DIAGNOSIS — G43909 Migraine, unspecified, not intractable, without status migrainosus: Secondary | ICD-10-CM | POA: Insufficient documentation

## 2022-07-18 NOTE — Telephone Encounter (Signed)
I reviewed the PA denial. This Pa was denied due to the fact the pt does not have a dx of episodic migraines or chronic migraines. We can try and appeal but I am not sure if this will be covered due to the formulary changes.

## 2022-07-19 NOTE — Telephone Encounter (Signed)
Pharmacy Patient Advocate Encounter  Received notification from [employer] that the request for prior authorization for AJOVY (fremanezumab-vfrm) injection '225MG'$ /1.5ML auto-injectors has been denied due to -   Please be advised we currently do not have a Pharmacist to review denials, therefore you will need to process appeals accordingly as needed. Thanks for your support at this time.   You may call See below or fax See below, to appeal.

## 2022-07-22 NOTE — Telephone Encounter (Signed)
Megan Coleman has completed an addendum for her note, can we reprocess this request?

## 2022-07-23 ENCOUNTER — Other Ambulatory Visit (HOSPITAL_COMMUNITY): Payer: Self-pay

## 2022-07-23 NOTE — Telephone Encounter (Signed)
Tried to resubmit but would not allow me to. Will need an appeal.

## 2022-07-24 NOTE — Telephone Encounter (Signed)
Appeal letter signed and faxed.

## 2022-07-24 NOTE — Telephone Encounter (Signed)
I have printed the letter for NP to sign for the appeal.

## 2022-08-16 ENCOUNTER — Ambulatory Visit: Payer: 59 | Admitting: Cardiology

## 2022-08-16 ENCOUNTER — Encounter: Payer: Self-pay | Admitting: Cardiology

## 2022-08-16 ENCOUNTER — Other Ambulatory Visit: Payer: 59

## 2022-08-16 VITALS — BP 99/62 | HR 68 | Ht 67.0 in | Wt 165.0 lb

## 2022-08-16 DIAGNOSIS — R002 Palpitations: Secondary | ICD-10-CM

## 2022-08-16 NOTE — Progress Notes (Signed)
ID:  Megan Coleman, DOB 01-31-1986, MRN [id number]  PCP:  Shanon Rosser, PA-C  Cardiologist:  Rex Kras, DO, [facility] (established care 08/16/2022)  REASON FOR CONSULT: Palpitations  REQUESTING PHYSICIAN:  Everardo Beals, NP [place],  [place] [address]  Chief Complaint  Patient presents with   Palpitations   New Patient (Initial Visit)    HPI  Megan Coleman is a 37 y.o. African-American female who presents to the clinic for evaluation of palpitations at the request of Everardo Beals, NP. Her past medical history and cardiovascular risk factors include: Migraine sickle cell trait, fibroids, endometriosis.  The patient presents to the office for evaluation of irregular heartbeat.  The symptoms have been ongoing since the age of 63 and they are intermittent.  They can happen on a weekly or daily basis, lasting from seconds to minutes, no associated triggers, no associated near-syncope or syncopal events.  When she has these irregular heartbeats she does experience lightheaded and dizziness at times and diaphoresis.  No unifying diagnosis has been made since then.  Does not consume caffeinated beverages, sodas, weight loss supplements, stimulants, illicit drugs.  Occasionally drinks alcohol, averaging once a week at parties up to a bottle of wine.  No history of thyroid disease or anemia.  No family history of premature coronary disease, sudden cardiac death, or cardiomyopathy.  FUNCTIONAL STATUS: She enjoys jogging/running 30 to 40 minutes 3 to 4 days a week.  ALLERGIES: No Known Allergies  MEDICATION LIST PRIOR TO VISIT: Current Meds  Medication Sig   Fremanezumab-vfrm (AJOVY) 225 MG/1.5ML SOAJ Inject 225 mg into the skin every 30 (thirty) days.   gabapentin (NEURONTIN) 400 MG capsule Take 1 capsule (400 mg total) by mouth 3 (three) times daily.   topiramate (TOPAMAX) 100 MG tablet Take 1 tablet (100 mg total) by mouth 2 (two) times daily.      PAST MEDICAL HISTORY: Past Medical History:  Diagnosis Date   Abdominal wall mass of left lower quadrant, possible endometrioma 07/31/2011   Abscess    lower left abdomen/groin area   Anemia    Endometriosis of abdominal wall s/p excision [id number] 07/31/2011   Fibroid    Generalized headaches    Headache    Lymph nodes enlarged    Sickle cell trait ([place])     PAST SURGICAL HISTORY: Past Surgical History:  Procedure Laterality Date   APPENDECTOMY  04/02/2009   In [place]   CESAREAN SECTION N/A 10/01/2013   Procedure: CESAREAN SECTION;  Surgeon: Truett Mainland, DO;  Location: [facility];  Service: Obstetrics;  Laterality: N/A;   CESAREAN SECTION WITH BILATERAL TUBAL LIGATION N/A 05/06/2018   Procedure: CESAREAN SECTION WITH BILATERAL TUBAL LIGATION;  Surgeon: Caren Macadam, MD;  Location: [facility];  Service: Obstetrics;  Laterality: N/A;   LAPAROSCOPY  08/28/2011   Procedure: LAPAROSCOPY DIAGNOSTIC;  Surgeon: Adin Hector, MD;  Location: [facility];  Service: General;  Laterality: N/A;  Diagnostic Laparoscopy with Removal of Abdominal Wall Mass  Open Ventral Wall Hernia Repair with Mesh    MASS EXCISION  08/28/2011   Procedure: EXCISION MASS;  Surgeon: Adin Hector, MD;  Location: [facility];  Service: General;  Laterality: N/A;  Removal of Mass Abdominal Wall    MYOMECTOMY  07/03/2006   In [place]   VENTRAL HERNIA REPAIR  08/28/2011   Procedure: HERNIA REPAIR VENTRAL ADULT;  Surgeon: Adin Hector, MD;  Location: [facility];  Service: General;  Laterality:  N/A;   Open Ventral Wall Hernia with Mesh     FAMILY HISTORY: The patient family history includes Healthy in her father and mother.  SOCIAL HISTORY:  The patient  reports that she has never smoked. She has never used smokeless tobacco. She reports that she does not drink alcohol and does not use drugs.  REVIEW OF SYSTEMS: Review of Systems  Cardiovascular:  Positive for irregular heartbeat and palpitations.  Negative for chest pain, claudication, dyspnea on exertion, leg swelling, near-syncope, orthopnea, paroxysmal nocturnal dyspnea and syncope.  Respiratory:  Negative for shortness of breath.   Hematologic/Lymphatic: Negative for bleeding problem.  Musculoskeletal:  Negative for muscle cramps and myalgias.  Neurological:  Negative for dizziness and light-headedness.    PHYSICAL EXAM:    08/16/2022   11:16 AM 07/09/2022   11:23 AM 11/06/2021   11:20 AM  Vitals with BMI  Height '5\' 7"'$  '5\' 7"'$  '5\' 7"'$   Weight 165 lbs 164 lbs 163 lbs  BMI 25.84 [id number]  Systolic 99 95 96  Diastolic 62 63 59  Pulse 68 70 64    Physical Exam  Constitutional: No distress.  Age appropriate, hemodynamically stable.   Neck: No JVD present.  Cardiovascular: Normal rate, regular rhythm, S1 normal, S2 normal, intact distal pulses and normal pulses. Exam reveals no gallop, no S3 and no S4.  No murmur heard. Pulmonary/Chest: Effort normal and breath sounds normal. No stridor. She has no wheezes. She has no rales.  Abdominal: Soft. Bowel sounds are normal. She exhibits no distension. There is no abdominal tenderness.  Musculoskeletal:        General: No edema.     Cervical back: Neck supple.  Neurological: She is alert and oriented to person, place, and time. She has intact cranial nerves (2-12).  Skin: Skin is warm and moist.   CARDIAC DATABASE: EKG: 08/16/2022: Sinus rhythm, 60 bpm, normal axis, without underlying ischemia or injury pattern  Echocardiogram: No results found for this or any previous visit from the past 1095 days.    Stress Testing: No results found for this or any previous visit from the past 1095 days.   Heart Catheterization: None  LABORATORY DATA:    Latest Ref Rng & Units 05/07/2018    8:04 PM 05/07/2018    6:16 AM 05/06/2018    5:40 AM  CBC  WBC 4.0 - 10.5 K/uL 7.6  8.1  3.4   Hemoglobin 12.0 - 15.0 g/dL 9.8  9.4  10.9   Hematocrit 36.0 - 46.0 % 29.7  28.1  33.5    Platelets 150 - 400 K/uL 122  134  133        Latest Ref Rng & Units 05/07/2018    8:04 PM 01/28/2016   10:22 AM 03/22/2015   10:41 PM  CMP  Glucose 70 - 99 mg/dL 107  104  90   BUN 6 - 20 mg/dL '8  11  9   '$ Creatinine 0.44 - 1.00 mg/dL 0.78  0.90  0.86   Sodium 135 - 145 mmol/L 136  137  134   Potassium 3.5 - 5.1 mmol/L 3.7  3.8  3.7   Chloride 98 - 111 mmol/L 105  105  104   CO2 22 - 32 mmol/L '22  22  24   '$ Calcium 8.9 - 10.3 mg/dL 8.8  9.4  9.4   Total Protein 6.5 - 8.1 g/dL  8.2  7.2   Total Bilirubin 0.3 - 1.2 mg/dL  0.7  0.4  Alkaline Phos 38 - 126 U/L  60  68   AST 15 - 41 U/L  25  20   ALT 14 - 54 U/L  19  21     Lipid Panel  No results found for: "CHOL", "TRIG", "HDL", "CHOLHDL", "VLDL", "LDLCALC", "LDLDIRECT", "LABVLDL"  No components found for: "NTPROBNP" No results for input(s): "PROBNP" in the last 8760 hours. No results for input(s): "TSH" in the last 8760 hours.  BMP No results for input(s): "NA", "K", "CL", "CO2", "GLUCOSE", "BUN", "CREATININE", "CALCIUM", "GFRNONAA", "GFRAA" in the last 8760 hours.  HEMOGLOBIN A1C Lab Results  Component Value Date   HGBA1C 5.6 11/19/2017   External Labs: Collected: 08/06/2021. TSH 2.02 Hemoglobin 13.2, hematocrit 40.5%. BUN 11, creatinine 0.89. eGFR 86. Sodium 139, potassium 4, chloride 106, bicarb 22. AST 13, ALT 11, alkaline phosphatase 55 Total cholesterol 235, triglycerides 91, HDL 71, LDL 144, non-HDL 164   IMPRESSION:    ICD-10-CM   1. Palpitations  R00.2 EKG 12-Lead    LONG TERM MONITOR (3-14 DAYS)    Hemoglobin and hematocrit, blood    CMP14+EGFR    TSH       RECOMMENDATIONS: Megan Coleman is a 38 y.o. African-American female whose past medical history and cardiac risk factors include: Migraine sickle cell trait, fibroids, endometriosis.  Palpitations No identifiable reversible cause No underlying near-syncope or syncopal events Outside labs from February 2023 independently reviewed Will  recheck TSH, H&H, CMP Reeducated on the importance of 3 heart healthy meals, keeping yourself well-hydrated. 2-week Zio patch to evaluate for underlying dysrhythmias. Patient is asked to go to the closest ER via EMS if she has new onset of syncope. Further recommendations to follow.  Data Reviewed: I have independently reviewed external notes provided by the referring provider as part of this office visit.   I have independently reviewed results of labs, EKG as part of medical decision making. I have ordered the following tests:  Orders Placed This Encounter  Procedures   Hemoglobin and hematocrit, blood   CMP14+EGFR   TSH   LONG TERM MONITOR (3-14 DAYS)    Standing Status:   Future    Number of Occurrences:   1    Standing Expiration Date:   08/17/2023    Order Specific Question:   Where should this test be performed?    Answer:   [facility]    Order Specific Question:   Does the patient have an implanted cardiac device?    Answer:   No    Order Specific Question:   Prescribed days of wear    Answer:   35    Order Specific Question:   Type of enrollment    Answer:   Clinic Enrollment   EKG 12-Lead   I have not made medications changes at today's encounter as noted above.  FINAL MEDICATION LIST END OF ENCOUNTER: No orders of the defined types were placed in this encounter.   There are no discontinued medications.   Current Outpatient Medications:    Fremanezumab-vfrm (AJOVY) 225 MG/1.5ML SOAJ, Inject 225 mg into the skin every 30 (thirty) days., Disp: 1.68 mL, Rfl: 11   gabapentin (NEURONTIN) 400 MG capsule, Take 1 capsule (400 mg total) by mouth 3 (three) times daily., Disp: 270 capsule, Rfl: 1   topiramate (TOPAMAX) 100 MG tablet, Take 1 tablet (100 mg total) by mouth 2 (two) times daily., Disp: 180 tablet, Rfl: 3  Orders Placed This Encounter  Procedures   Hemoglobin and hematocrit, blood  CMP14+EGFR   TSH   LONG TERM MONITOR (3-14 DAYS)   EKG 12-Lead     There are no Patient Instructions on file for this visit.   --Continue cardiac medications as reconciled in final medication list. --Return in about 7 weeks (around 10/04/2022) for Follow up, Palpitations, Review test results. or sooner if needed. --Continue follow-up with your primary care physician regarding the management of your other chronic comorbid conditions.  Patient's questions and concerns were addressed to her satisfaction. She voices understanding of the instructions provided during this encounter.   This note was created using a voice recognition software as a result there may be grammatical errors inadvertently enclosed that do not reflect the nature of this encounter. Every attempt is made to correct such errors.  Rex Kras, [place], [facility]  Pager: [phone] Office: [phone]

## 2022-10-04 ENCOUNTER — Ambulatory Visit: Payer: 59 | Admitting: Cardiology

## 2022-10-04 ENCOUNTER — Encounter: Payer: Self-pay | Admitting: Cardiology

## 2022-10-04 VITALS — BP 98/67 | HR 70 | Ht 67.0 in | Wt 163.0 lb

## 2022-10-04 DIAGNOSIS — R002 Palpitations: Secondary | ICD-10-CM

## 2022-10-04 NOTE — Progress Notes (Signed)
ID:  Megan Coleman, DOB 07-30-1985, MRN [id number]  PCP:  Lindaann Pascal, PA-C  Cardiologist:  Tessa Lerner, DO, [facility] (established care 08/16/2022)  Date: 10/04/22 Last Office Visit: 08/16/2022  Chief Complaint  Patient presents with   Palpitations   Follow-up    HPI  Megan Coleman is a 37 y.o. African-American female whose past medical history and cardiovascular risk factors include: Migraine, sickle cell trait, fibroids, endometriosis.  She was referred to the practice for irregular heart rate/palpitations.  The symptoms have been ongoing since the age of 61 and overall intermittent.  However recently occurring weekly/daily.  No identifiable triggers.  No known syncope or near syncope.  At the last office visit recommended checking blood work and Cytogeneticist.  Since last office visit, overall frequency of palpitations have improved.  She denies any near-syncope or syncopal events.  Labs that were ordered are still pending.  No family history of premature coronary disease, sudden cardiac death, or cardiomyopathy.  FUNCTIONAL STATUS: She enjoys jogging/running 30 to 40 minutes 3 to 4 days a week.  ALLERGIES: No Known Allergies  MEDICATION LIST PRIOR TO VISIT: Current Meds  Medication Sig   Fremanezumab-vfrm (AJOVY) 225 MG/1.5ML SOAJ Inject 225 mg into the skin every 30 (thirty) days.   gabapentin (NEURONTIN) 400 MG capsule Take 1 capsule (400 mg total) by mouth 3 (three) times daily.   topiramate (TOPAMAX) 100 MG tablet Take 1 tablet (100 mg total) by mouth 2 (two) times daily.     PAST MEDICAL HISTORY: Past Medical History:  Diagnosis Date   Abdominal wall mass of left lower quadrant, possible endometrioma 07/31/2011   Abscess    lower left abdomen/groin area   Anemia    Endometriosis of abdominal wall s/p excision [id number] 07/31/2011   Fibroid    Generalized headaches    Headache    Lymph nodes enlarged    Sickle cell trait     PAST SURGICAL HISTORY: Past  Surgical History:  Procedure Laterality Date   APPENDECTOMY  04/02/2009   In [place]   CESAREAN SECTION N/A 10/01/2013   Procedure: CESAREAN SECTION;  Surgeon: Levie Heritage, DO;  Location: [facility];  Service: Obstetrics;  Laterality: N/A;   CESAREAN SECTION WITH BILATERAL TUBAL LIGATION N/A 05/06/2018   Procedure: CESAREAN SECTION WITH BILATERAL TUBAL LIGATION;  Surgeon: Federico Flake, MD;  [facility];  Service: Obstetrics;  Laterality: N/A;   LAPAROSCOPY  08/28/2011   Procedure: LAPAROSCOPY DIAGNOSTIC;  Surgeon: Ardeth Sportsman, MD;  Location: [facility];  Service: General;  Laterality: N/A;  Diagnostic Laparoscopy with Removal of Abdominal Wall Mass  Open Ventral Wall Hernia Repair with Mesh    MASS EXCISION  08/28/2011   Procedure: EXCISION MASS;  Surgeon: Ardeth Sportsman, MD;  Location: [facility];  Service: General;  Laterality: N/A;  Removal of Mass Abdominal Wall    MYOMECTOMY  07/03/2006   In [place]   VENTRAL HERNIA REPAIR  08/28/2011   Procedure: HERNIA REPAIR VENTRAL ADULT;  Surgeon: Ardeth Sportsman, MD;  Location: [facility];  Service: General;  Laterality: N/A;   Open Ventral Wall Hernia with Mesh     FAMILY HISTORY: The patient family history includes Healthy in her father and mother.  SOCIAL HISTORY:  The patient  reports that she has never smoked. She has never used smokeless tobacco. She reports that she does not drink alcohol and does not use drugs.  REVIEW OF SYSTEMS: Review of Systems  Cardiovascular:  Positive for palpitations (improved). Negative for chest pain, claudication, dyspnea on exertion, leg swelling, near-syncope, orthopnea, paroxysmal nocturnal dyspnea and syncope.  Respiratory:  Negative for shortness of breath.   Hematologic/Lymphatic: Negative for bleeding problem.  Musculoskeletal:  Negative for muscle cramps and myalgias.  Neurological:  Negative for dizziness and light-headedness.    PHYSICAL EXAM:    10/04/2022    9:52 AM  08/16/2022   11:16 AM 07/09/2022   11:23 AM  Vitals with BMI  Height     Weight 163 lbs 165 lbs 164 lbs  BMI 25.52 25.84 25.68  Systolic 98 99 95  Diastolic 67 62 63  Pulse 70 68 70    Physical Exam  Constitutional: No distress.  Age appropriate, hemodynamically stable.   Neck: No JVD present.  Cardiovascular: Normal rate, regular rhythm, S1 normal, S2 normal, intact distal pulses and normal pulses. Exam reveals no gallop, no S3 and no S4.  No murmur heard. Pulmonary/Chest: Effort normal and breath sounds normal. No stridor. She has no wheezes. She has no rales.  Abdominal: Soft. Bowel sounds are normal. She exhibits no distension. There is no abdominal tenderness.  Musculoskeletal:        General: No edema.     Cervical back: Neck supple.  Neurological: She is alert and oriented to person, place, and time. She has intact cranial nerves (2-12).  Skin: Skin is warm and moist.   CARDIAC DATABASE: EKG: 08/16/2022: Sinus rhythm, 60 bpm, normal axis, without underlying ischemia or injury pattern  Echocardiogram: Cardiac monitor (Zio Patch): August 16, 2022 - August 30, 2022 Dominant rhythm sinus, followed by tachycardia (20% burden). Heart rate 47-179 bpm.  Avg HR 84 bpm. No atrial fibrillation, supraventricular tachycardia, ventricular tachycardia, high grade AV block, pauses (3 seconds or longer). Total ventricular ectopic burden <1%. Total supraventricular ectopic burden <1%. Patient triggered events: 2.  Underlying rhythm sinus tachycardia without ectopy.  Stress Testing: No results found for this or any previous visit from the past 1095 days.   Heart Catheterization: None  LABORATORY DATA:    Latest Ref Rng & Units 05/07/2018    8:04 PM 05/07/2018    6:16 AM 05/06/2018    5:40 AM  CBC  WBC 4.0 - 10.5 K/uL 7.6  8.1  3.4   Hemoglobin 12.0 - 15.0 g/dL 9.8  9.4  16.1   Hematocrit 36.0 - 46.0 % 29.7  28.1  33.5   Platelets 150 - 400 K/uL 122  134  133         Latest Ref Rng & Units 05/07/2018    8:04 PM 01/28/2016   10:22 AM 03/22/2015   10:41 PM  CMP  Glucose 70 - 99 mg/dL 096  045  90   BUN 6 - 20 mg/dL Creatinine 0.44 - 1.00 mg/dL 4.09  8.11  9.14   Sodium 135 - 145 mmol/L 136  137  134   Potassium 3.5 - 5.1 mmol/L 3.7  3.8  3.7   Chloride 98 - 111 mmol/L 105  105  104   CO2 22 - 32 mmol/L Calcium 8.9 - 10.3 mg/dL 8.8  9.4  9.4   Total Protein 6.5 - 8.1 g/dL  8.2  7.2   Total Bilirubin 0.3 - 1.2 mg/dL  0.7  0.4   Alkaline Phos 38 - 126 U/L  60  68   AST 15 - 41  U/L  25  20   ALT 14 - 54 U/L  19  21     Lipid Panel  No results found for: "CHOL", "TRIG", "HDL", "CHOLHDL", "VLDL", "LDLCALC", "LDLDIRECT", "LABVLDL"  No components found for: "NTPROBNP" No results for input(s): "PROBNP" in the last 8760 hours. No results for input(s): "TSH" in the last 8760 hours.  BMP No results for input(s): "NA", "K", "CL", "CO2", "GLUCOSE", "BUN", "CREATININE", "CALCIUM", "GFRNONAA", "GFRAA" in the last 8760 hours.  HEMOGLOBIN A1C Lab Results  Component Value Date   HGBA1C 5.6 11/19/2017   External Labs: Collected: 08/06/2021. TSH 2.02 Hemoglobin 13.2, hematocrit 40.5%. BUN 11, creatinine 0.89. eGFR 86. Sodium 139, potassium 4, chloride 106, bicarb 22. AST 13, ALT 11, alkaline phosphatase 55 Total cholesterol 235, triglycerides 91, HDL 71, LDL 144, non-HDL 164   IMPRESSION:    ICD-10-CM   1. Palpitations  R00.2        RECOMMENDATIONS: Megan Coleman is a 37 y.o. African-American female whose past medical history and cardiac risk factors include: Migraine sickle cell trait, fibroids, endometriosis.  Palpitations No identifiable reversible cause No underlying near-syncope or syncopal events Outside labs from February 2023 independently reviewed TSH and H&H are still pending - she will have it w/ her PCP.  Zio patch report reviewed with the patient. Shared decision is to hold off additional  testing as her symptoms are stab/e / improving.  Will see her back in 1  year or sooner if change in clinical status.    FINAL MEDICATION LIST END OF ENCOUNTER: No orders of the defined types were placed in this encounter.   There are no discontinued medications.   Current Outpatient Medications:    Fremanezumab-vfrm (AJOVY) 225 MG/1.5ML SOAJ, Inject 225 mg into the skin every 30 (thirty) days., Disp: 1.68 mL, Rfl: 11   gabapentin (NEURONTIN) 400 MG capsule, Take 1 capsule (400 mg total) by mouth 3 (three) times daily., Disp: 270 capsule, Rfl: 1   topiramate (TOPAMAX) 100 MG tablet, Take 1 tablet (100 mg total) by mouth 2 (two) times daily., Disp: 180 tablet, Rfl: 3  No orders of the defined types were placed in this encounter.   There are no Patient Instructions on file for this visit.   --Continue cardiac medications as reconciled in final medication list. --Return in about 1 year (around 10/04/2023) for Reevaluation of, Palpitations. or sooner if needed. --Continue follow-up with your primary care physician regarding the management of your other chronic comorbid conditions.  Patient's questions and concerns were addressed to her satisfaction. She voices understanding of the instructions provided during this encounter.   This note was created using a voice recognition software as a result there may be grammatical errors inadvertently enclosed that do not reflect the nature of this encounter. Every attempt is made to correct such errors.  Tessa Lerner, [place], [facility]  Pager:  [phone] Office: [phone]

## 2022-10-23 ENCOUNTER — Encounter: Payer: Self-pay | Admitting: Neurology

## 2022-10-24 ENCOUNTER — Telehealth: Payer: Self-pay

## 2022-10-24 NOTE — Telephone Encounter (Signed)
Called pt. Informed her of message nurse April sent. Pt said thank you so much.

## 2022-10-24 NOTE — Telephone Encounter (Signed)
PA has been approved for Ajovy from 10/23/22-10/22/2023. Reference [id number]

## 2023-01-20 ENCOUNTER — Ambulatory Visit (INDEPENDENT_AMBULATORY_CARE_PROVIDER_SITE_OTHER): Payer: 59 | Admitting: Neurology

## 2023-01-20 ENCOUNTER — Encounter: Payer: Self-pay | Admitting: Neurology

## 2023-01-20 VITALS — BP 105/65 | HR 84 | Ht 67.0 in | Wt 162.0 lb

## 2023-01-20 DIAGNOSIS — R519 Headache, unspecified: Secondary | ICD-10-CM

## 2023-01-20 DIAGNOSIS — G43909 Migraine, unspecified, not intractable, without status migrainosus: Secondary | ICD-10-CM | POA: Diagnosis not present

## 2023-01-20 DIAGNOSIS — G8929 Other chronic pain: Secondary | ICD-10-CM

## 2023-01-20 MED ORDER — GABAPENTIN 400 MG PO CAPS: 400.0000 mg | ORAL_CAPSULE | Freq: Three times a day (TID) | ORAL | 3 refills | Status: AC

## 2023-01-20 MED ORDER — TOPIRAMATE 100 MG PO TABS: 100.0000 mg | ORAL_TABLET | Freq: Two times a day (BID) | ORAL | 3 refills | Status: AC

## 2023-01-20 MED ORDER — AJOVY 225 MG/1.5ML ~~LOC~~ SOAJ: 225.0000 mg | SUBCUTANEOUS | 11 refills | Status: AC

## 2023-01-20 NOTE — Patient Instructions (Signed)
Great to see you today.  I am glad you are doing well!  We will continue current medications.  If your headaches continue to remain under good control we may consider reducing some of the medication.  Let me know.  Otherwise see you in 1 year.  Thanks!!

## 2023-01-20 NOTE — Progress Notes (Signed)
Patient: Megan Coleman Date of Birth: 13-Mar-1986  Reason for Visit: Follow up left-sided headache History from: Patient Primary Neurologist: Dr. Terrace Arabia   ASSESSMENT AND PLAN 37 y.o. year old female   1.  Persistent left-sided headache, since fall in January 2018 2.  Episodic Migraine  -Currently very pleased with her headache control, wishes to continue current regimen, headaches are significantly improved -Continue gabapentin 400 mg 3 times daily, recommend we try to reduce to twice daily, wishes to stay as is -Continue Ajovy for headache prevention -Continue Topamax 100 mg twice a day -Has previously tried indomethacin, amitriptyline, gabapentin, Topamax, Fioricet, Emgality  -MRI of the brain, ESR, CRP were normal -Follow-up in 1 year or sooner if needed, refills sent in  Meds ordered this encounter  Medications   topiramate (TOPAMAX) 100 MG tablet    Sig: Take 1 tablet (100 mg total) by mouth 2 (two) times daily.    Dispense:  180 tablet    Refill:  3   gabapentin (NEURONTIN) 400 MG capsule    Sig: Take 1 capsule (400 mg total) by mouth 3 (three) times daily.    Dispense:  270 capsule    Refill:  3   Fremanezumab-vfrm (AJOVY) 225 MG/1.5ML SOAJ    Sig: Inject 225 mg into the skin every 30 (thirty) days.    Dispense:  1.68 mL    Refill:  11   HISTORY  Megan Coleman is a 37 year old female, seen in request by her primary care nurse practitioner Marva Panda for evaluation of headaches,   I have reviewed and summarized the referring note from the referring physician.  She began to have headaches since she fell in her bathtub in January 2018, struck her left temporal frontal region at the edge of the tub, she denied loss of consciousness, but the second day, she had a significant swelling on her left face, presented to the emergency room next day, x-ray showed no significant abnormality, but she had persistent left-sided headache ever since, she reported daily 7  out of 10 left-sided headaches, sometimes sensitivity of left temporal pain frontal skin, she denies visual loss, no swallowing difficulty, no lateralized motor or sensory deficit.   Update April 30, 2021 She continue has mild persistent left-sided pressure sensation, over the past few months, she was started on titrating dose of Emgality 120 mg monthly, gabapentin 300 mg 3 times a day, Topamax 150 mg daily, has helped her headache  Previously she was given a trial of indomethacin 25 mg 3 times a day without helping her headaches,   Personally reviewed MRI of the brain April 2022 that was normal   Laboratory evaluation showed normal ESR C-reactive protein,  Update Nov 06, 2021 Megan Coleman, has had 2 injections so far. Was on Emgality, likes Ajovy better. Feels pain is less severe. Still has headache to left side, but pain level is reduced. No side effect on topamax 100 mg twice daily(increased last visit). Also on gabapentin 300 mg 3 times daily. If pain increases will take Ibuprofen 1-2 times a week. The medications are really helping. Pain level is 4/10. This is good for her. Doesn't want to reduce the dose on gabapentin or Topamax. 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 Is back in school at [employer] getting her Master's in [employer]. Sometimes with headaches that are severe, can be sensitive to light and sound.   Update July 09, 2022 SS: Feels really good benefit from [place] for  1st 20 days, then can wear off, can feel more left pressure to left temple, sometimes around left mouth under left eye. Also on gabapentin 300 mg TID, Topamax 100 mg BID. May add in Tylenol if needed. No worsening of facial pain with talking or eating.   Update January 20, 2023 SS: Just got back from [place]. Migraines doing better, taking Topamax 100 mg twice daily, gabapentin 400 mg TID, Ajovy monthly. Headaches are constant but are much less severe. She is pleased, working full time. Doesn't need any  acute relief.   REVIEW OF SYSTEMS: Out of a complete 14 system review of symptoms, the patient complains only of the following symptoms, and all other reviewed systems are negative.  See HPI  ALLERGIES: No Known Allergies  HOME MEDICATIONS: Outpatient Medications Prior to Visit  Medication Sig Dispense Refill   Fremanezumab-vfrm (AJOVY) 225 MG/1.5ML SOAJ Inject 225 mg into the skin every 30 (thirty) days. (Patient taking differently: Inject 225 mg into the skin.) 1.68 mL 11   gabapentin (NEURONTIN) 400 MG capsule Take 1 capsule (400 mg total) by mouth 3 (three) times daily. 270 capsule 1   topiramate (TOPAMAX) 100 MG tablet Take 1 tablet (100 mg total) by mouth 2 (two) times daily. 180 tablet 3   No facility-administered medications prior to visit.    PAST MEDICAL HISTORY: Past Medical History:  Diagnosis Date   Abdominal wall mass of left lower quadrant, possible endometrioma 07/31/2011   Abscess    lower left abdomen/groin area   Anemia    Endometriosis of abdominal wall s/p excision [id number] 07/31/2011   Fibroid    Generalized headaches    Headache    Lymph nodes enlarged    Sickle cell trait (HCC)     PAST SURGICAL HISTORY: Past Surgical History:  Procedure Laterality Date   APPENDECTOMY  04/02/2009   In [place]   CESAREAN SECTION N/A 10/01/2013   Procedure: CESAREAN SECTION;  Surgeon: Levie Heritage, DO;  Location: [facility];  Service: Obstetrics;  Laterality: N/A;   CESAREAN SECTION WITH BILATERAL TUBAL LIGATION N/A 05/06/2018   Procedure: CESAREAN SECTION WITH BILATERAL TUBAL LIGATION;  Surgeon: Federico Flake, MD;  Location: [facility];  Service: Obstetrics;  Laterality: N/A;   LAPAROSCOPY  08/28/2011   Procedure: LAPAROSCOPY DIAGNOSTIC;  Surgeon: Ardeth Sportsman, MD;  Location: [facility];  Service: General;  Laterality: N/A;  Diagnostic Laparoscopy with Removal of Abdominal Wall Mass  Open Ventral Wall Hernia Repair with Mesh    MASS EXCISION  08/28/2011    Procedure: EXCISION MASS;  Surgeon: Ardeth Sportsman, MD;  Location: [facility];  Service: General;  Laterality: N/A;  Removal of Mass Abdominal Wall    MYOMECTOMY  07/03/2006   In [place]   VENTRAL HERNIA REPAIR  08/28/2011   Procedure: HERNIA REPAIR VENTRAL ADULT;  Surgeon: Ardeth Sportsman, MD;  Location: [facility];  Service: General;  Laterality: N/A;   Open Ventral Wall Hernia with Mesh     FAMILY HISTORY: Family History  Problem Relation Age of Onset   Healthy Mother    Healthy Father     SOCIAL HISTORY: Social History   Socioeconomic History   Marital status: Single    Spouse name: Not on file   Number of children: 3   Years of education: college   Highest education level: Bachelor's degree (e.g., BA, AB, BS)  Occupational History   Occupation: sales  Tobacco Use   Smoking status: Never  Smokeless tobacco: Never  Vaping Use   Vaping status: Never Used  Substance and Sexual Activity   Alcohol use: No   Drug use: No   Sexual activity: Yes    Birth control/protection: None  Other Topics Concern   Not on file  Social History Narrative   Originally from [place].   Lives at home with her children.   Right-handed.   No daily use of caffeine.   Social Determinants of Health   Financial Resource Strain: Low Risk  (04/28/2018)   Overall Financial Resource Strain (CARDIA)    Difficulty of Paying Living Expenses: Not hard at all  Food Insecurity: No Food Insecurity (04/28/2018)   Hunger Vital Sign    Worried About Running Out of Food in the Last Year: Never true    Ran Out of Food in the Last Year: Never true  Transportation Needs: Unknown (04/28/2018)   PRAPARE - Administrator, Civil Service (Medical): No    Lack of Transportation (Non-Medical): Not on file  Physical Activity: Not on file  Stress: No Stress Concern Present (04/28/2018)   [employer] of Occupational Health - Occupational Stress Questionnaire    Feeling of Stress : Not at all   Social Connections: Not on file  Intimate Partner Violence: Not At Risk (04/28/2018)   Humiliation, Afraid, Rape, and Kick questionnaire    Fear of Current or Ex-Partner: No    Emotionally Abused: No    Physically Abused: No    Sexually Abused: No    PHYSICAL EXAM  Vitals:   01/20/23 0818  BP: 105/65  Pulse: 84  Weight: 162 lb (73.5 kg)  Height: 5\' 7"  (1.702 m)   Body mass index is 25.37 kg/m.  Generalized: Well developed, in no acute distress  Neurological examination  Mentation: Alert oriented to time, place, history taking. Follows all commands speech and language fluent Cranial nerve II-XII: Pupils were equal round reactive to light. Extraocular movements were full, visual field were full on confrontational test. Facial sensation and strength were normal.  Head turning and shoulder shrug  were normal and symmetric. Motor: The motor testing reveals 5 over 5 strength of all 4 extremities. Good symmetric motor tone is noted throughout.  Sensory: Soft touch to left face is sensitive Coordination: Cerebellar testing reveals good finger-nose-finger and heel-to-shin bilaterally.  Gait and station: Gait is normal.  Reflexes: Deep tendon reflexes are symmetric and normal bilaterally.   DIAGNOSTIC DATA (LABS, IMAGING, TESTING) - I reviewed patient records, labs, notes, testing and imaging myself where available.  Lab Results  Component Value Date   WBC 7.6 05/07/2018   HGB 9.8 (L) 05/07/2018   HCT 29.7 (L) 05/07/2018   MCV 84.9 05/07/2018   PLT 122 (L) 05/07/2018      Component Value Date/Time   NA 136 05/07/2018 2004   K 3.7 05/07/2018 2004   CL 105 05/07/2018 2004   CO2 22 05/07/2018 2004   GLUCOSE 107 (H) 05/07/2018 2004   BUN 8 05/07/2018 2004   CREATININE 0.78 05/07/2018 2004   CALCIUM 8.8 (L) 05/07/2018 2004   PROT 8.2 (H) 01/28/2016 1022   ALBUMIN 4.6 01/28/2016 1022   AST 25 01/28/2016 1022   ALT 19 01/28/2016 1022   ALKPHOS 60 01/28/2016 1022   BILITOT 0.7  01/28/2016 1022   GFRNONAA >60 05/07/2018 2004   GFRAA >60 05/07/2018 2004   No results found for: "CHOL", "HDL", "LDLCALC", "LDLDIRECT", "TRIG", "CHOLHDL" Lab Results  Component Value Date  HGBA1C 5.6 11/19/2017   Lab Results  Component Value Date   VITAMINB12 522 04/09/2018   No results found for: "TSH"  Margie Ege, AGNP-C, DNP 01/20/2023, 8:34 AM [facility] [address] [place], [place] [address] [phone]

## 2023-01-23 ENCOUNTER — Ambulatory Visit: Payer: 59 | Admitting: Neurology

## 2023-09-03 ENCOUNTER — Encounter: Payer: Self-pay | Admitting: Neurology

## 2023-10-03 ENCOUNTER — Ambulatory Visit: Payer: Self-pay | Admitting: Cardiology

## 2024-01-20 ENCOUNTER — Ambulatory Visit: Payer: 59 | Admitting: Neurology

## 2024-07-01 ENCOUNTER — Ambulatory Visit: Admitting: Neurology

## 2024-07-01 ENCOUNTER — Telehealth: Payer: Self-pay | Admitting: Neurology

## 2024-07-01 NOTE — Telephone Encounter (Signed)
 Request to cancel appointment , due to conflict.
